# Patient Record
Sex: Female | Born: 1990
Health system: Southern US, Community
[De-identification: ages and names within clinical notes are randomized; demographics above are authoritative.]

## PROBLEM LIST (undated history)

## (undated) DIAGNOSIS — M419 Scoliosis, unspecified: Secondary | ICD-10-CM

## (undated) DIAGNOSIS — T8859XA Other complications of anesthesia, initial encounter: Secondary | ICD-10-CM

## (undated) DIAGNOSIS — I456 Pre-excitation syndrome: Secondary | ICD-10-CM

## (undated) DIAGNOSIS — F32A Depression, unspecified: Secondary | ICD-10-CM

## (undated) DIAGNOSIS — F988 Other specified behavioral and emotional disorders with onset usually occurring in childhood and adolescence: Secondary | ICD-10-CM

## (undated) DIAGNOSIS — D170 Benign lipomatous neoplasm of skin and subcutaneous tissue of head, face and neck: Secondary | ICD-10-CM

## (undated) DIAGNOSIS — Z9889 Other specified postprocedural states: Secondary | ICD-10-CM

## (undated) DIAGNOSIS — A419 Sepsis, unspecified organism: Secondary | ICD-10-CM

## (undated) DIAGNOSIS — F329 Major depressive disorder, single episode, unspecified: Secondary | ICD-10-CM

## (undated) DIAGNOSIS — R112 Nausea with vomiting, unspecified: Secondary | ICD-10-CM

## (undated) HISTORY — PX: COLPOSCOPY: SHX161

## (undated) HISTORY — DX: Pre-excitation syndrome: I45.6

## (undated) HISTORY — DX: Other specified behavioral and emotional disorders with onset usually occurring in childhood and adolescence: F98.8

## (undated) HISTORY — DX: Depression, unspecified: F32.A

## (undated) HISTORY — PX: WISDOM TOOTH EXTRACTION: SHX21

## (undated) HISTORY — DX: Scoliosis, unspecified: M41.9

## (undated) HISTORY — DX: Major depressive disorder, single episode, unspecified: F32.9

---
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
## 2011-01-28 LAB — HM PAP SMEAR

## 2011-03-17 ENCOUNTER — Telehealth: Payer: Self-pay

## 2011-03-17 NOTE — Telephone Encounter (Signed)
Pt in need of dextroamphetamine refill.  Pharmacy choice [facility] on [address].

## 2011-03-17 NOTE — Telephone Encounter (Signed)
Per chart, pt is ok for 2 more months.  Can we rx?

## 2011-03-18 ENCOUNTER — Telehealth: Payer: Self-pay

## 2011-03-18 NOTE — Telephone Encounter (Signed)
Notified pt request is complete and ready.

## 2011-03-18 NOTE — Telephone Encounter (Signed)
LMOM at home and cell that Rx ready to p/up.

## 2011-03-18 NOTE — Telephone Encounter (Signed)
Dextroamphetamine ER #[id number], 2 po BID written.  Please call pt to pick up.

## 2011-04-21 ENCOUNTER — Telehealth: Payer: Self-pay

## 2011-04-21 NOTE — Telephone Encounter (Signed)
Pt would like a refill on dextroamphetamine. Pt will pick up.

## 2011-04-22 ENCOUNTER — Telehealth: Payer: Self-pay

## 2011-04-22 MED ORDER — AMPHETAMINE-DEXTROAMPHET ER 10 MG PO CP24: 20.0000 mg | ORAL_CAPSULE | Freq: Two times a day (BID) | ORAL | Status: DC

## 2011-04-22 NOTE — Telephone Encounter (Signed)
Signed order for meds

## 2011-04-22 NOTE — Telephone Encounter (Signed)
Added this message with new message 04/22/11

## 2011-04-22 NOTE — Telephone Encounter (Signed)
This is a previous message Suzanne [name] March 04/21/2011 1:32 PM Signed  Pt would like a refill on dextroamphetamine.  Pt will pick up.

## 2011-04-22 NOTE — Telephone Encounter (Signed)
[facility]  PT STATES SHE IS COMING FROM [place] AND IS IN NEED OF HER DEXTO 4:00 PLEASE CALL [phone]

## 2011-04-22 NOTE — Telephone Encounter (Signed)
Pt seen for this 11/21/10, last RF 02/14/10. Pt is in lobby waiting for this

## 2011-04-23 ENCOUNTER — Telehealth: Payer: Self-pay

## 2011-04-23 NOTE — Telephone Encounter (Signed)
Patient picked up RX yesterday and it was wrong. Should have been regular but it was generic. Wants [facility]generic [facility] [place] [facility] she lives. Phone # is [phone].

## 2011-04-24 MED ORDER — DEXTROAMPHETAMINE SULFATE ER 10 MG PO CP24: 20.0000 mg | ORAL_CAPSULE | Freq: Two times a day (BID) | ORAL | Status: DC

## 2011-04-24 NOTE — Telephone Encounter (Signed)
Left message to call back  

## 2011-04-24 NOTE — Telephone Encounter (Signed)
Pt was accidentally given Adderall and she had them filled and will take then until she returns to [place] - at that time she will return the unused portion of adderall to [place] for disposal and she can get a new RX for the correct medication, dexedrine ER.  She will need an OV 4/13.

## 2011-04-24 NOTE — Telephone Encounter (Signed)
Pt reports she already had the Adderall filled, a friend p/up for her and didn't realize that it wasn't the Dextroamphetamine ER. Pt will take the amphetamine salts (Adderall) for now and should be in town in about 7 days. If we can write her a new Rx for the correct medication, she will bring what is left of her Adderall Rx and give it to [place] to exchange for the month's script for her Dextro.

## 2011-05-24 ENCOUNTER — Telehealth: Payer: Self-pay

## 2011-05-24 NOTE — Telephone Encounter (Signed)
Patient is requesting a refill on dextroanphetamine please call once it has been written she needs to pick this up  Best number [phone]

## 2011-05-25 NOTE — Telephone Encounter (Signed)
LMOM TO CB.  PT NEEDS OFFICE VISIT

## 2011-05-26 NOTE — Telephone Encounter (Signed)
Advised pt that she would need an office visit

## 2011-05-28 ENCOUNTER — Ambulatory Visit (INDEPENDENT_AMBULATORY_CARE_PROVIDER_SITE_OTHER): Payer: BC Managed Care – PPO | Admitting: Internal Medicine

## 2011-05-28 VITALS — BP 115/70 | HR 112 | Temp 98.1°F | Resp 16 | Ht 65.5 in | Wt 99.8 lb

## 2011-05-28 DIAGNOSIS — F988 Other specified behavioral and emotional disorders with onset usually occurring in childhood and adolescence: Secondary | ICD-10-CM

## 2011-05-28 DIAGNOSIS — F329 Major depressive disorder, single episode, unspecified: Secondary | ICD-10-CM

## 2011-05-28 DIAGNOSIS — F909 Attention-deficit hyperactivity disorder, unspecified type: Secondary | ICD-10-CM

## 2011-05-28 MED ORDER — AMPHETAMINE-DEXTROAMPHET ER 10 MG PO CP24: 10.0000 mg | ORAL_CAPSULE | ORAL | Status: DC

## 2011-05-28 MED ORDER — ESCITALOPRAM OXALATE 10 MG PO TABS: 10.0000 mg | ORAL_TABLET | Freq: Every day | ORAL | Status: DC

## 2011-05-28 NOTE — Patient Instructions (Signed)
Take meds as directed see a therapist in followup in [place]. If you feel that you are are danger to yourself or anyone else return to the er

## 2011-05-28 NOTE — Progress Notes (Signed)
  Subjective:    Patient ID: Suzanne [name], female    DOB: 04-17-1990, 21 y.o.   MRN: [id number]  HPI Here for refill for adhd meds. Comes monthly. Also wants refill of lexapro has not taken it for a whild but has been dysphoric. Not suicidal or homocidal  Review of Systems  Constitutional: Negative.   HENT: Negative.   Respiratory: Negative.   Gastrointestinal: Negative.   Genitourinary: Negative.   Musculoskeletal: Negative.   Neurological: Negative.   Psychiatric/Behavioral: Positive for dysphoric mood. Negative for behavioral problems, confusion, decreased concentration and agitation.  All other systems reviewed and are negative.       Objective:   Physical Exam  Nursing note and vitals reviewed. Constitutional: She is oriented to person, place, and time. She appears well-developed and well-nourished.  HENT:  Head: Normocephalic and atraumatic.  Right Ear: External ear normal.  Left Ear: External ear normal.  Eyes: Conjunctivae and EOM are normal. Pupils are equal, round, and reactive to light.  Neck: Normal range of motion. Neck supple.  Cardiovascular: Normal rate, regular rhythm and normal heart sounds.   Pulmonary/Chest: Effort normal and breath sounds normal.  Abdominal: Soft. Bowel sounds are normal.  Musculoskeletal: Normal range of motion.  Neurological: She is alert and oriented to person, place, and time. She has normal reflexes.  Skin: Skin is warm and dry.  Psychiatric: She has a normal mood and affect. Her behavior is normal. Judgment and thought content normal.          Assessment & Plan:  Renew amphetamines restart lexapro to followup with therapist in [place]

## 2011-05-28 NOTE — Progress Notes (Signed)
Addended by: Glennie Isle on: 05/28/2011 08:38 PM   Modules accepted: Orders

## 2011-06-20 ENCOUNTER — Other Ambulatory Visit: Payer: Self-pay

## 2011-06-20 DIAGNOSIS — F909 Attention-deficit hyperactivity disorder, unspecified type: Secondary | ICD-10-CM

## 2011-06-20 DIAGNOSIS — F329 Major depressive disorder, single episode, unspecified: Secondary | ICD-10-CM

## 2011-06-20 NOTE — Telephone Encounter (Signed)
Pt is requesting on narcotic dextra actephenimine

## 2011-06-20 NOTE — Telephone Encounter (Signed)
Spoke with patient and notified below.  She states that she lives in [place] and is only able to come into town once a month.  She does not have drivers license and father will bring her in on Monday/Tuesday.  Can we rx this with later fill date and have her pick up on Monday?  Patient also states that she does not have a therapist in [place], and doesn't plan on getting one.  She wants [place] to continue rxing her meds.  To PA.

## 2011-06-20 NOTE — Telephone Encounter (Signed)
Too early for refill  

## 2011-06-21 MED ORDER — AMPHETAMINE-DEXTROAMPHET ER 10 MG PO CP24: 10.0000 mg | ORAL_CAPSULE | ORAL | Status: DC

## 2011-06-21 MED ORDER — ESCITALOPRAM OXALATE 10 MG PO TABS: 10.0000 mg | ORAL_TABLET | Freq: Every day | ORAL | Status: DC

## 2011-06-21 NOTE — Telephone Encounter (Signed)
Rx for Adderall XR 10 mg and Lexapro 10 mg printed.  Patient will need to follow-up here in 3 months, or establish for care in [facility].

## 2011-06-21 NOTE — Telephone Encounter (Signed)
[facility] notifying patient below, and rx's in pickup drawer.

## 2011-06-22 ENCOUNTER — Telehealth: Payer: Self-pay

## 2011-06-22 DIAGNOSIS — F909 Attention-deficit hyperactivity disorder, unspecified type: Secondary | ICD-10-CM

## 2011-06-22 DIAGNOSIS — F329 Major depressive disorder, single episode, unspecified: Secondary | ICD-10-CM

## 2011-06-22 NOTE — Telephone Encounter (Signed)
Pt was prescribed the wrong dosage of dextroamphetamine. Pt states she does not take Adderall because it doesn't work as well as the dextroamphetamine. Pt states that she was only given #30 but would like to have #120 because she sometimes takes TID. Chart at nurses station.

## 2011-06-23 NOTE — Telephone Encounter (Signed)
Yes we can correct this but I need to know if she has already filled the Adderall that was written on 06/21/11? Or did she notice this on just the hard copy?  Whatever she has she needs to bring in so we can discard either the tablets or the hard copy RX.

## 2011-06-24 NOTE — Telephone Encounter (Signed)
Left message for patient to return call.

## 2011-06-25 NOTE — Telephone Encounter (Signed)
Pt wants her rx to say dextroamphetamine er 10mg  tid.  Her chart is at nurses station for review and she never picked up rx's from 5/10. [place] [id number]

## 2011-06-26 MED ORDER — ESCITALOPRAM OXALATE 10 MG PO TABS: 10.0000 mg | ORAL_TABLET | Freq: Every day | ORAL | Status: DC

## 2011-06-26 MED ORDER — AMPHETAMINE-DEXTROAMPHET ER 10 MG PO CP24: ORAL_CAPSULE | ORAL | Status: DC

## 2011-06-26 NOTE — Telephone Encounter (Signed)
Advised pt that rx is ready for pick up.

## 2011-06-28 ENCOUNTER — Telehealth: Payer: Self-pay

## 2011-06-28 NOTE — Telephone Encounter (Signed)
[facility] calling to let nurse or Dr know that a rx auth needs to be filled out for pt adderall xr 10mg  3 TID insurance will only allow 1 a day. Please call [phone]

## 2011-07-01 NOTE — Telephone Encounter (Signed)
Prior auth form filled out and faxed into [employer] [place].

## 2011-07-26 ENCOUNTER — Telehealth: Payer: Self-pay

## 2011-07-26 DIAGNOSIS — F909 Attention-deficit hyperactivity disorder, unspecified type: Secondary | ICD-10-CM

## 2011-07-26 NOTE — Telephone Encounter (Signed)
PT IN NEED OF HER ADDERALL,. PLEASE CALL [phone] WHEN READY AND WOULD LIKE TO HAVE BETWEEN Sunday AND Tuesday

## 2011-07-27 MED ORDER — AMPHETAMINE-DEXTROAMPHET ER 10 MG PO CP24: ORAL_CAPSULE | ORAL | Status: DC

## 2011-07-27 NOTE — Telephone Encounter (Signed)
RX refilled and is at [facility]

## 2011-07-28 NOTE — Telephone Encounter (Signed)
LMOM THAT RX IS READY FOR PICKUP 

## 2011-08-05 ENCOUNTER — Telehealth: Payer: Self-pay

## 2011-08-05 NOTE — Telephone Encounter (Signed)
PT STATES SHE HAVE BEEN HAVING TROUBLE WITH [place] CALLING IN HER MEDICINE CORRECTLY AND NEED THIS DONE TODAY STATES IT IS THE DEXTRO AND WE KEEP WRITING IT FOR THE ADDERALL. PLEASE CALL PT AT [phone]    [facility] [place] AT [phone]

## 2011-08-06 MED ORDER — DEXTROAMPHETAMINE SULFATE ER 10 MG PO CP24: ORAL_CAPSULE | ORAL | Status: DC

## 2011-08-06 NOTE — Telephone Encounter (Signed)
Done and ready for pick up

## 2011-08-06 NOTE — Telephone Encounter (Signed)
Corrected Rx is ready for p/up. Tried to call pt - H# has been disconnected and cell VM was full. Try to call pt again and make sure she doesn't want [place] to mail it since she has enough Adderall for 10 days.

## 2011-08-06 NOTE — Telephone Encounter (Signed)
Advised pt that Rx is corrected and ready. Pt does request Rx be mailed to her at her brothers apt in [place] - [address], [place], [address]. Mailed Rx.

## 2011-08-06 NOTE — Telephone Encounter (Signed)
Spoke w/ pt who is aware that she has been getting the Adderall ER (dextro-amphetamine comb) for several months and hasn't wanted to make the trip back from [place] to p/up a correction. She really does want to return to the plain Dextroamphetamine ER that she was taking before and works better for her. Called her pharmacy to ask about the difference and how we can put in computer to get her the correct info. He stated that the correct Rx would be for Dextroamphetamine sulfate ER or Dexedrine Spansules (the Spansules are the ER capsule) as long as we sign to allow substitution of generic. Pt p/up #30 (10 days worth) of the Adderall on 08/05/11 to last her until we can get the Rx straightened out. [place] [id number] if needed.

## 2011-09-09 ENCOUNTER — Telehealth: Payer: Self-pay

## 2011-09-09 NOTE — Telephone Encounter (Signed)
The patient called to request refill of Dexedrine Sansule 10mg  Rx.  The patient requests that Rx be mailed to her home address as she is living in [place], [place] and does not have a vehicle to get to [place] at this time.  Please call patient at [phone] with any questions.

## 2011-09-11 MED ORDER — DEXTROAMPHETAMINE SULFATE ER 10 MG PO CP24: 20.0000 mg | ORAL_CAPSULE | Freq: Two times a day (BID) | ORAL | Status: DC

## 2011-09-11 NOTE — Telephone Encounter (Signed)
Spoke w/pt to verify what address Rx needs to be mailed to which is [address], [place] [address]. Reminded pt that she does need to plan to get to town in order to have OV w/provider Q 6 mos which would be before her Oct RF is due. Pt agreed and stated she can get here that often, it is just hard to come Q mos to p/up Rx. Mailed Rx to address above.

## 2011-09-11 NOTE — Telephone Encounter (Signed)
Rx printed and ready to be mailed.  If she is going to be living in [facility] and unable to come here for follow up, she will need to find a PCP there who can rx her medication.

## 2011-10-16 ENCOUNTER — Telehealth: Payer: Self-pay

## 2011-10-16 NOTE — Telephone Encounter (Signed)
PT IN NEED OF HER DEXTRO BEING MAILED TO HER. PLEASE CALL [phone]

## 2011-10-17 ENCOUNTER — Other Ambulatory Visit: Payer: Self-pay | Admitting: Physician Assistant

## 2011-10-17 DIAGNOSIS — F909 Attention-deficit hyperactivity disorder, unspecified type: Secondary | ICD-10-CM

## 2011-10-17 MED ORDER — AMPHETAMINE-DEXTROAMPHET ER 10 MG PO CP24: ORAL_CAPSULE | ORAL | Status: DC

## 2011-10-17 NOTE — Telephone Encounter (Signed)
I have called her to see what medication she is in need of. Left message for call back.

## 2011-10-17 NOTE — Telephone Encounter (Signed)
Dexedrine Sansule 10mg  Rx patient states she is taking 2 bid, but our last Rx is written for one tid. Please advise on renewal

## 2011-10-17 NOTE — Telephone Encounter (Signed)
Called pt to advise RX being mailed, [facility] to notify pt

## 2011-10-17 NOTE — Telephone Encounter (Signed)
Rx is ready to mail.  The last Rx was for tid use, so that's what I can approve.

## 2011-10-26 ENCOUNTER — Telehealth: Payer: Self-pay

## 2011-10-26 DIAGNOSIS — F909 Attention-deficit hyperactivity disorder, unspecified type: Secondary | ICD-10-CM

## 2011-10-26 NOTE — Telephone Encounter (Signed)
[address] [place] [address]  [id number]  Please mail another RX to patient at this address:   Not the one in the computer.  adderall rx

## 2011-10-27 MED ORDER — AMPHETAMINE-DEXTROAMPHET ER 10 MG PO CP24: ORAL_CAPSULE | ORAL | Status: DC

## 2011-10-27 NOTE — Telephone Encounter (Signed)
At TL desk 

## 2011-10-27 NOTE — Telephone Encounter (Signed)
Done and mailed

## 2011-11-01 ENCOUNTER — Telehealth: Payer: Self-pay | Admitting: Radiology

## 2011-11-01 NOTE — Telephone Encounter (Signed)
We will consider the change once we get the original Adderall Rx back. We cannot write for "extra" tabs. I will not write controlled substances for this patient.

## 2011-11-01 NOTE — Telephone Encounter (Signed)
Patient needs Dexidrine, not Adderall mailed to  [address] [place]. [address]/ She has advised her insurance does not cover the Adderall. She will mail me the Adderall back, I advised if not mailed back, we could file a report. She wanted Dexidrine written for 4 a day instead of 3 a day so she could have extra, I advised her this can not be done, we have to write as she is directed to take which is three a day.

## 2011-11-06 NOTE — Telephone Encounter (Signed)
If you get mail from this patient please get this to me so I can work on alternative medication for her.

## 2011-11-08 NOTE — Telephone Encounter (Signed)
We have not received this in the mail yet but will keep my eyes open for it.

## 2011-11-12 MED ORDER — DEXTROAMPHETAMINE SULFATE ER 10 MG PO CP24: ORAL_CAPSULE | ORAL | Status: DC

## 2011-11-12 NOTE — Telephone Encounter (Signed)
Mailed her the Rx, called patient and advised she is due for follow up, and it may be best if she gets this done at [facility]. She is angry her Rx was not mailed to her last week. I advised her I had to wait until the Rx for Adderall was received. She advised she will not return here. I have advised her to let us know if her medical records need to be sent, our medical records department would be happy to send these for her. FYI only

## 2011-11-12 NOTE — Telephone Encounter (Signed)
Patient has mailed back the Rx for Adderall, I have torn it up and put in shred box. Please advise if we can mail Rx for her Dexedrine Spansules 10mg , tid. Also there has been discussion about patient finding a provider in [place], as she now lives there. She is due for follow up in Oct.

## 2011-11-12 NOTE — Telephone Encounter (Signed)
I have written the Rx for tid.  She will need to find a provider in [place] for her next Rx.

## 2012-04-01 ENCOUNTER — Ambulatory Visit (INDEPENDENT_AMBULATORY_CARE_PROVIDER_SITE_OTHER): Payer: BC Managed Care – PPO | Admitting: Family Medicine

## 2012-04-01 VITALS — HR 120 | Temp 98.0°F | Resp 18 | Ht 65.5 in | Wt 107.0 lb

## 2012-04-01 DIAGNOSIS — F988 Other specified behavioral and emotional disorders with onset usually occurring in childhood and adolescence: Secondary | ICD-10-CM

## 2012-04-01 DIAGNOSIS — Z309 Encounter for contraceptive management, unspecified: Secondary | ICD-10-CM

## 2012-04-01 MED ORDER — DEXTROAMPHETAMINE SULFATE ER 10 MG PO CP24: 20.0000 mg | ORAL_CAPSULE | Freq: Two times a day (BID) | ORAL | Status: DC

## 2012-04-01 MED ORDER — MEDROXYPROGESTERONE ACETATE 150 MG/ML IM SUSP
150.0000 mg | Freq: Once | INTRAMUSCULAR | Status: AC
  Administered 2012-04-01: 150 mg via INTRAMUSCULAR

## 2012-04-01 NOTE — Patient Instructions (Signed)
Recheck in 3 months for repeat depoprovera and to discuss ADD medicine.  Return to the clinic or go to the nearest emergency room if any of your symptoms worsen or new symptoms occur. [facility] Policy for Prescribing Controlled Substances (Revised 12/2011) 1. Prescriptions for controlled substances will be filled by ONE provider at [facility] with whom you have established and developed a plan for your care, including follow-up. 2. You are encouraged to schedule an appointment with your prescriber at our appointment center for follow-up visits whenever possible. 3. If you request a prescription for the controlled substance while at [facility] for an acute problem (with someone other than your regular prescriber), you MAY be given a ONE-TIME prescription for a 30-day supply of the controlled substance, to allow time for you to return to see your regular prescriber for additional prescriptions.

## 2012-04-01 NOTE — Progress Notes (Signed)
Subjective:    Patient ID: Suzanne [name], female    DOB: 28-Feb-1990, 21 y.o.   MRN: [id number]  HPI [name] is a 22 y.o. female  Moved back to [place] few weeks ago form [place]. Broke up with boyfriend - happy about this. Plans on continuing care here.   Hx of ADHD - last ov 05/2011 with Dr. Mindi Junker. Multiple providers rx med here prior at her office visits.  Phone note from September indicated she was planning on receiving med/care elsewhere.  Takes detroamphetamine 10mg  tid prn - last rx on [facility] 11/12/11 for #90, but on controlled substance database - #120 on 02/18/12 prescribed by Helyn App at [facility] in [place]. (prior to that 12/31/11). Feels like 2 pills twice per day is a good dose - 2 in the morning, then 1 or 2 at lunch. Feels like focus good on this.  Had 2 jobs in [place].  Working well.  No insomnia. Normal appetite - no weight loss.    Also noted depression at 05/2011 office visit.  Restarted Lexapro at that time for 1 month.  Didn't feel like needed it. No depressed mood, no SI, no anhedonia. Feels like mood is good.  Alcohol - once per week - 2-3 drinks. Decreased consumption.  No recent DUI or problems with work due to alcohol.  DUI 2 years ago.  Tobacco 2 cigs/day. No marijuana or other IDU.   Contraceptive couseling - here for Depo injection.  Had prescribed form doctor in [facility] - has with her - picked up yesterday. Last injected in November - 11/1/713.  Does not have breakthrough bleeding/menses on this medicine. Due for repeat injection 2 days ago.  No recent unprotected intercourse. Has rx with her, but unable to go back to [facility] to have injected.    Review of Systems  Constitutional: Negative for chills and unexpected weight change.  Respiratory: Negative for chest tightness.   Cardiovascular: Negative for chest pain and palpitations.  Genitourinary: Negative for vaginal bleeding and menstrual problem.  Psychiatric/Behavioral:  Negative for suicidal ideas, sleep disturbance, dysphoric mood and decreased concentration. The patient is not nervous/anxious and is not hyperactive.    As above.     Objective:   Physical Exam  Constitutional: She is oriented to person, place, and time. She appears well-developed and well-nourished.  HENT:  Head: Normocephalic and atraumatic.  Cardiovascular: Normal rate, regular rhythm, normal heart sounds and intact distal pulses.   Pulmonary/Chest: Effort normal and breath sounds normal.  Abdominal: Soft. There is no tenderness.  Neurological: She is alert and oriented to person, place, and time.  Skin: Skin is warm and dry.  Psychiatric: She has a normal mood and affect. Her behavior is normal. Judgment and thought content normal.      Assessment & Plan:  [name] is a 23 y.o. female ADD (attention deficit disorder) - controlled. No new side effects. csrs matches hx. No concerns. Advised of controlled substance policy. Plan: dextroamphetamine at same dose - 2 of 10mg  er twice per day.  Initial rx's with wrong instructions and one of prescriptions signed by other provider. Shred 5 erroneous prescriptions. Recheck in 3 months.   Other general counseling and advice for contraceptive management. Depo preovera given.   Unspecified contraceptive management - Plan: medroxyPROGESTERone (DEPO-PROVERA) injection 150 mg   Patient Instructions  Recheck in 3 months for repeat depoprovera and to discuss ADD medicine.  Return to the clinic or go to the nearest emergency  room if any of your symptoms worsen or new symptoms occur. [facility] Policy for Prescribing Controlled Substances (Revised 12/2011) 1. Prescriptions for controlled substances will be filled by ONE provider at [facility] with whom you have established and developed a plan for your care, including follow-up. 2. You are encouraged to schedule an appointment with your prescriber at our appointment center for follow-up visits whenever  possible. 3. If you request a prescription for the controlled substance while at [facility] for an acute problem (with someone other than your regular prescriber), you MAY be given a ONE-TIME prescription for a 30-day supply of the controlled substance, to allow time for you to return to see your regular prescriber for additional prescriptions.    Meds ordered this encounter  Medications  . DISCONTD: dextroamphetamine (DEXEDRINE SPANSULE) 10 MG 24 hr capsule    Sig: Take 2 capsules (20 mg total) by mouth 2 (two) times daily. Take one capsule three times daily as needed.    Dispense:  120 capsule    Refill:  0  . DISCONTD: dextroamphetamine (DEXEDRINE SPANSULE) 10 MG 24 hr capsule    Sig: Take 2 capsules (20 mg total) by mouth 2 (two) times daily. Take one capsule three times daily as needed.    Dispense:  120 capsule    Refill:  0    Do not fill before 04/29/12  . DISCONTD: dextroamphetamine (DEXEDRINE SPANSULE) 10 MG 24 hr capsule    Sig: Take 2 capsules (20 mg total) by mouth 2 (two) times daily. Take one capsule three times daily as needed.    Dispense:  120 capsule    Refill:  0    Do not fill before 05/30/12  . medroxyPROGESTERone (DEPO-PROVERA) injection 150 mg    Sig:   . DISCONTD: dextroamphetamine (DEXEDRINE SPANSULE) 10 MG 24 hr capsule    Sig: Take 2 capsules (20 mg total) by mouth 2 (two) times daily. Take one capsule three times daily as needed.    Dispense:  120 capsule    Refill:  0    Do not fill before 05/30/12  . DISCONTD: dextroamphetamine (DEXEDRINE SPANSULE) 10 MG 24 hr capsule    Sig: Take 2 capsules (20 mg total) by mouth 2 (two) times daily.    Dispense:  120 capsule    Refill:  0    Do not fill before 05/30/12  . DISCONTD: dextroamphetamine (DEXEDRINE SPANSULE) 10 MG 24 hr capsule    Sig: Take 2 capsules (20 mg total) by mouth 2 (two) times daily.    Dispense:  120 capsule    Refill:  0    Do not fill before 04/29/12  . dextroamphetamine (DEXEDRINE SPANSULE) 10 MG  24 hr capsule    Sig: Take 2 capsules (20 mg total) by mouth 2 (two) times daily.    Dispense:  120 capsule    Refill:  0

## 2012-06-08 ENCOUNTER — Telehealth: Payer: Self-pay | Admitting: Certified Nurse Midwife

## 2012-06-08 ENCOUNTER — Ambulatory Visit: Payer: BC Managed Care – PPO | Admitting: Gynecology

## 2012-06-08 NOTE — Telephone Encounter (Signed)
Pt would like an appt to evaluate vaginal bumps and dry skin.

## 2012-06-08 NOTE — Telephone Encounter (Signed)
pateint given appt. To be seen tomorrow with Dr. Farrel Gobble @ 11:00am

## 2012-06-09 ENCOUNTER — Encounter: Payer: Self-pay | Admitting: Gynecology

## 2012-06-09 ENCOUNTER — Ambulatory Visit (INDEPENDENT_AMBULATORY_CARE_PROVIDER_SITE_OTHER): Payer: BC Managed Care – PPO | Admitting: Gynecology

## 2012-06-09 VITALS — BP 100/60 | Resp 14 | Wt 101.0 lb

## 2012-06-09 DIAGNOSIS — R238 Other skin changes: Secondary | ICD-10-CM

## 2012-06-09 DIAGNOSIS — N760 Acute vaginitis: Secondary | ICD-10-CM

## 2012-06-09 DIAGNOSIS — L988 Other specified disorders of the skin and subcutaneous tissue: Secondary | ICD-10-CM

## 2012-06-09 LAB — POCT WET PREP (WET MOUNT): Clue Cells Wet Prep Whiff POC: NEGATIVE

## 2012-06-09 NOTE — Patient Instructions (Signed)
Recommend keeping area dry, loose fitting clothing, cotton underwear, condoms if sexually active.  Avoid sexual contact until area has healed and culture results are back.   Return to office if symptoms worsen

## 2012-06-09 NOTE — Progress Notes (Signed)
Subjective:     Patient ID: Suzanne [name], female   DOB: 1990/08/26, 22 y.o.   MRN: [id number]  HPI Comments: Pt reports vaginal itching and vulvar rash that began 10 ago.  Pt reports having unprotected sex with a new partner around that time.  Pt unsure regarding his STD risks.  Pt also reports recently not wearing underwear off and on last week, while wearing jeans.  Jeans were tight fitting and she is unsure regarding local abrasion.  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  Pt reports 3d history of chills, sore throat and fatigue,  But denies any oral sex.      Review of Systems  Constitutional: Positive for chills and fatigue. Negative for fever, activity change and appetite change.  HENT: Negative for nosebleeds, congestion and postnasal drip.   Respiratory: Negative.   Cardiovascular: Negative.   Gastrointestinal: Negative.   Genitourinary: Negative for dysuria, hematuria, genital sores and pelvic pain.  Musculoskeletal: Negative.        Objective:   Physical Exam  Constitutional: She appears well-developed and well-nourished.  Genitourinary:     Pelvic: cervix normal in appearance, external genitalia normal, no adnexal masses or tenderness, no bladder tenderness, no cervical motion tenderness, rectovaginal septum normal, uterus normal size, shape, and consistency and thick dischrage invault Vulva and mons shaved     Assessment:     Possible Herpetic outbreak vs local irritation due to improper hygiene  Wet prep negative    Plan:     HSV culture of lesion on mons HSV IGG IGM I/II    ADD:  Pt refused phlebototomy, stressed importance of knowing for sure, she is due for depo next week will try again

## 2012-06-29 ENCOUNTER — Ambulatory Visit (INDEPENDENT_AMBULATORY_CARE_PROVIDER_SITE_OTHER): Payer: BC Managed Care – PPO

## 2012-06-29 DIAGNOSIS — Z3040 Encounter for surveillance of contraceptives, unspecified: Secondary | ICD-10-CM

## 2012-06-29 MED ORDER — MEDROXYPROGESTERONE ACETATE 150 MG/ML IM SUSP
150.0000 mg | Freq: Once | INTRAMUSCULAR | Status: AC
  Administered 2012-06-29: 150 mg via INTRAMUSCULAR

## 2012-06-29 NOTE — Progress Notes (Signed)
Pt presented for Depo Provera injection.  Pt had AVS from [facility] documenting last injection.  Last injection given on 04/01/12.  Injection tolerated well in left gluteal.  Pt should return between 09/14/12-09/28/12 for next injection.  Pt supplied her own medication.  Pt adamantly declined venipuncture for HSV test.

## 2012-07-21 ENCOUNTER — Ambulatory Visit (INDEPENDENT_AMBULATORY_CARE_PROVIDER_SITE_OTHER): Payer: BC Managed Care – PPO | Admitting: Family Medicine

## 2012-07-21 VITALS — BP 134/84 | HR 105 | Temp 97.7°F | Resp 16 | Ht 65.25 in | Wt 101.0 lb

## 2012-07-21 DIAGNOSIS — F988 Other specified behavioral and emotional disorders with onset usually occurring in childhood and adolescence: Secondary | ICD-10-CM

## 2012-07-21 MED ORDER — DEXTROAMPHETAMINE SULFATE ER 10 MG PO CP24: 20.0000 mg | ORAL_CAPSULE | Freq: Two times a day (BID) | ORAL | Status: DC

## 2012-07-21 NOTE — Progress Notes (Signed)
Subjective:    Patient ID: Suzanne [name], female    DOB: 10/23/1990, 22 y.o.   MRN: [id number]  HPI Suzanne [name] is a 22 y.o. female  See last ov 04/01/12  ADD - controlled at discussion last OV. Continued dextroamphetamine at same dose - 2 of 10mg  er twice per day. Had just moved back to [place] from [place]. Prior had 2 jobs in [place]. Last Rx filled April.  Ran out of meds last month. 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  Will take over business in a month.  Was taking 2 Dexedrine in am, then 1 or 2 in afternoon depending on how late working. No insomnia. No depressed mood.  Rare heart racing if not drinking enough water or working too much. Weight 107 to 101. Eating 2 meals per day. Sometimes breakfast.  Snacking during day.   noted depression at 05/2011 office visit, but only took Lexapro at that time for 1 month.  Didn't feel like needed it. No depressed mood, no SI, no anhedonia.    Alcohol - once per week, not driving. 3 drinks at a time.  No recent DUI or problems with work due to alcohol or hangover.  DUI 2 years ago. Also had driving with license with revoked charge - possible another year without license. Tobacco 2 cigs/day. No marijuana or other IDU.   Contraceptive couseling - depo at [facility] on May 20th.   Review of Systems  Constitutional: Negative for appetite change and unexpected weight change.  Cardiovascular: Positive for palpitations (rare - as above. ). Negative for chest pain.  Psychiatric/Behavioral: Negative for suicidal ideas, sleep disturbance and dysphoric mood. The patient is not nervous/anxious.        Objective:   Physical Exam  Vitals reviewed. Constitutional: She is oriented to person, place, and time. She appears well-developed and well-nourished. No distress.  Thin appearance, but no lanugo, no temporal wasting, no parotid enlargement. Denies eating d/o.   HENT:  Head: Normocephalic and atraumatic.  Eyes:  Conjunctivae and EOM are normal. Pupils are equal, round, and reactive to light.  Neck: Carotid bruit is not present.  Cardiovascular: Normal rate, regular rhythm, normal heart sounds and intact distal pulses.   Pulmonary/Chest: Effort normal and breath sounds normal.  Abdominal: Soft. She exhibits no pulsatile midline mass. There is no tenderness.  Neurological: She is alert and oriented to person, place, and time.  Skin: Skin is warm and dry.  Psychiatric: She has a normal mood and affect. Her behavior is normal. Judgment and thought content normal.        Assessment & Plan:  [name] is a 22 y.o. female ADD (attention deficit disorder) - Plan: dextroamphetamine (DEXEDRINE SPANSULE) 10 MG 24 hr capsule, DISCONTINUED: dextroamphetamine (DEXEDRINE SPANSULE) 10 MG 24 hr capsule, DISCONTINUED: dextroamphetamine (DEXEDRINE SPANSULE) 10 MG 24 hr capsule   ADD - overall controlled. Discussed increasing fluids during day and especially with work to decrease palpitations - rtc to recheck if persistent. Also discussed 6 pound weight loss - needs to increase caloric intake, including breakfast. recheck in 3 months. 3 months meds prescribed.   Hx of DUI - denies recent alcohol problems or change in intake, legal problems, or addiction. rtc to discuss if this changes.   Meds ordered this encounter  Medications  . DISCONTD: dextroamphetamine (DEXEDRINE SPANSULE) 10 MG 24 hr capsule    Sig: Take 2 capsules (20 mg total) by mouth 2 (two) times daily.  Dispense:  120 capsule    Refill:  0  . DISCONTD: dextroamphetamine (DEXEDRINE SPANSULE) 10 MG 24 hr capsule    Sig: Take 2 capsules (20 mg total) by mouth 2 (two) times daily. Do not fill before 08/20/12.    Dispense:  120 capsule    Refill:  0  . dextroamphetamine (DEXEDRINE SPANSULE) 10 MG 24 hr capsule    Sig: Take 2 capsules (20 mg total) by mouth 2 (two) times daily. Do not fill before 09/20/12.    Dispense:  120 capsule    Refill:  0      Patient Instructions  Increase your calorie intake and fluids during the day.  Breakfast everyday.  Recheck in 3 months. Return to the clinic or go to the nearest emergency room if any of your symptoms worsen or new symptoms occur.

## 2012-07-21 NOTE — Patient Instructions (Signed)
Increase your calorie intake and fluids during the day.  Breakfast everyday.  Recheck in 3 months. Return to the clinic or go to the nearest emergency room if any of your symptoms worsen or new symptoms occur.

## 2012-09-14 ENCOUNTER — Ambulatory Visit (INDEPENDENT_AMBULATORY_CARE_PROVIDER_SITE_OTHER): Payer: BC Managed Care – PPO | Admitting: Nurse Practitioner

## 2012-09-14 VITALS — BP 124/82 | HR 124 | Ht 65.0 in | Wt 97.0 lb

## 2012-09-14 DIAGNOSIS — Z3009 Encounter for other general counseling and advice on contraception: Secondary | ICD-10-CM

## 2012-09-14 NOTE — Progress Notes (Signed)
Pt arrived for Depo Provera injection.   Last AEX - 01/28/11 Last Depo Provera Given - 06/29/12  Discussed pt with P. Berneice Gandy.  Pt is overdue for AEX.  Pt needs to have AEX with Pap before Depo can be given.  Pt's Depo is due by 09/28/12. I tried to explained to pt that we were unable to give injection today as she is overdue for AEX.  Pt states she was just here a couple of months ago and "they looked at everything they needed to see."  Advised pt that her appt in 05/2012 was for a problem visit and only that issue was discussed at that time.  I tried to explain to pt that she needs a well woman exam each year even if she is not due for her pap that year.  Pt left exam room stating she had to be at the airport and she would go somewhere else.  Pt did come to check out with me and Greta attempted to schedule pt's AEX before 09/28/12.  Pt stated she did not have time to wait and to call her with appt date/time. Edgardo Roys was able to find the patient an appt on 09/25/12 with P. Berneice Gandy.  She will call pt with appt details.

## 2012-09-24 ENCOUNTER — Encounter: Payer: Self-pay | Admitting: *Deleted

## 2012-09-24 NOTE — Progress Notes (Signed)
Encounter reviewed by Dr. Brook Silva.  

## 2012-09-25 ENCOUNTER — Encounter: Payer: Self-pay | Admitting: Nurse Practitioner

## 2012-09-25 ENCOUNTER — Ambulatory Visit: Payer: BC Managed Care – PPO | Admitting: Nurse Practitioner

## 2012-09-28 ENCOUNTER — Ambulatory Visit (INDEPENDENT_AMBULATORY_CARE_PROVIDER_SITE_OTHER): Payer: BC Managed Care – PPO | Admitting: Nurse Practitioner

## 2012-09-28 ENCOUNTER — Encounter: Payer: Self-pay | Admitting: Nurse Practitioner

## 2012-09-28 VITALS — BP 110/54 | HR 66 | Resp 12 | Ht 64.5 in | Wt 96.0 lb

## 2012-09-28 DIAGNOSIS — IMO0001 Reserved for inherently not codable concepts without codable children: Secondary | ICD-10-CM

## 2012-09-28 DIAGNOSIS — Z01419 Encounter for gynecological examination (general) (routine) without abnormal findings: Secondary | ICD-10-CM

## 2012-09-28 DIAGNOSIS — Z3040 Encounter for surveillance of contraceptives, unspecified: Secondary | ICD-10-CM

## 2012-09-28 DIAGNOSIS — Z309 Encounter for contraceptive management, unspecified: Secondary | ICD-10-CM

## 2012-09-28 DIAGNOSIS — Z0000 Encounter for general adult medical examination without abnormal findings: Secondary | ICD-10-CM

## 2012-09-28 MED ORDER — MEDROXYPROGESTERONE ACETATE 150 MG/ML IM SUSP: 150.0000 mg | INTRAMUSCULAR | Status: DC

## 2012-09-28 MED ORDER — MEDROXYPROGESTERONE ACETATE 150 MG/ML IM SUSP
150.0000 mg | Freq: Once | INTRAMUSCULAR | Status: AC
  Administered 2012-09-28: 150 mg via INTRAMUSCULAR

## 2012-09-28 NOTE — Patient Instructions (Addendum)
General topics  Next pap or exam is  due in 1 year Take a Women's multivitamin Take 1200 mg. of calcium daily - prefer dietary If any concerns in interim to call back  Breast Self-Awareness Practicing breast self-awareness may pick up problems early, prevent significant medical complications, and possibly save your life. By practicing breast self-awareness, you can become familiar with how your breasts look and feel and if your breasts are changing. This allows you to notice changes early. It can also offer you some reassurance that your breast health is good. One way to learn what is normal for your breasts and whether your breasts are changing is to do a breast self-exam. If you find a lump or something that was not present in the past, it is best to contact your caregiver right away. Other findings that should be evaluated by your caregiver include nipple discharge, especially if it is bloody; skin changes or reddening; areas where the skin seems to be pulled in (retracted); or new lumps and bumps. Breast pain is seldom associated with cancer (malignancy), but should also be evaluated by a caregiver. BREAST SELF-EXAM The best time to examine your breasts is 5 7 days after your menstrual period is over.  ExitCare Patient Information 2013 [employer].   Exercise to Stay Healthy Exercise helps you become and stay healthy. EXERCISE IDEAS AND TIPS Choose exercises that:  You enjoy.  Fit into your day. You do not need to exercise really hard to be healthy. You can do exercises at a slow or medium level and stay healthy. You can:  Stretch before and after working out.  Try yoga, Pilates, or tai chi.  Lift weights.  Walk fast, swim, jog, run, climb stairs, bicycle, dance, or rollerskate.  Take aerobic classes. Exercises that burn about 150 calories:  Running 1  miles in 15 minutes.  Playing volleyball for 45 to 60 minutes.  Washing and waxing a car for 45 to 60  minutes.  Playing touch football for 45 minutes.  Walking 1  miles in 35 minutes.  Pushing a stroller 1  miles in 30 minutes.  Playing basketball for 30 minutes.  Raking leaves for 30 minutes.  Bicycling 5 miles in 30 minutes.  Walking 2 miles in 30 minutes.  Dancing for 30 minutes.  Shoveling snow for 15 minutes.  Swimming laps for 20 minutes.  Walking up stairs for 15 minutes.  Bicycling 4 miles in 15 minutes.  Gardening for 30 to 45 minutes.  Jumping rope for 15 minutes.  Washing windows or floors for 45 to 60 minutes. Document Released: 03/02/2010 Document Revised: 04/22/2011 Document Reviewed: 03/02/2010 ExitCare Patient Information 2013 [employer].   Other topics ( that may be useful information):    Sexually Transmitted Disease Sexually transmitted disease (STD) refers to any infection that is passed from person to person during sexual activity. This may happen by way of saliva, semen, blood, vaginal mucus, or urine. Common STDs include:  Gonorrhea.  Chlamydia.  Syphilis.  HIV/AIDS.  Genital herpes.  Hepatitis B and C.  Trichomonas.  Human papillomavirus (HPV).  Pubic lice. CAUSES  An STD may be spread by bacteria, virus, or parasite. A person can get an STD by:  Sexual intercourse with an infected person.  Sharing sex toys with an infected person.  Sharing needles with an infected person.  Having intimate contact with the genitals, mouth, or rectal areas of an infected person. SYMPTOMS  Some people may not have any symptoms, but   they can still pass the infection to others. Different STDs have different symptoms. Symptoms include:  Painful or bloody urination.  Pain in the pelvis, abdomen, vagina, anus, throat, or eyes.  Skin rash, itching, irritation, growths, or sores (lesions). These usually occur in the genital or anal area.  Abnormal vaginal discharge.  Penile discharge in men.  Soft, flesh-colored skin growths in the  genital or anal area.  Fever.  Pain or bleeding during sexual intercourse.  Swollen glands in the groin area.  Yellow skin and eyes (jaundice). This is seen with hepatitis. DIAGNOSIS  To make a diagnosis, your caregiver may:  Take a medical history.  Perform a physical exam.  Take a specimen (culture) to be examined.  Examine a sample of discharge under a microscope.  Perform blood test TREATMENT   Chlamydia, gonorrhea, trichomonas, and syphilis can be cured with antibiotic medicine.  Genital herpes, hepatitis, and HIV can be treated, but not cured, with prescribed medicines. The medicines will lessen the symptoms.  Genital warts from HPV can be treated with medicine or by freezing, burning (electrocautery), or surgery. Warts may come back.  HPV is a virus and cannot be cured with medicine or surgery.However, abnormal areas may be followed very closely by your caregiver and may be removed from the cervix, vagina, or vulva through office procedures or surgery. If your diagnosis is confirmed, your recent sexual partners need treatment. This is true even if they are symptom-free or have a negative culture or evaluation. They should not have sex until their caregiver says it is okay. HOME CARE INSTRUCTIONS  All sexual partners should be informed, tested, and treated for all STDs.  Take your antibiotics as directed. Finish them even if you start to feel better.  Only take over-the-counter or prescription medicines for pain, discomfort, or fever as directed by your caregiver.  Rest.  Eat a balanced diet and drink enough fluids to keep your urine clear or pale yellow.  Do not have sex until treatment is completed and you have followed up with your caregiver. STDs should be checked after treatment.  Keep all follow-up appointments, Pap tests, and blood tests as directed by your caregiver.  Only use latex condoms and water-soluble lubricants during sexual activity. Do not use  petroleum jelly or oils.  Avoid alcohol and illegal drugs.  Get vaccinated for HPV and hepatitis. If you have not received these vaccines in the past, talk to your caregiver about whether one or both might be right for you.  Avoid risky sex practices that can break the skin. The only way to avoid getting an STD is to avoid all sexual activity.Latex condoms and dental dams (for oral sex) will help lessen the risk of getting an STD, but will not completely eliminate the risk. SEEK MEDICAL CARE IF:   You have a fever.  You have any new or worsening symptoms. Document Released: 04/20/2002 Document Revised: 04/22/2011 Document Reviewed: 04/27/2010 ExitCare Patient Information 2013 [employer].    Domestic Abuse You are being battered or abused if someone close to you hits, pushes, or physically hurts you in any way. You also are being abused if you are forced into activities. You are being sexually abused if you are forced to have sexual contact of any kind. You are being emotionally abused if you are made to feel worthless or if you are constantly threatened. It is important to remember that help is available. No one has the right to abuse you. PREVENTION OF FURTHER   ABUSE  Learn the warning signs of danger. This varies with situations but may include: the use of alcohol, threats, isolation from friends and family, or forced sexual contact. Leave if you feel that violence is going to occur.  If you are attacked or beaten, report it to the police so the abuse is documented. You do not have to press charges. The police can protect you while you or the attackers are leaving. Get the officer's name and badge number and a copy of the report.  Find someone you can trust and tell them what is happening to you: your caregiver, a nurse, clergy member, close friend or family member. Feeling ashamed is natural, but remember that you have done nothing wrong. No one deserves abuse. Document Released:  01/26/2000 Document Revised: 04/22/2011 Document Reviewed: 04/05/2010 ExitCare Patient Information 2013 [employer].    How Much is Too Much Alcohol? Drinking too much alcohol can cause injury, accidents, and health problems. These types of problems can include:   Car crashes.  Falls.  Family fighting (domestic violence).  Drowning.  Fights.  Injuries.  Burns.  Damage to certain organs.  Having a baby with birth defects. ONE DRINK CAN BE TOO MUCH WHEN YOU ARE:  Working.  Pregnant or breastfeeding.  Taking medicines. Ask your doctor.  Driving or planning to drive. If you or someone you know has a drinking problem, get help from a doctor.  Document Released: 11/24/2008 Document Revised: 04/22/2011 Document Reviewed: 11/24/2008 ExitCare Patient Information 2013 [employer].   Smoking Hazards Smoking cigarettes is extremely bad for your health. Tobacco smoke has over 200 known poisons in it. There are over 60 chemicals in tobacco smoke that cause cancer. Some of the chemicals found in cigarette smoke include:   Cyanide.  Benzene.  Formaldehyde.  Methanol (wood alcohol).  Acetylene (fuel used in welding torches).  Ammonia. Cigarette smoke also contains the poisonous gases nitrogen oxide and carbon monoxide.  Cigarette smokers have an increased risk of many serious medical problems and Smoking causes approximately:  90% of all lung cancer deaths in men.  80% of all lung cancer deaths in women.  90% of deaths from chronic obstructive lung disease. Compared with nonsmokers, smoking increases the risk of:  Coronary heart disease by 2 to 4 times.  Stroke by 2 to 4 times.  Men developing lung cancer by 23 times.  Women developing lung cancer by 13 times.  Dying from chronic obstructive lung diseases by 12 times.  . Smoking is the most preventable cause of death and disease in our society.  WHY IS SMOKING ADDICTIVE?  Nicotine is the chemical  agent in tobacco that is capable of causing addiction or dependence.  When you smoke and inhale, nicotine is absorbed rapidly into the bloodstream through your lungs. Nicotine absorbed through the lungs is capable of creating a powerful addiction. Both inhaled and non-inhaled nicotine may be addictive.  Addiction studies of cigarettes and spit tobacco show that addiction to nicotine occurs mainly during the teen years, when young people begin using tobacco products. WHAT ARE THE BENEFITS OF QUITTING?  There are many health benefits to quitting smoking.   Likelihood of developing cancer and heart disease decreases. Health improvements are seen almost immediately.  Blood pressure, pulse rate, and breathing patterns start returning to normal soon after quitting. QUITTING SMOKING   American Lung Association - 1-800-LUNGUSA  American Cancer Society - 1-800-ACS-2345 Document Released: 03/07/2004 Document Revised: 04/22/2011 Document Reviewed: 11/09/2008 ExitCare Patient Information 2013 ExitCare[employer]   LL[employer].   Stress Management Stress is a state of physical or mental tension that often results from changes in your life or normal routine. Some common causes of stress are:  Death of a loved one.  Injuries or severe illnesses.  Getting fired or changing jobs.  Moving into a new home. Other causes may be:  Sexual problems.  Business or financial losses.  Taking on a large debt.  Regular conflict with someone at home or at work.  Constant tiredness from lack of sleep. It is not just bad things that are stressful. It may be stressful to:  Win the lottery.  Get married.  Buy a new car. The amount of stress that can be easily tolerated varies from person to person. Changes generally cause stress, regardless of the types of change. Too much stress can affect your health. It may lead to physical or emotional problems. Too little stress (boredom) may also become stressful. SUGGESTIONS TO  REDUCE STRESS:  Talk things over with your family and friends. It often is helpful to share your concerns and worries. If you feel your problem is serious, you may want to get help from a professional counselor.  Consider your problems one at a time instead of lumping them all together. Trying to take care of everything at once may seem impossible. List all the things you need to do and then start with the most important one. Set a goal to accomplish 2 or 3 things each day. If you expect to do too many in a single day you will naturally fail, causing you to feel even more stressed.  Do not use alcohol or drugs to relieve stress. Although you may feel better for a short time, they do not remove the problems that caused the stress. They can also be habit forming.  Exercise regularly - at least 3 times per week. Physical exercise can help to relieve that "uptight" feeling and will relax you.  The shortest distance between despair and hope is often a good night's sleep.  Go to bed and get up on time allowing yourself time for appointments without being rushed.  Take a short "time-out" period from any stressful situation that occurs during the day. Close your eyes and take some deep breaths. Starting with the muscles in your face, tense them, hold it for a few seconds, then relax. Repeat this with the muscles in your neck, shoulders, hand, stomach, back and legs.  Take good care of yourself. Eat a balanced diet and get plenty of rest.  Schedule time for having fun. Take a break from your daily routine to relax. HOME CARE INSTRUCTIONS   Call if you feel overwhelmed by your problems and feel you can no longer manage them on your own.  Return immediately if you feel like hurting yourself or someone else. Document Released: 07/24/2000 Document Revised: 04/22/2011 Document Reviewed: 03/16/2007 ExitCare Patient Information 2013 [employer]Care[employer].   

## 2012-09-28 NOTE — Progress Notes (Signed)
22 y.o. G0P0 Single Caucasian Fe here for annual exam.  Not currently sexually active since 03/2012. Rare  BTB or spotting on Depo Provera until next dose is due.  No LMP recorded. Patient has had an injection.          Sexually active: no  The current method of family planning is Depo-Provera injections.    Exercising: no  The patient does not participate in regular exercise at present. Smoker:  yes  Health Maintenance: Pap:  12/17/012  LGSIL Gardasil completed 01/19/07 TDaP:  2008 Labs: pt declined labs and urine.    reports that she has been smoking Cigarettes.  She has a 1.5 pack-year smoking history. She does not have any smokeless tobacco history on file. She reports that she drinks about 0.5 ounces of alcohol per week. She reports that she does not use illicit drugs.  Past Medical History  Diagnosis Date  . 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 Amenorrhea     on DepoProvera  . Scoliosis     idiopathic   . Abnormal Pap smear 09/2008     LGSIL  at age 18      Past Surgical History  Procedure Laterality Date  . Colposcopy  01/2010 & 02/2011     both were negative     Current Outpatient Prescriptions  Medication Sig Dispense Refill  . amphetamine-dextroamphetamine (ADDERALL) 15 MG tablet Take 10 mg by mouth daily.       . medroxyPROGESTERone (DEPO-PROVERA) 150 MG/ML injection Inject 150 mg into the muscle every 3 (three) months.       No current facility-administered medications for this visit.    Family History  Problem Relation Age of Onset  . Cancer Paternal Grandmother     breast  . Aplastic anemia Brother   . Hypertension Maternal Grandmother   . Hyperlipidemia Maternal Grandfather   . Diabetes Paternal Grandfather     ROS:  Pertinent items are noted in HPI.  Otherwise, a comprehensive ROS was negative.  Exam:   BP 110/54  Pulse 66  Resp 12  Ht 5' 4.5" (1.638 m)  Wt 96 lb (43.545 kg)  BMI 16.23 kg/m2 Height: 5' 4.5" (163.8 cm)   Ht Readings from Last 3 Encounters:  09/28/12 5' 4.5" (1.638 m)  09/14/12 5\' 5"  (1.651 m)  07/21/12 5' 5.25" (1.657 m)    General appearance: alert, cooperative and appears stated age Head: Normocephalic, without obvious abnormality, atraumatic Neck: no adenopathy, supple, symmetrical, trachea midline and thyroid normal to inspection and palpation Lungs: clear to auscultation bilaterally Breasts: normal appearance, no masses or tenderness Heart: regular rate and rhythm Abdomen: soft, non-tender; no masses,  no organomegaly Extremities: extremities normal, atraumatic, no cyanosis or edema Skin: Skin color, texture, turgor normal. No rashes or lesions Lymph nodes: Cervical, supraclavicular, and axillary nodes normal. No abnormal inguinal nodes palpated Neurologic: Grossly normal   Pelvic: External genitalia:  no lesions              Urethra:  normal appearing urethra with no masses, tenderness or lesions              Bartholin's and Skene's: normal                 Vagina: normal appearing vagina with normal color and discharge, no lesions              Cervix: anteverted  Pap taken: yes Bimanual Exam:  Uterus:  normal size, contour, position, consistency, mobility, non-tender              Adnexa: no mass, fullness, tenderness               Rectovaginal: Confirms               Anus:  normal sphincter tone, no lesions  A:  Well Woman with normal exam  Depo Provera for birth control  History of LGSIL with negative colpo biopsy 12/11 &  1/13  Declines STD's  P:   Pap smear as per guidelines Done today and will follow  Depo Provera given today  Counseled on breast self exam, STD prevention, adequate intake of calcium and vitamin D, diet and exercise return annually or prn  An After Visit Summary was printed and given to the patient.

## 2012-09-30 LAB — IPS PAP TEST WITH REFLEX TO HPV

## 2012-10-02 NOTE — Progress Notes (Signed)
Encounter reviewed by Dr. Kiasha Bellin Silva.  

## 2012-12-14 ENCOUNTER — Ambulatory Visit (INDEPENDENT_AMBULATORY_CARE_PROVIDER_SITE_OTHER): Payer: BC Managed Care – PPO | Admitting: *Deleted

## 2012-12-14 VITALS — BP 112/70 | HR 92 | Resp 18 | Wt 96.6 lb

## 2012-12-14 DIAGNOSIS — Z3040 Encounter for surveillance of contraceptives, unspecified: Secondary | ICD-10-CM

## 2012-12-14 MED ORDER — MEDROXYPROGESTERONE ACETATE 150 MG/ML IM SUSP
150.0000 mg | Freq: Once | INTRAMUSCULAR | Status: AC
  Administered 2012-12-14: 150 mg via INTRAMUSCULAR

## 2012-12-22 ENCOUNTER — Ambulatory Visit (INDEPENDENT_AMBULATORY_CARE_PROVIDER_SITE_OTHER): Payer: BC Managed Care – PPO | Admitting: Family Medicine

## 2012-12-22 VITALS — BP 104/64 | HR 116 | Temp 98.2°F | Resp 17 | Ht 65.5 in | Wt 94.0 lb

## 2012-12-22 DIAGNOSIS — F988 Other specified behavioral and emotional disorders with onset usually occurring in childhood and adolescence: Secondary | ICD-10-CM

## 2012-12-22 MED ORDER — DEXTROAMPHETAMINE SULFATE ER 10 MG PO CP24: ORAL_CAPSULE | ORAL | Status: DC

## 2012-12-22 NOTE — Progress Notes (Signed)
Subjective:    Patient ID: Suzanne [name], female    DOB: 25-Dec-1990, 22 y.o.   MRN: [id number] This chart was scribed for Meredith Staggers, MD by Valera Castle, ED Scribe. This patient was seen in room 9 and the patient's care was started at 6:40 PM.  HPI Suzanne [name] is a 22 y.o. female who presents to the [facility] for a f/u ADD.  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  Episodic palpitations noted.  Discussed to increase fluids as well as watching diet. 6 lb weight loss to weight of 101 lb was also noted.  Continued Dextroamphetamine 20 mg BID.   She states she hasn't been here in a long time due to the [facility] being busy.   She states the last time she took the Adderall was a couple weeks ago. She reports she was taking it alternating between 2 in the morning + 1 in the afternoon and BID.   She reports she is still with [employer] and states work is really busy. She states that work has been demanding on the Adderall use. She reports being forgetful, and having decreased motivation since being off the medicine, however she reports feeling happy and is satisfied with her job.   She states she eats 3 meals a day and drinks lots of fluids. She denies trying to lose weight, but reports she does not like her current weight, wants to weigh more. . She states she eats fiber bars in the morning, and sometimes eggs and bacon if she doesn't eat a bowl of cereal.    She reports drinking occasional EtOH, about 3 beers at a time. She reports smoking, but denies chewing tobacco. She reports having dogs at home, but denies having cats at home. No work or legal problems recently d/t alcohol.   PCP -No PCP Per Patient  There are no active problems to display for this patient.  Past Medical History  Diagnosis Date  . 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 Amenorrhea     on DepoProvera  . Scoliosis     idiopathic   . Abnormal Pap smear 09/2008     LGSIL   at age 42     Past Surgical History  Procedure Laterality Date  . Colposcopy  01/2010 & 02/2011     both were negative    No Known Allergies Prior to Admission medications   Medication Sig Start Date End Date Taking? Authorizing Provider  amphetamine-dextroamphetamine (ADDERALL) 15 MG tablet Take 10 mg by mouth daily.    Yes Historical Provider, MD  medroxyPROGESTERone (DEPO-PROVERA) 150 MG/ML injection Inject 1 mL (150 mg total) into the muscle every 3 (three) months. 09/28/12  Yes Lauro Franklin, FNP    Review of Systems  Constitutional: Negative for activity change, appetite change and unexpected weight change.  Cardiovascular: Negative for chest pain and palpitations.  Neurological: Negative for dizziness, syncope and headaches.  Psychiatric/Behavioral: Positive for decreased concentration. Negative for dysphoric mood. The patient is not nervous/anxious.       Objective:   Physical Exam  Nursing note and vitals reviewed. Constitutional: She is oriented to person, place, and time. She appears well-developed and well-nourished. No distress.  HENT:  Head: Normocephalic and atraumatic.  Eyes: Conjunctivae and EOM are normal. Pupils are equal, round, and reactive to light.  Neck: Carotid bruit is not present.  Cardiovascular: Normal rate, regular rhythm, normal heart sounds and intact distal pulses.  Exam reveals no gallop  and no friction rub.   No murmur heard. Pulmonary/Chest: Effort normal and breath sounds normal. No respiratory distress. She has no wheezes. She has no rales.  Abdominal: Soft. She exhibits no pulsatile midline mass. There is no tenderness.  Neurological: She is alert and oriented to person, place, and time.  Skin: Skin is warm and dry.  Psychiatric: She has a normal mood and affect. Her behavior is normal.   Triage Vitals: BP 104/64  Pulse 116  Temp(Src) 98.2 F (36.8 C) (Oral)  Resp 17  Ht 5' 5.5" (1.664 m)  Wt 94 lb (42.638 kg)  BMI 15.40 kg/m2   SpO2 100%  No orders of the defined types were placed in this encounter.       Assessment & Plan:   Suzanne [name] is a 22 y.o. female ADD (attention deficit disorder) - Plan: dextroamphetamine (DEXEDRINE) 10 MG 24 hr capsule, DISCONTINUED: dextroamphetamine (DEXEDRINE) 10 MG 24 hr capsule, DISCONTINUED: dextroamphetamine (DEXEDRINE) 10 MG 24 hr capsule  Discussed concerns of weight as lower than last ov.  Suspect this is in part d/t increased physical work with new job, so discussed increased caloric needs. Denies eating disorder or intentional weight loss. For now will cut back to 20mg  of dextroamphetamine in am, 10mg  in afternoon as tolerated this dose prior, but also discussed long acting nature, may be able o look at am only dosing in future.  Goal of 6 pound weight gain by next ov in 3 months. rtc precautions.    Meds ordered this encounter  Medications  . DISCONTD: dextroamphetamine (DEXEDRINE) 10 MG 24 hr capsule    Sig: 2 capsules  By mouth each morning, then take 1 in afternoon if needed.    Dispense:  90 capsule    Refill:  0  . DISCONTD: dextroamphetamine (DEXEDRINE) 10 MG 24 hr capsule    Sig: 2 capsules  By mouth each morning, then take 1 in afternoon if needed.    Dispense:  90 capsule    Refill:  0    Do not fill before 01/21/13  . dextroamphetamine (DEXEDRINE) 10 MG 24 hr capsule    Sig: 2 capsules  By mouth each morning, then take 1 in afternoon if needed.    Dispense:  90 capsule    Refill:  0    Do not fill before 02/21/13   Patient Instructions  Increase calories, recheck in next 3 months. Return to the clinic or go to the nearest emergency room if any of your symptoms worsen or new symptoms occur.   I personally performed the services described in this documentation, which was scribed in my presence. The recorded information has been reviewed and considered, and addended by me as needed.

## 2012-12-22 NOTE — Patient Instructions (Signed)
Increase calories, recheck in next 3 months. Return to the clinic or go to the nearest emergency room if any of your symptoms worsen or new symptoms occur.

## 2013-03-01 ENCOUNTER — Ambulatory Visit (INDEPENDENT_AMBULATORY_CARE_PROVIDER_SITE_OTHER): Payer: BC Managed Care – PPO | Admitting: *Deleted

## 2013-03-01 VITALS — BP 114/83 | HR 112 | Resp 18 | Ht 64.5 in | Wt 99.8 lb

## 2013-03-01 DIAGNOSIS — Z3040 Encounter for surveillance of contraceptives, unspecified: Secondary | ICD-10-CM

## 2013-03-01 MED ORDER — MEDROXYPROGESTERONE ACETATE 150 MG/ML IM SUSP
150.0000 mg | Freq: Once | INTRAMUSCULAR | Status: AC
  Administered 2013-03-01: 150 mg via INTRAMUSCULAR

## 2013-05-17 ENCOUNTER — Ambulatory Visit: Payer: BC Managed Care – PPO

## 2013-05-18 ENCOUNTER — Ambulatory Visit (INDEPENDENT_AMBULATORY_CARE_PROVIDER_SITE_OTHER): Payer: BC Managed Care – PPO | Admitting: *Deleted

## 2013-05-18 VITALS — BP 108/80 | HR 98 | Resp 18 | Wt 100.0 lb

## 2013-05-18 DIAGNOSIS — Z3040 Encounter for surveillance of contraceptives, unspecified: Secondary | ICD-10-CM

## 2013-05-18 MED ORDER — MEDROXYPROGESTERONE ACETATE 150 MG/ML IM SUSP
150.0000 mg | Freq: Once | INTRAMUSCULAR | Status: AC
  Administered 2013-05-18: 150 mg via INTRAMUSCULAR

## 2013-05-18 NOTE — Progress Notes (Signed)
Patient in for Depo Provera. Patient tolerated shot well. Advise pt next Depo shot will be due 06/23- 07/07. - Patient agreed.

## 2013-06-09 ENCOUNTER — Ambulatory Visit (INDEPENDENT_AMBULATORY_CARE_PROVIDER_SITE_OTHER): Payer: BC Managed Care – PPO | Admitting: Family Medicine

## 2013-06-09 VITALS — BP 124/82 | HR 108 | Temp 97.8°F | Resp 20 | Ht 65.0 in | Wt 99.0 lb

## 2013-06-09 DIAGNOSIS — R000 Tachycardia, unspecified: Secondary | ICD-10-CM

## 2013-06-09 DIAGNOSIS — F988 Other specified behavioral and emotional disorders with onset usually occurring in childhood and adolescence: Secondary | ICD-10-CM

## 2013-06-09 MED ORDER — DEXTROAMPHETAMINE SULFATE ER 10 MG PO CP24: ORAL_CAPSULE | ORAL | Status: DC

## 2013-06-09 NOTE — Patient Instructions (Addendum)
Although your weight has increased some since last time, I would still recommend a thyroid test as your heart rate has also been elevated last few visits, and would like to make sure that you do not have hypothyroidism. If you change your mind to have this test, let me know and I can put in an order for labs without an office visit with a provider.  We can continue the same dose of dexedrine at this point - 2 in the morning and 1-2 in the afternoon if needed, but if weight not increaseing in next few months, may need to look at non stimulant medicine such as Strattera. As discussed, I will schedule you an appointment to be seen by Dr. Laney Pastor to discuss options further. Try to decrease caffeine intake, especially before next visit to make sure this is not contributing to your elevated heart rate.   Return to the clinic or go to the nearest emergency room if any of your symptoms worsen or new symptoms occur.

## 2013-06-09 NOTE — Progress Notes (Signed)
Subjective:   This chart was scribed for Suzanne Ray, MD by Suzanne [name], [facility] Scribe. This patient was seen in room 13 and the patient's care was started 6:40 PM.    Patient ID: Suzanne [name], female    DOB: 29-May-1990, 23 y.o.   MRN: [id number]  HPI  HPI Comments: Suzanne [name] is a 23 y.o. female who presents to [facility] for ADD follow up today. Last seen 12/2012. Concerns of weight loss during that visit. At that time she had lost 6 pounds since June office visit; and another 7 pounds at last office visit with a weight of 94 pounds. At last visit eating 3 meals a day and drinking plenty of fluids. Expressed interest in gaining weight at that time. ADD controlled with Dexedrine 20 mg in the morning and 10 mg in evening if needed.  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  Weight is up from 94 pounds to 99 pounds today.  States she has been taking her brothers 20 mg Adderall (twice daily) as she has been out of her prescribed Dexedrine for 2 months. States she has been unable to follow up here. States at times she has felt she has needed to take 4 doses daily (40mg ) secondary to feeling lazy. She states she still goes out with friends occassionally and enjoys activities for leisure.   Admits to drinking alcohol twice a week; 3 drinks at a time. No depression or SI. No recent DUI's. Denies any sleep disturbances. At this time she is eating 2 and a half meals a day with snacks throughout the week. States she drinks caffeine based Monster energy drink or Red Bull throughout the week, but not every single day. She states her weight has fluctuated but states typically her weight has been staying up. Pt currently on Depo and followed by [facility]. No menstrual periods at this time.  No personal or family history of eating disorders.  No personal or family history of thyroid issues.  No other concerns this  visit.   There are no active problems to display for this patient.  Past Medical History  Diagnosis Date  . 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 Amenorrhea     on DepoProvera  . Scoliosis     idiopathic   . Abnormal Pap smear 09/2008     LGSIL  at age 73     Past Surgical History  Procedure Laterality Date  . Colposcopy  01/2010 & 02/2011     both were negative    No Known Allergies Prior to Admission medications   Medication Sig Start Date End Date Taking? Authorizing Provider  dextroamphetamine (DEXEDRINE) 10 MG 24 hr capsule 2 capsules  By mouth each morning, then take 1 in afternoon if needed. 12/22/12  Yes Suzanne Agreste, MD  medroxyPROGESTERone (DEPO-PROVERA) 150 MG/ML injection Inject 1 mL (150 mg total) into the muscle every 3 (three) months. 09/28/12  Yes Suzanne Cage, FNP   History   Social History  . Marital Status: Single    Spouse Name: N/A    Number of Children: N/A  . Years of Education: N/A   Occupational History  . Not on file.   Social History Main Topics  . Smoking status: Current Every Day Smoker -- 0.50 packs/day for 3 years    Types: Cigarettes  . Smokeless tobacco: Not on file  . Alcohol Use: 0.5  oz/week    1 drink(s) per week  . Drug Use: No  . Sexual Activity: No   Other Topics Concern  . Not on file   Social History Narrative  . No narrative on file     Review of Systems  Constitutional: Negative for fever, chills, appetite change and unexpected weight change.  HENT: Negative for congestion.   Eyes: Negative for redness.  Respiratory: Negative for cough.   Skin: Negative for rash.  Psychiatric/Behavioral: The patient is nervous/anxious.     Filed Vitals:   06/09/13 1723  BP: 124/82  Pulse: 108  Temp: 97.8 F (36.6 C)  Resp: 20  Height: 5\' 5"  (1.651 m)  Weight: 99 lb (44.906 kg)  SpO2: 100%    Objective:  Physical Exam  Nursing note and vitals reviewed. Constitutional: She is oriented  to person, place, and time. She appears well-developed and well-nourished.  HENT:  Head: Normocephalic and atraumatic.  Eyes: EOM are normal.  Neck: Normal range of motion.  Cardiovascular: Normal rate, regular rhythm and normal heart sounds.  Exam reveals no gallop and no friction rub.   No murmur heard. Pulmonary/Chest: Effort normal.  Musculoskeletal: Normal range of motion.  Lymphadenopathy:    She has no cervical adenopathy.  Neurological: She is alert and oriented to person, place, and time.  Skin: Skin is warm and dry.  Psychiatric: She has a normal mood and affect. Her behavior is normal.   Assessment & Plan:   Suzanne [name] is a 23 y.o. female ADD (attention deficit disorder) - Plan: dextroamphetamine (DEXEDRINE) 10 MG 24 hr capsule, DISCONTINUED: dextroamphetamine (DEXEDRINE) 10 MG 24 hr capsule, DISCONTINUED: dextroamphetamine (DEXEDRINE) 10 MG 24 hr capsule  -controlled ADD sx's on 30-40mg  dexedrine each day, but again discussed concerns of underweight. She has had 5 pound weight gain since last time.  Discussed if not continuing to gain weight over next 3 months, may need to look at nonstimulant option such as Strattera. She would like to follow up with Dr. Laney Pastor to discuss ADD meds next ov.  Requested 6 months of meds initially and appt, which I sent message to schedulers, but 3 months meds given as weight concerns above.   Tachycardia -  - as above, discussed concern of weight loss prior and still underweight.  This and tachycardia - recommended TSH today, but declined due to fear of needles. Advised that hyperthyroidism can present with these symptoms. If she changes her mind about the test - can place lab only order. Advised to decrease caffeine - and avoid prior to next ov.    Meds ordered this encounter  Medications  . DISCONTD: dextroamphetamine (DEXEDRINE) 10 MG 24 hr capsule    Sig: 2 capsules by mouth each morning, then 1-2 capsules by mouth in afternoon if needed.     Dispense:  120 capsule    Refill:  0  . DISCONTD: dextroamphetamine (DEXEDRINE) 10 MG 24 hr capsule    Sig: 2 capsules by mouth each morning, then 1-2 capsules by mouth in afternoon if needed.    Dispense:  120 capsule    Refill:  0    May fill 30 days after date on prescription  . dextroamphetamine (DEXEDRINE) 10 MG 24 hr capsule    Sig: 2 capsules by mouth each morning, then 1-2 capsules by mouth in afternoon if needed.    Dispense:  120 capsule    Refill:  0    May fill 60 days after date on prescription  Patient Instructions  Although your weight has increased some since last time, I would still recommend a thyroid test as your heart rate has also been elevated last few visits, and would like to make sure that you do not have hypothyroidism. If you change your mind to have this test, let me know and I can put in an order for labs without an office visit with a provider.  We can continue the same dose of dexedrine at this point - 2 in the morning and 1-2 in the afternoon if needed, but if weight not increaseing in next few months, may need to look at non stimulant medicine such as Strattera. As discussed, I will schedule you an appointment to be seen by Dr. Laney Pastor to discuss options further. Try to decrease caffeine intake, especially before next visit to make sure this is not contributing to your elevated heart rate.   Return to the clinic or go to the nearest emergency room if any of your symptoms worsen or new symptoms occur.        I personally performed the services described in this documentation, which was scribed in my presence. The recorded information has been reviewed and considered, and addended by me as needed.

## 2013-06-16 ENCOUNTER — Telehealth: Payer: Self-pay | Admitting: Radiology

## 2013-06-16 NOTE — Telephone Encounter (Signed)
Dr Carlota Raspberry wants you to see patient, will you accept her?

## 2013-06-16 NOTE — Telephone Encounter (Signed)
Message copied by Candice Camp on Wed Jun 16, 2013 10:35 AM ------      Message from: Carlota Raspberry, JEFFREY R      Created: Wed Jun 09, 2013  7:30 PM       3 month appointment with Dr. Laney Pastor for follow up ADD, weight check and follow up tachycardia.  ------

## 2013-06-21 NOTE — Telephone Encounter (Signed)
Thanks she has an upcoming appointment

## 2013-06-21 NOTE — Telephone Encounter (Signed)
Sure--I see brother already

## 2013-08-03 ENCOUNTER — Ambulatory Visit (INDEPENDENT_AMBULATORY_CARE_PROVIDER_SITE_OTHER): Payer: BC Managed Care – PPO | Admitting: Nurse Practitioner

## 2013-08-03 VITALS — BP 112/70 | HR 68 | Ht 65.0 in | Wt 97.0 lb

## 2013-08-03 DIAGNOSIS — Z3040 Encounter for surveillance of contraceptives, unspecified: Secondary | ICD-10-CM

## 2013-08-03 MED ORDER — MEDROXYPROGESTERONE ACETATE 150 MG/ML IM SUSP
150.0000 mg | Freq: Once | INTRAMUSCULAR | Status: AC
  Administered 2013-08-03: 150 mg via INTRAMUSCULAR

## 2013-08-03 MED ORDER — MEDROXYPROGESTERONE ACETATE 150 MG/ML IM SUSP: 150.0000 mg | Freq: Once | INTRAMUSCULAR | Status: DC

## 2013-08-03 NOTE — Progress Notes (Signed)
Patient is within Depo Provera Calender Limits  6/23-08/17/13 Next Depo Due between: 9/8-9/22 Last AEX: 09/28/12 AEX scheduled: no current AEX scheduled   Patient is aware that she is due for AEX in 09/2013 and that will need to be done before next Depo Injection is given.  Pt tolerated Injection well.

## 2013-08-18 ENCOUNTER — Encounter: Payer: Self-pay | Admitting: Internal Medicine

## 2013-08-18 ENCOUNTER — Ambulatory Visit (INDEPENDENT_AMBULATORY_CARE_PROVIDER_SITE_OTHER): Payer: BC Managed Care – PPO | Admitting: Internal Medicine

## 2013-08-18 VITALS — BP 128/86 | HR 95 | Temp 97.6°F | Resp 16 | Ht 65.25 in | Wt 97.0 lb

## 2013-08-18 DIAGNOSIS — F988 Other specified behavioral and emotional disorders with onset usually occurring in childhood and adolescence: Secondary | ICD-10-CM

## 2013-08-18 MED ORDER — AMPHETAMINE-DEXTROAMPHETAMINE 20 MG PO TABS: 20.0000 mg | ORAL_TABLET | Freq: Two times a day (BID) | ORAL | Status: DC

## 2013-08-18 NOTE — Progress Notes (Signed)
Subjective:  This chart was scribed for Dr. Linton Ham. Laney Pastor, MD  by Stacy Gardner, [facility] Scribe. The patient was seen in room and the patient's care was started at 2:41 PM.   Patient ID: Suzanne [name], female    DOB: [birth year], 23 y.o.   MRN: [id number]  08/18/2013  medication review   HPI HPI Comments: Suzanne [name] is a 23 y.o. female with hx of ADD who arrives to the [facility] for a review of her medications. Pt requests a refill of her Dexedrine prescription. She mentions that she is finding the drug to be less effective over time. She is also more stressed over time. She has never had side effects to medication. She started on this medicine because her brother was on at and she tried it and was successful early in high school.  Pt smokes and mentions that she smokes tobacco to manage stress. Not ready to quit. 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 Her father who has had substance abuse problems has moved to [place]. She is estranged from her mother the parents are divorced. Her older brother is in [place] and is recently been in legal trouble for stealing vehicles from the business. He has had a history of drug problems the day to early high school. He had to drop out of college due to this.  Pt is going to [employer] in the fall to study business. Pt is raising her little brother 12th grader also estranged from mom, who is addicted to video games. She does not have a boyfriend.    Review of Systems  Constitutional: Negative for appetite change, fatigue and unexpected weight change.       She vacillates w/low wt. All of her family is wiry/thin. She has never had an eating disorder and has no current symptoms of food control or bulimia.  Respiratory: Negative for cough and shortness of breath.   Cardiovascular: Negative for chest pain and palpitations.  Gastrointestinal: Negative for abdominal pain.   Neurological: Negative for speech difficulty and headaches.  Psychiatric/Behavioral: Negative for suicidal ideas, hallucinations, behavioral problems, sleep disturbance, self-injury, dysphoric mood and agitation.    Past Medical History  Diagnosis Date  . 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 Amenorrhea     on DepoProvera  . Scoliosis     idiopathic   . Abnormal Pap smear 09/2008     LGSIL  at age 87     No Known Allergies     Objective:    BP 128/86  Pulse 95  Temp(Src) 97.6 F (36.4 C) (Oral)  Resp 16  Ht 5' 5.25" (1.657 m)  Wt 97 lb (43.999 kg)  BMI 16.02 kg/m2  SpO2 97% Physical Exam HEENT clear Heart regular without murmur Neurological intact Mood good/affect appropriate/thought content good/judgment appears sound     Assessment & Plan:    I have completed the patient encounter in its entirety as documented by the scribe, with editing by me where necessary. Robert P. Laney Pastor, M.D.  ADD (attention deficit disorder)  We will change her Dexedrine and use a trial of Adderall Meds ordered this encounter  Medications  . amphetamine-dextroamphetamine (ADDERALL) 20 MG tablet    Sig: Take 1 tablet (20 mg total) by mouth 2 (two) times daily.    Dispense:  60 tablet    Refill:  0  . amphetamine-dextroamphetamine (ADDERALL) 20 MG tablet  Sig: Take 1 tablet (20 mg total) by mouth 2 (two) times daily. For 30 d after signed    Dispense:  60 tablet    Refill:  0  . amphetamine-dextroamphetamine (ADDERALL) 20 MG tablet    Sig: Take 1 tablet (20 mg total) by mouth 2 (two) times daily. For 60 d after signed    Dispense:  60 tablet    Refill:  0   If this dose is not sufficient she will contact me through my chart If everything is working well she can followup in 6 months If she returns to school we should set up student services She has the opportunity to succeed where most of her family has failed and we should support her in all her  endeavors

## 2013-10-20 ENCOUNTER — Encounter: Payer: Self-pay | Admitting: Nurse Practitioner

## 2013-10-20 ENCOUNTER — Encounter: Payer: BC Managed Care – PPO | Admitting: Nurse Practitioner

## 2013-10-20 NOTE — Progress Notes (Signed)
This encounter was created in error - please disregard.

## 2013-10-26 ENCOUNTER — Telehealth: Payer: Self-pay | Admitting: *Deleted

## 2013-10-26 ENCOUNTER — Ambulatory Visit: Payer: BC Managed Care – PPO

## 2013-10-26 NOTE — Telephone Encounter (Signed)
Patient scheduled for Depo injection today. Overdue for AEX. No showed for AEX and Depo last week. Documented at last Depo that she was aware due for both AEX and Depo. Call to patient, advised need to reschedule injection today to include AEX and Depo together.  Declined to keep scheduled appointment for today and add AEX.  Appointment rescheduled to 10-28-13 with Edman Circle FNP.  Routing to provider for final review. Patient agreeable to disposition. Will close encounter

## 2013-10-28 ENCOUNTER — Encounter: Payer: Self-pay | Admitting: Nurse Practitioner

## 2013-10-28 ENCOUNTER — Ambulatory Visit (INDEPENDENT_AMBULATORY_CARE_PROVIDER_SITE_OTHER): Payer: BC Managed Care – PPO | Admitting: Nurse Practitioner

## 2013-10-28 VITALS — BP 122/90 | HR 92 | Ht 66.5 in | Wt 99.0 lb

## 2013-10-28 DIAGNOSIS — Z01419 Encounter for gynecological examination (general) (routine) without abnormal findings: Secondary | ICD-10-CM

## 2013-10-28 DIAGNOSIS — Z0000 Encounter for general adult medical examination without abnormal findings: Secondary | ICD-10-CM

## 2013-10-28 DIAGNOSIS — Z3040 Encounter for surveillance of contraceptives, unspecified: Secondary | ICD-10-CM

## 2013-10-28 LAB — POCT URINALYSIS DIPSTICK
Bilirubin, UA: NEGATIVE
GLUCOSE UA: NEGATIVE
Ketones, UA: NEGATIVE
Leukocytes, UA: NEGATIVE
Nitrite, UA: NEGATIVE
RBC UA: NEGATIVE
UROBILINOGEN UA: NEGATIVE
pH, UA: 5

## 2013-10-28 MED ORDER — MEDROXYPROGESTERONE ACETATE 150 MG/ML IM SUSP
150.0000 mg | Freq: Once | INTRAMUSCULAR | Status: AC
  Administered 2013-10-28: 150 mg via INTRAMUSCULAR

## 2013-10-28 NOTE — Patient Instructions (Signed)
General topics  Next pap or exam is  due in 1 year Take a Women's multivitamin Take 1200 mg. of calcium daily - prefer dietary If any concerns in interim to call back  Breast Self-Awareness Practicing breast self-awareness may pick up problems early, prevent significant medical complications, and possibly save your life. By practicing breast self-awareness, you can become familiar with how your breasts look and feel and if your breasts are changing. This allows you to notice changes early. It can also offer you some reassurance that your breast health is good. One way to learn what is normal for your breasts and whether your breasts are changing is to do a breast self-exam. If you find a lump or something that was not present in the past, it is best to contact your caregiver right away. Other findings that should be evaluated by your caregiver include nipple discharge, especially if it is bloody; skin changes or reddening; areas where the skin seems to be pulled in (retracted); or new lumps and bumps. Breast pain is seldom associated with cancer (malignancy), but should also be evaluated by a caregiver. BREAST SELF-EXAM The best time to examine your breasts is 5 7 days after your menstrual period is over.  ExitCare Patient Information 2013 ExitCare[employer].   Exercise to Stay Healthy Exercise helps you become and stay healthy. EXERCISE IDEAS AND TIPS Choose exercises that:  You enjoy.  Fit into your day. You do not need to exercise really hard to be healthy. You can do exercises at a slow or medium level and stay healthy. You can:  Stretch before and after working out.  Try yoga, Pilates, or tai chi.  Lift weights.  Walk fast, swim, jog, run, climb stairs, bicycle, dance, or rollerskate.  Take aerobic classes. Exercises that burn about 150 calories:  Running 1  miles in 15 minutes.  Playing volleyball for 45 to 60 minutes.  Washing and waxing a car for 45 to 60  minutes.  Playing touch football for 45 minutes.  Walking 1  miles in 35 minutes.  Pushing a stroller 1  miles in 30 minutes.  Playing basketball for 30 minutes.  Raking leaves for 30 minutes.  Bicycling 5 miles in 30 minutes.  Walking 2 miles in 30 minutes.  Dancing for 30 minutes.  Shoveling snow for 15 minutes.  Swimming laps for 20 minutes.  Walking up stairs for 15 minutes.  Bicycling 4 miles in 15 minutes.  Gardening for 30 to 45 minutes.  Jumping rope for 15 minutes.  Washing windows or floors for 45 to 60 minutes. Document Released: 03/02/2010 Document Revised: 04/22/2011 Document Reviewed: 03/02/2010 ExitCare Patient Information 2013 ExitCare[employer].   Other topics ( that may be useful information):    Sexually Transmitted Disease Sexually transmitted disease (STD) refers to any infection that is passed from person to person during sexual activity. This may happen by way of saliva, semen, blood, vaginal mucus, or urine. Common STDs include:  Gonorrhea.  Chlamydia.  Syphilis.  HIV/AIDS.  Genital herpes.  Hepatitis B and C.  Trichomonas.  Human papillomavirus (HPV).  Pubic lice. CAUSES  An STD may be spread by bacteria, virus, or parasite. A person can get an STD by:  Sexual intercourse with an infected person.  Sharing sex toys with an infected person.  Sharing needles with an infected person.  Having intimate contact with the genitals, mouth, or rectal areas of an infected person. SYMPTOMS  Some people may not have any symptoms, but   they can still pass the infection to others. Different STDs have different symptoms. Symptoms include:  Painful or bloody urination.  Pain in the pelvis, abdomen, vagina, anus, throat, or eyes.  Skin rash, itching, irritation, growths, or sores (lesions). These usually occur in the genital or anal area.  Abnormal vaginal discharge.  Penile discharge in men.  Soft, flesh-colored skin growths in the  genital or anal area.  Fever.  Pain or bleeding during sexual intercourse.  Swollen glands in the groin area.  Yellow skin and eyes (jaundice). This is seen with hepatitis. DIAGNOSIS  To make a diagnosis, your caregiver may:  Take a medical history.  Perform a physical exam.  Take a specimen (culture) to be examined.  Examine a sample of discharge under a microscope.  Perform blood test TREATMENT   Chlamydia, gonorrhea, trichomonas, and syphilis can be cured with antibiotic medicine.  Genital herpes, hepatitis, and HIV can be treated, but not cured, with prescribed medicines. The medicines will lessen the symptoms.  Genital warts from HPV can be treated with medicine or by freezing, burning (electrocautery), or surgery. Warts may come back.  HPV is a virus and cannot be cured with medicine or surgery.However, abnormal areas may be followed very closely by your caregiver and may be removed from the cervix, vagina, or vulva through office procedures or surgery. If your diagnosis is confirmed, your recent sexual partners need treatment. This is true even if they are symptom-free or have a negative culture or evaluation. They should not have sex until their caregiver says it is okay. HOME CARE INSTRUCTIONS  All sexual partners should be informed, tested, and treated for all STDs.  Take your antibiotics as directed. Finish them even if you start to feel better.  Only take over-the-counter or prescription medicines for pain, discomfort, or fever as directed by your caregiver.  Rest.  Eat a balanced diet and drink enough fluids to keep your urine clear or pale yellow.  Do not have sex until treatment is completed and you have followed up with your caregiver. STDs should be checked after treatment.  Keep all follow-up appointments, Pap tests, and blood tests as directed by your caregiver.  Only use latex condoms and water-soluble lubricants during sexual activity. Do not use  petroleum jelly or oils.  Avoid alcohol and illegal drugs.  Get vaccinated for HPV and hepatitis. If you have not received these vaccines in the past, talk to your caregiver about whether one or both might be right for you.  Avoid risky sex practices that can break the skin. The only way to avoid getting an STD is to avoid all sexual activity.Latex condoms and dental dams (for oral sex) will help lessen the risk of getting an STD, but will not completely eliminate the risk. SEEK MEDICAL CARE IF:   You have a fever.  You have any new or worsening symptoms. Document Released: 04/20/2002 Document Revised: 04/22/2011 Document Reviewed: 04/27/2010 [facility] Patient Information 2013 [place].    Domestic Abuse You are being battered or abused if someone close to you hits, pushes, or physically hurts you in any way. You also are being abused if you are forced into activities. You are being sexually abused if you are forced to have sexual contact of any kind. You are being emotionally abused if you are made to feel worthless or if you are constantly threatened. It is important to remember that help is available. No one has the right to abuse you. PREVENTION OF FURTHER  ABUSE  Learn the warning signs of danger. This varies with situations but may include: the use of alcohol, threats, isolation from friends and family, or forced sexual contact. Leave if you feel that violence is going to occur.  If you are attacked or beaten, report it to the police so the abuse is documented. You do not have to press charges. The police can protect you while you or the attackers are leaving. Get the officer's name and badge number and a copy of the report.  Find someone you can trust and tell them what is happening to you: your caregiver, a nurse, clergy member, close friend or family member. Feeling ashamed is natural, but remember that you have done nothing wrong. No one deserves abuse. Document Released:  01/26/2000 Document Revised: 04/22/2011 Document Reviewed: 04/05/2010 ExitCare Patient Information 2013 ExitCare[employer] LLC.    How Much is Too Much Alcohol? Drinking too much alcohol can cause injury, accidents, and health problems. These types of problems can include:   Car crashes.  Falls.  Family fighting (domestic violence).  Drowning.  Fights.  Injuries.  Burns.  Damage to certain organs.  Having a baby with birth defects. ONE DRINK CAN BE TOO MUCH WHEN YOU ARE:  Working.  Pregnant or breastfeeding.  Taking medicines. Ask your doctor.  Driving or planning to drive. If you or someone you know has a drinking problem, get help from a doctor.  Document Released: 11/24/2008 Document Revised: 04/22/2011 Document Reviewed: 11/24/2008 ExitCare Patient Information 2013 ExitCare[employer] LLC.   Smoking Hazards Smoking cigarettes is extremely bad for your health. Tobacco smoke has over 200 known poisons in it. There are over 60 chemicals in tobacco smoke that cause cancer. Some of the chemicals found in cigarette smoke include:   Cyanide.  Benzene.  Formaldehyde.  Methanol (wood alcohol).  Acetylene (fuel used in welding torches).  Ammonia. Cigarette smoke also contains the poisonous gases nitrogen oxide and carbon monoxide.  Cigarette smokers have an increased risk of many serious medical problems and Smoking causes approximately:  90% of all lung cancer deaths in men.  80% of all lung cancer deaths in women.  90% of deaths from chronic obstructive lung disease. Compared with nonsmokers, smoking increases the risk of:  Coronary heart disease by 2 to 4 times.  Stroke by 2 to 4 times.  Men developing lung cancer by 23 times.  Women developing lung cancer by 13 times.  Dying from chronic obstructive lung diseases by 12 times.  . Smoking is the most preventable cause of death and disease in our society.  WHY IS SMOKING ADDICTIVE?  Nicotine is the chemical  agent in tobacco that is capable of causing addiction or dependence.  When you smoke and inhale, nicotine is absorbed rapidly into the bloodstream through your lungs. Nicotine absorbed through the lungs is capable of creating a powerful addiction. Both inhaled and non-inhaled nicotine may be addictive.  Addiction studies of cigarettes and spit tobacco show that addiction to nicotine occurs mainly during the teen years, when young people begin using tobacco products. WHAT ARE THE BENEFITS OF QUITTING?  There are many health benefits to quitting smoking.   Likelihood of developing cancer and heart disease decreases. Health improvements are seen almost immediately.  Blood pressure, pulse rate, and breathing patterns start returning to normal soon after quitting. QUITTING SMOKING   American Lung Association - 1-800-LUNGUSA  American Cancer Society - 1-800-ACS-2345 Document Released: 03/07/2004 Document Revised: 04/22/2011 Document Reviewed: 11/09/2008 ExitCare Patient Information 2013 ExitCare,   LLC.   Stress Management Stress is a state of physical or mental tension that often results from changes in your life or normal routine. Some common causes of stress are:  Death of a loved one.  Injuries or severe illnesses.  Getting fired or changing jobs.  Moving into a new home. Other causes may be:  Sexual problems.  Business or financial losses.  Taking on a large debt.  Regular conflict with someone at home or at work.  Constant tiredness from lack of sleep. It is not just bad things that are stressful. It may be stressful to:  Win the lottery.  Get married.  Buy a new car. The amount of stress that can be easily tolerated varies from person to person. Changes generally cause stress, regardless of the types of change. Too much stress can affect your health. It may lead to physical or emotional problems. Too little stress (boredom) may also become stressful. SUGGESTIONS TO  REDUCE STRESS:  Talk things over with your family and friends. It often is helpful to share your concerns and worries. If you feel your problem is serious, you may want to get help from a professional counselor.  Consider your problems one at a time instead of lumping them all together. Trying to take care of everything at once may seem impossible. List all the things you need to do and then start with the most important one. Set a goal to accomplish 2 or 3 things each day. If you expect to do too many in a single day you will naturally fail, causing you to feel even more stressed.  Do not use alcohol or drugs to relieve stress. Although you may feel better for a short time, they do not remove the problems that caused the stress. They can also be habit forming.  Exercise regularly - at least 3 times per week. Physical exercise can help to relieve that "uptight" feeling and will relax you.  The shortest distance between despair and hope is often a good night's sleep.  Go to bed and get up on time allowing yourself time for appointments without being rushed.  Take a short "time-out" period from any stressful situation that occurs during the day. Close your eyes and take some deep breaths. Starting with the muscles in your face, tense them, hold it for a few seconds, then relax. Repeat this with the muscles in your neck, shoulders, hand, stomach, back and legs.  Take good care of yourself. Eat a balanced diet and get plenty of rest.  Schedule time for having fun. Take a break from your daily routine to relax. HOME CARE INSTRUCTIONS   Call if you feel overwhelmed by your problems and feel you can no longer manage them on your own.  Return immediately if you feel like hurting yourself or someone else. Document Released: 07/24/2000 Document Revised: 04/22/2011 Document Reviewed: 03/16/2007 ExitCare Patient Information 2013 ExitCare, LLC.  

## 2013-10-28 NOTE — Progress Notes (Signed)
Patient ID: Suzanne [name], female   DOB: 12/18/1990, 23 y.o.   MRN: [id number] 23 y.o. G0P0 Single Caucasian Fe here for annual exam.  Still on Depo Provera.  Last injection was 08/03/13. Currently no BTB or spotting at time of next injection. Last SA in March.  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  He has now moved to [place].  She normally is on the roof tops and working with other men.  Patient's last menstrual period was 04/15/2011.          Sexually active: no  The current method of family planning is Depo-Provera injections and abstinence.  Exercising: no The patient does not participate in regular exercise at present.  Pt has strenuous job, Theme park manager. Smoker: yes, 1/2 pack per day  Health Maintenance:  Pap: 09/28/12, WNL; 12/17/012 LGSIL  Gardasil completed 01/19/07  TDaP: 2008  Labs: pt declined labs   Urine:  Trace protein      reports that she has been smoking Cigarettes.  She has a 1.5 pack-year smoking history. She does not have any smokeless tobacco history on file. She reports that she drinks about .5 ounces of alcohol per week. She reports that she does not use illicit drugs.  Past Medical History  Diagnosis Date  . 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 Amenorrhea     on DepoProvera  . Scoliosis     idiopathic   . Abnormal Pap smear 09/2008     LGSIL  at age 10      Past Surgical History  Procedure Laterality Date  . Colposcopy  01/2010 & 02/2011     both were negative     Current Outpatient Prescriptions  Medication Sig Dispense Refill  . amphetamine-dextroamphetamine (ADDERALL) 20 MG tablet Take 1 tablet (20 mg total) by mouth 2 (two) times daily.  60 tablet  0  . amphetamine-dextroamphetamine (ADDERALL) 20 MG tablet Take 1 tablet (20 mg total) by mouth 2 (two) times daily. For 30 d after signed  60 tablet  0  . amphetamine-dextroamphetamine (ADDERALL) 20 MG tablet Take 1 tablet (20 mg total) by mouth 2 (two) times daily. For  60 d after signed  60 tablet  0  . medroxyPROGESTERone (DEPO-PROVERA) 150 MG/ML injection Inject 1 mL (150 mg total) into the muscle every 3 (three) months.  1 mL  4   No current facility-administered medications for this visit.    Family History  Problem Relation Age of Onset  . Cancer Paternal Grandmother     breast  . Aplastic anemia Brother   . Hypertension Maternal Grandmother   . Hyperlipidemia Maternal Grandfather   . Diabetes Paternal Grandfather   . Cancer Paternal Grandfather     ROS:  Pertinent items are noted in HPI.  Otherwise, a comprehensive ROS was negative.  Exam:   BP 122/90  Pulse 92  Ht 5' 6.5" (1.689 m)  Wt 99 lb (44.906 kg)  BMI 15.74 kg/m2  LMP 04/15/2011 Height: 5' 6.5" (168.9 cm)  Ht Readings from Last 3 Encounters:  10/28/13 5' 6.5" (1.689 m)  08/18/13 5' 5.25" (1.657 m)  08/03/13 5\' 5"  (1.651 m)    General appearance: alert, cooperative and appears stated age Head: Normocephalic, without obvious abnormality, atraumatic Neck: no adenopathy, supple, symmetrical, trachea midline and thyroid normal to inspection and palpation Lungs: clear to auscultation bilaterally Breasts: normal appearance, no masses or tenderness Heart: regular rate and rhythm Abdomen: soft, non-tender; no masses,  no  organomegaly Extremities: extremities normal, atraumatic, no cyanosis or edema Skin: Skin color, texture, turgor normal. No rashes or lesions Lymph nodes: Cervical, supraclavicular, and axillary nodes normal. No abnormal inguinal nodes palpated Neurologic: Grossly normal   Pelvic: External genitalia:  no lesions              Urethra:  normal appearing urethra with no masses, tenderness or lesions              Bartholin's and Skene's: normal                 Vagina: normal appearing vagina with normal color and discharge, no lesions              Cervix: anteverted              Pap taken: Yes.   Bimanual Exam:  Uterus:  normal size, contour, position,  consistency, mobility, non-tender              Adnexa: no mass, fullness, tenderness               Rectovaginal: Confirms               Anus:  normal sphincter tone, no lesions  A:  Well Woman with normal exam  Depo Provera for contraception  R/O STD's - GC & Chl only, she declines other labs  History of LGSIL with negative colpo biopsy 12/11 & 1/13   P:   Reviewed health and wellness pertinent to exam  Pap smear taken today  Refill Depo Provera for a year & injection will be given today.  Counseled with taking calcium dietary or supplements.  Counseled on breast self exam, use and side effects of Depo Provera, adequate intake of calcium and vitamin D, diet and exercise, Kegel's exercises return annually or prn  An After Visit Summary was printed and given to the patient.

## 2013-11-01 LAB — IPS PAP TEST WITH REFLEX TO HPV

## 2013-11-01 NOTE — Progress Notes (Signed)
Encounter reviewed by Dr. Cleatus Gabriel Silva.  

## 2013-11-02 LAB — IPS N GONORRHOEA AND CHLAMYDIA BY PCR

## 2013-11-16 ENCOUNTER — Telehealth: Payer: Self-pay

## 2013-11-16 DIAGNOSIS — F988 Other specified behavioral and emotional disorders with onset usually occurring in childhood and adolescence: Secondary | ICD-10-CM

## 2013-11-16 NOTE — Telephone Encounter (Signed)
Pt in need of her Adderall , states she would prefer to go back to the Dextro 20mg s instead. Please call [phone]

## 2013-11-17 MED ORDER — DEXTROAMPHETAMINE SULFATE ER 10 MG PO CP24: ORAL_CAPSULE | ORAL | Status: DC

## 2013-11-17 NOTE — Telephone Encounter (Signed)
Meds ordered this encounter  Medications  . dextroamphetamine (DEXEDRINE) 10 MG 24 hr capsule    Sig: 2 capsules by mouth each morning, then 1-2 capsules by mouth in afternoon if needed.    Dispense:  120 capsule    Refill:  0    May fill 60 days after date on prescription  . dextroamphetamine (DEXEDRINE) 10 MG 24 hr capsule    Sig: 2 capsules by mouth each morning, then 1-2 capsules by mouth in afternoon if needed.    Dispense:  120 capsule    Refill:  0    May fill 30 days after date on prescription  . dextroamphetamine (DEXEDRINE) 10 MG 24 hr capsule    Sig: 2 capsules by mouth each morning, then 1-2 capsules by mouth in afternoon if needed.    Dispense:  120 capsule    Refill:  0    

## 2013-11-18 ENCOUNTER — Ambulatory Visit: Payer: BC Managed Care – PPO | Admitting: Nurse Practitioner

## 2013-11-18 NOTE — Telephone Encounter (Signed)
Please advise pt script in pick up drawer to pick up.

## 2013-11-18 NOTE — Telephone Encounter (Signed)
Notified pt. 

## 2014-01-11 ENCOUNTER — Telehealth: Payer: Self-pay

## 2014-01-11 DIAGNOSIS — F988 Other specified behavioral and emotional disorders with onset usually occurring in childhood and adolescence: Secondary | ICD-10-CM

## 2014-01-11 MED ORDER — DEXTROAMPHETAMINE SULFATE ER 10 MG PO CP24: ORAL_CAPSULE | ORAL | Status: DC

## 2014-01-11 NOTE — Telephone Encounter (Signed)
Pt requesting 90 day supply of D-Amphetamine  Best phone [phone]

## 2014-01-11 NOTE — Telephone Encounter (Signed)
Pt advised.  Rx in pick up drawer. 

## 2014-01-13 ENCOUNTER — Ambulatory Visit (INDEPENDENT_AMBULATORY_CARE_PROVIDER_SITE_OTHER): Payer: BC Managed Care – PPO

## 2014-01-13 VITALS — BP 106/70 | HR 76 | Ht 66.5 in | Wt 104.0 lb

## 2014-01-13 DIAGNOSIS — Z3040 Encounter for surveillance of contraceptives, unspecified: Secondary | ICD-10-CM

## 2014-01-13 MED ORDER — MEDROXYPROGESTERONE ACETATE 150 MG/ML IM SUSP
150.0000 mg | Freq: Once | INTRAMUSCULAR | Status: AC
  Administered 2014-01-13: 150 mg via INTRAMUSCULAR

## 2014-01-13 NOTE — Progress Notes (Signed)
Pt here for Depo Provera 150mg  Injection. Last AEX 10/28/13. Last Depo given was 10/28/13. Next Depo due between Feb 18-Apr 15, 2014. Pt tolerated injection well

## 2014-03-31 ENCOUNTER — Ambulatory Visit (INDEPENDENT_AMBULATORY_CARE_PROVIDER_SITE_OTHER): Payer: BLUE CROSS/BLUE SHIELD | Admitting: *Deleted

## 2014-03-31 ENCOUNTER — Encounter: Payer: Self-pay | Admitting: *Deleted

## 2014-03-31 VITALS — BP 120/82 | HR 116 | Ht 66.5 in | Wt 104.0 lb

## 2014-03-31 DIAGNOSIS — Z3040 Encounter for surveillance of contraceptives, unspecified: Secondary | ICD-10-CM

## 2014-03-31 MED ORDER — MEDROXYPROGESTERONE ACETATE 150 MG/ML IM SUSP
150.0000 mg | Freq: Once | INTRAMUSCULAR | Status: AC
  Administered 2014-03-31: 150 mg via INTRAMUSCULAR

## 2014-03-31 NOTE — Progress Notes (Signed)
Patient ID: Suzanne [name], female   DOB: [birth year], 24 y.o.   MRN: [id number] Pt arrived for Depo Provera injection.  Pt tolerated injection well in right gluteal. Last AEX - 10/28/13 Last Depo Provera Given - 01/13/14 Pt is within due dates. Pt should return between 06/17/14 and 07/01/14. Pt unable to return close to 5/6, advised to make sure she has appt prior to 07/01/14 to avoid delays in getting next injection.  Pt voices understanding and is agreeable.

## 2014-04-21 ENCOUNTER — Telehealth: Payer: Self-pay

## 2014-04-21 NOTE — Telephone Encounter (Signed)
Pt requesting refill on dextroamphetamine (DEXEDRINE) 10 MG 24 hr capsule [[id number]]

## 2014-04-22 NOTE — Telephone Encounter (Signed)
Yes she will need to follow up. It has been over six months she has seen Dr Laney Pastor.

## 2014-04-22 NOTE — Telephone Encounter (Signed)
Does she need a follow up?

## 2014-04-22 NOTE — Telephone Encounter (Signed)
Spoke with pt, advised she needs to walk in to see Dr. Laney Pastor. Pt understood.

## 2014-05-18 ENCOUNTER — Ambulatory Visit (INDEPENDENT_AMBULATORY_CARE_PROVIDER_SITE_OTHER): Payer: BLUE CROSS/BLUE SHIELD | Admitting: Internal Medicine

## 2014-05-18 ENCOUNTER — Encounter: Payer: Self-pay | Admitting: Internal Medicine

## 2014-05-18 VITALS — BP 134/89 | HR 113 | Temp 98.0°F | Resp 16 | Ht 65.5 in | Wt 104.2 lb

## 2014-05-18 DIAGNOSIS — F909 Attention-deficit hyperactivity disorder, unspecified type: Secondary | ICD-10-CM

## 2014-05-18 DIAGNOSIS — I471 Supraventricular tachycardia: Secondary | ICD-10-CM

## 2014-05-18 DIAGNOSIS — F17200 Nicotine dependence, unspecified, uncomplicated: Secondary | ICD-10-CM | POA: Insufficient documentation

## 2014-05-18 DIAGNOSIS — I456 Pre-excitation syndrome: Secondary | ICD-10-CM | POA: Insufficient documentation

## 2014-05-18 DIAGNOSIS — Z720 Tobacco use: Secondary | ICD-10-CM

## 2014-05-18 DIAGNOSIS — F988 Other specified behavioral and emotional disorders with onset usually occurring in childhood and adolescence: Secondary | ICD-10-CM

## 2014-05-18 MED ORDER — DEXTROAMPHETAMINE SULFATE ER 10 MG PO CP24: ORAL_CAPSULE | ORAL | Status: DC

## 2014-05-18 NOTE — Progress Notes (Signed)
   Subjective:    Patient ID: Suzanne [name], female    DOB: Dec 02, 1990, 24 y.o.   MRN: [id number]  HPI here for follow-up for attention deficit disorder In addition she has been referred back to [place] by her dentist because of a concern for rapid heartbeat noted in the office///a strip from a monitor his sent showing tachycardia approaching 140 bpm She has a history of rapid heartbeat evaluated by cardiology. She was told this was not dangerous and taught how to do Valsalva maneuvers. She has not had additional follow-up. Her younger brother had a procedure done in childhood because of tachyarrhythmias. She is unsure if this was an ablation. He is healthy now in adolescence. She rarely has cardiac symptoms that inhibited her activity level. She occasionally notes rapid heartbeat and is able to stop it with deep breathing. She is never had syncope or chest pain or shortness of breath or peripheral edema. Her activity level is not an issue.  She is a smoker, unfortunately a pack a day. Her life is significantly stressful. She is estranged from her mother. 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 He does help now but lives in [place] and mainly does paperwork. 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 She has moved back from [place] to attend business school at [employer] and is doing this in addition to work. She changed from Adderall to Dexedrine span [place] since her last visit and has less side effects and better efficacy. She admits smoking due to stress. She basically serves as her own parent and often has to take care of her adolescent brother. She has a slightly older brother who is in trouble with the law and is unable to sustain a work Financial controller. Her younger brother is addicted to video games and is failing at school  She has no hobbies/sleep is fairly good/she has no time for relationships  Review of Systems Review of systems is otherwise negative    Objective:   Physical Exam BP 134/89 mmHg  Pulse 113  Temp(Src) 98 F (36.7 C) (Oral)  Resp 16  Ht 5' 5.5" (1.664 m)  Wt 104 lb 3.2 oz (47.265 kg)  BMI 17.07 kg/m2  SpO2 97% HEENT clear Heart regular without murmur/the rate is 105/there is a variation with Valsalva Lungs clear Neurological intact Mood good affect appropriate considering her current situation  EKG demonstrates Wolff-Parkinson-White syndrome      Assessment & Plan:  SVT (supraventricular tachycardia) - Plan: EKG 12-Lead  ADD (attention deficit disorder) - Plan: dextroamphetamine (DEXEDRINE) 10 MG 24 hr capsule, dextroamphetamine (DEXEDRINE) 10 MG 24 hr capsule, dextroamphetamine (DEXEDRINE) 10 MG 24 hr capsule  Wolff-Parkinson-White syndrome--- she is cleared for her dental procedures  Because of her lack of symptoms causing trouble we will delay electrophysiologic mapping and  consideration for ablation  Smoker--- establish a gradual cessation plan   We discussed her social environment and the need to improve outside activities and relationships Call in 3 months in follow-up in 6 months

## 2014-06-29 ENCOUNTER — Ambulatory Visit (INDEPENDENT_AMBULATORY_CARE_PROVIDER_SITE_OTHER): Payer: BLUE CROSS/BLUE SHIELD | Admitting: *Deleted

## 2014-06-29 ENCOUNTER — Ambulatory Visit: Payer: BLUE CROSS/BLUE SHIELD

## 2014-06-29 ENCOUNTER — Telehealth: Payer: Self-pay | Admitting: Nurse Practitioner

## 2014-06-29 VITALS — BP 118/62 | HR 88 | Resp 14 | Wt 104.0 lb

## 2014-06-29 DIAGNOSIS — Z3040 Encounter for surveillance of contraceptives, unspecified: Secondary | ICD-10-CM

## 2014-06-29 MED ORDER — MEDROXYPROGESTERONE ACETATE 150 MG/ML IM SUSP
150.0000 mg | Freq: Once | INTRAMUSCULAR | Status: AC
  Administered 2014-06-29: 150 mg via INTRAMUSCULAR

## 2014-06-29 NOTE — Telephone Encounter (Signed)
Pt scheduled for depo this afternoon at 4pm. Pt called to reschedule but unsure of window. Advised pt per last chart note her window was 5/6-5/20. Pt said she didn't understand that based on the last shot she received on 03/31/14 and thought her window would start on 06/29/14. I advised pt we can reschedule if she needed to and have a nurse call her to discuss her window. Pt stated "i haven't had sex in like 2 years so it doesn't really matter". Pt agreeable to return call. Patients appt cancelled for 06/29/14 and rescheduled for 07/04/14.

## 2014-06-29 NOTE — Progress Notes (Signed)
Patient ID: Suzanne [name], female   DOB: 1990-06-20, 24 y.o.   MRN: [id number]  Pt arrived for Depo Provera injection. Pt tolerated injection well in right gluteal. Last AEX - 10/28/13 Last Depo Provera Given - 03-31-14 Pt is within due dates. Pt should return between 09-14-14- 09-28-14  Pt voices understanding and is agreeable

## 2014-06-29 NOTE — Telephone Encounter (Signed)
Spoke with patient. Advised patient last Depo injection was on 03/31/2014. This Depo injection is due between 5/6-5/20. "It is not a big deal if I am late. I have not had sex in two years." Advised if she does not receive injection by 5/20 will have to have serum hcg in office, abstain from intercourse for two weeks, have a repeat hcg and if this is negative will receive Depo. Patient states "Are you fucking kidding me?" Advised this is protocol for receiving the Depo injection. This is the most effective way to insure patient is safe receiving the injection. Advised we can not give Depo injection late without confirmation of negative pregnancy tests. Patient states she will just keep her original appointment scheduled for today at [facility] to schedule for 5/19 or 5/20 but patient declines stating these days are even worse for her. Rescheduled patient for today at 4pm.  Cc: Dr.Miller  Routing to provider for final review. Patient agreeable to disposition. Will close encounter.

## 2014-07-04 ENCOUNTER — Ambulatory Visit: Payer: BLUE CROSS/BLUE SHIELD

## 2014-08-19 ENCOUNTER — Telehealth: Payer: Self-pay

## 2014-08-19 DIAGNOSIS — F988 Other specified behavioral and emotional disorders with onset usually occurring in childhood and adolescence: Secondary | ICD-10-CM

## 2014-08-19 NOTE — Telephone Encounter (Signed)
Pt is needing to let dr Laney Pastor know that it is time for her med refills

## 2014-08-23 MED ORDER — DEXTROAMPHETAMINE SULFATE ER 10 MG PO CP24: ORAL_CAPSULE | ORAL | Status: DC

## 2014-08-23 NOTE — Telephone Encounter (Signed)
Meds ordered this encounter  Medications  . dextroamphetamine (DEXEDRINE) 10 MG 24 hr capsule    Sig: 2 capsules by mouth each morning, then 1-2 capsules by mouth in afternoon if needed.    Dispense:  120 capsule    Refill:  0    May fill 60 days after date on prescription  . dextroamphetamine (DEXEDRINE) 10 MG 24 hr capsule    Sig: 2 capsules by mouth each morning, then 1-2 capsules by mouth in afternoon if needed.    Dispense:  120 capsule    Refill:  0    May fill 30 days after date on prescription  . dextroamphetamine (DEXEDRINE) 10 MG 24 hr capsule    Sig: 2 capsules by mouth each morning, then 1-2 capsules by mouth in afternoon if needed.    Dispense:  120 capsule    Refill:  0

## 2014-08-24 NOTE — Telephone Encounter (Signed)
Left message Rx's ready to pick up.

## 2014-09-15 ENCOUNTER — Ambulatory Visit (INDEPENDENT_AMBULATORY_CARE_PROVIDER_SITE_OTHER): Payer: BLUE CROSS/BLUE SHIELD | Admitting: *Deleted

## 2014-09-15 VITALS — BP 100/70 | HR 96 | Resp 16 | Ht 65.5 in | Wt 102.0 lb

## 2014-09-15 DIAGNOSIS — Z3040 Encounter for surveillance of contraceptives, unspecified: Secondary | ICD-10-CM

## 2014-09-15 MED ORDER — MEDROXYPROGESTERONE ACETATE 150 MG/ML IM SUSP
150.0000 mg | Freq: Once | INTRAMUSCULAR | Status: AC
  Administered 2014-09-15: 150 mg via INTRAMUSCULAR

## 2014-09-15 NOTE — Progress Notes (Signed)
Patient is within Depo Provera Calender Limits yes, last depo given 06/29/14 Next Depo Due between: October 20 - November 3.  Last AEX: 10/28/13 PG AEX Scheduled:  Needs to schedule. Patient aware that she will need AEX before next depo.  Patient is aware when next depo is due  Pt tolerated Injection well.

## 2014-11-01 ENCOUNTER — Telehealth: Payer: Self-pay | Admitting: Internal Medicine

## 2014-11-01 NOTE — Telephone Encounter (Signed)
Spoke with pt, she just wanted to be ahead of the process. She would like the next three months to ready and she knows it is due on 10/12.

## 2014-11-01 NOTE — Telephone Encounter (Signed)
Should have enough until 10/12

## 2014-11-01 NOTE — Telephone Encounter (Signed)
Refill on Dexedrine.   [id number]

## 2014-11-07 ENCOUNTER — Telehealth: Payer: Self-pay

## 2014-11-07 DIAGNOSIS — F988 Other specified behavioral and emotional disorders with onset usually occurring in childhood and adolescence: Secondary | ICD-10-CM

## 2014-11-07 NOTE — Telephone Encounter (Deleted)
Error

## 2014-11-07 NOTE — Telephone Encounter (Signed)
Dr Laney Pastor, see other phone message from 11/01/14 re: Dexedrine RFs.

## 2014-11-07 NOTE — Telephone Encounter (Signed)
Patient is following up on refill request.

## 2014-11-08 MED ORDER — DEXTROAMPHETAMINE SULFATE ER 10 MG PO CP24: ORAL_CAPSULE | ORAL | Status: DC

## 2014-11-08 NOTE — Telephone Encounter (Signed)
Meds ordered this encounter  Medications  . dextroamphetamine (DEXEDRINE) 10 MG 24 hr capsule    Sig: 2 capsules by mouth each morning, then 1-2 capsules by mouth in afternoon if needed.    Dispense:  120 capsule    Refill:  0    May fill 01/20/15 or after  . dextroamphetamine (DEXEDRINE) 10 MG 24 hr capsule    Sig: 2 capsules by mouth each morning, then 1-2 capsules by mouth in afternoon if needed.    Dispense:  120 capsule    Refill:  0    May fill 12/21/14 or after  . dextroamphetamine (DEXEDRINE) 10 MG 24 hr capsule    Sig: 2 capsules by mouth each morning, then 1-2 capsules by mouth in afternoon if needed.For 11/21/10 or after    Dispense:  120 capsule    Refill:  0

## 2014-11-10 NOTE — Telephone Encounter (Signed)
Rx in drawer, and I notified pt.

## 2014-12-06 ENCOUNTER — Ambulatory Visit (INDEPENDENT_AMBULATORY_CARE_PROVIDER_SITE_OTHER): Payer: BLUE CROSS/BLUE SHIELD | Admitting: Certified Nurse Midwife

## 2014-12-06 ENCOUNTER — Encounter: Payer: Self-pay | Admitting: Certified Nurse Midwife

## 2014-12-06 VITALS — BP 110/68 | HR 68 | Resp 16 | Ht 65.5 in | Wt 106.0 lb

## 2014-12-06 DIAGNOSIS — Z0000 Encounter for general adult medical examination without abnormal findings: Secondary | ICD-10-CM | POA: Diagnosis not present

## 2014-12-06 DIAGNOSIS — Z01419 Encounter for gynecological examination (general) (routine) without abnormal findings: Secondary | ICD-10-CM | POA: Diagnosis not present

## 2014-12-06 DIAGNOSIS — Z3042 Encounter for surveillance of injectable contraceptive: Secondary | ICD-10-CM | POA: Diagnosis not present

## 2014-12-06 LAB — POCT URINALYSIS DIPSTICK
Bilirubin, UA: NEGATIVE
Blood, UA: NEGATIVE
GLUCOSE UA: NEGATIVE
KETONES UA: NEGATIVE
Leukocytes, UA: NEGATIVE
Nitrite, UA: NEGATIVE
PROTEIN UA: NEGATIVE
Urobilinogen, UA: NEGATIVE
pH, UA: 5

## 2014-12-06 LAB — CBC
HCT: 43.6 % (ref 36.0–46.0)
Hemoglobin: 14.9 g/dL (ref 12.0–15.0)
MCH: 32.9 pg (ref 26.0–34.0)
MCHC: 34.2 g/dL (ref 30.0–36.0)
MCV: 96.2 fL (ref 78.0–100.0)
MPV: 10.2 fL (ref 8.6–12.4)
Platelets: 300 10*3/uL (ref 150–400)
RBC: 4.53 MIL/uL (ref 3.87–5.11)
RDW: 12.5 % (ref 11.5–15.5)
WBC: 5.9 10*3/uL (ref 4.0–10.5)

## 2014-12-06 LAB — HEPATITIS C ANTIBODY: HCV Ab: NEGATIVE

## 2014-12-06 LAB — TSH: TSH: 0.73 u[IU]/mL (ref 0.350–4.500)

## 2014-12-06 MED ORDER — MEDROXYPROGESTERONE ACETATE 150 MG/ML IM SUSP: 150.0000 mg | INTRAMUSCULAR | Status: DC

## 2014-12-06 NOTE — Progress Notes (Signed)
24 y.o. G0P0 Single  Caucasian Fe here for annual exam. Periods none with Depo Provera, due in one week. Partner change, desires STD screening. Patient had back pain 3 weeks ago, with nausea and vomiting. Patient thought she had kidney issues, but then spontaneously resolved. Had UTI one week before,that resolved. Some fatigue desires blood work as needed. Eating well, working in [place] and going to school. Sees PCP, urgent care as needed. No other health concerns today.  Patient's last menstrual period was 05/12/2012.          Sexually active: Yes.    The current method of family planning is Depo-Provera injections.    Exercising: No.  exercise Smoker:  yes  Health Maintenance: Pap:  10-29-13 neg hx of LGSIL,CIN1 MMG:  none Colonoscopy:  none BMD:   none TDaP:  2008 Labs: poct urine-neg Self breast exam: not done   reports that she has been smoking Cigarettes.  She has a 1.5 pack-year smoking history. She does not have any smokeless tobacco history on file. She reports that she drinks alcohol. She reports that she does not use illicit drugs.  Past Medical History  Diagnosis Date  . 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 Amenorrhea     on DepoProvera  . Scoliosis     idiopathic   . Abnormal Pap smear 09/2008, 2013    LGSIL  at age 2      Past Surgical History  Procedure Laterality Date  . Colposcopy  01/2010 & 02/2011     both were negative   . Wisdom tooth extraction      Current Outpatient Prescriptions  Medication Sig Dispense Refill  . dextroamphetamine (DEXEDRINE) 10 MG 24 hr capsule 2 capsules by mouth each morning, then 1-2 capsules by mouth in afternoon if needed. 120 capsule 0  . medroxyPROGESTERone (DEPO-PROVERA) 150 MG/ML injection Inject 1 mL (150 mg total) into the muscle every 3 (three) months. 1 mL 4  . Triamcinolone Acetonide (KENALOG IJ) Inject 0.5 mLs as directed.     No current facility-administered medications for this visit.     Family History  Problem Relation Age of Onset  . Cancer Paternal Grandmother     breast  . Aplastic anemia Brother   . Hypertension Maternal Grandmother   . Hyperlipidemia Maternal Grandfather   . Diabetes Paternal Grandfather   . Cancer Paternal Grandfather     ROS:  Pertinent items are noted in HPI.  Otherwise, a comprehensive ROS was negative.  Exam:   BP 110/68 mmHg  Pulse 68  Resp 16  Ht 5' 5.5" (1.664 m)  Wt 106 lb (48.081 kg)  BMI 17.36 kg/m2  LMP 05/12/2012 Height: 5' 5.5" (166.4 cm) Ht Readings from Last 3 Encounters:  12/06/14 5' 5.5" (1.664 m)  09/15/14 5' 5.5" (1.664 m)  05/18/14 5' 5.5" (1.664 m)    General appearance: alert, cooperative and appears stated age Head: Normocephalic, without obvious abnormality, atraumatic Neck: no adenopathy, supple, symmetrical, trachea midline and thyroid normal to inspection and palpation Lungs: clear to auscultation bilaterally CVAT negative bilateral Breasts: normal appearance, no masses or tenderness, No nipple retraction or dimpling, No nipple discharge or bleeding, No axillary or supraclavicular adenopathy Heart: regular rate and rhythm Abdomen: soft, non-tender; no masses,  no organomegaly, negative suprapubic Extremities: extremities normal, atraumatic, no cyanosis or edema Skin: Skin color, texture, turgor normal. No rashes or lesions, warm and dry Lymph nodes: Cervical, supraclavicular, and axillary nodes normal. No abnormal inguinal nodes palpated  Neurologic: Grossly normal   Pelvic: External genitalia:  no lesions              Urethra:  normal appearing urethra with no masses, tenderness or lesions              Bartholin's and Skene's: normal                 Vagina: normal appearing vagina with normal color and discharge, no lesions              Cervix: normal,non tender,no lesions              Pap taken: Yes.   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  Labs: Gc, Chlamydia, STD panel, Affirm,  Labs: CBC, TSH  Continue follow up as indicated with MD  Pap smear as above with HPV reflex patient request   counseled on breast self exam, STD prevention, HIV risk factors and prevention, adequate intake of calcium and vitamin D, diet and exercise  return annually or prn  An After Visit Summary was printed and given to the patient.

## 2014-12-06 NOTE — Patient Instructions (Signed)
General topics  Next pap or exam is  due in 1 year Take a Women's multivitamin Take 1200 mg. of calcium daily - prefer dietary If any concerns in interim to call back  Breast Self-Awareness Practicing breast self-awareness may pick up problems early, prevent significant medical complications, and possibly save your life. By practicing breast self-awareness, you can become familiar with how your breasts look and feel and if your breasts are changing. This allows you to notice changes early. It can also offer you some reassurance that your breast health is good. One way to learn what is normal for your breasts and whether your breasts are changing is to do a breast self-exam. If you find a lump or something that was not present in the past, it is best to contact your caregiver right away. Other findings that should be evaluated by your caregiver include nipple discharge, especially if it is bloody; skin changes or reddening; areas where the skin seems to be pulled in (retracted); or new lumps and bumps. Breast pain is seldom associated with cancer (malignancy), but should also be evaluated by a caregiver. BREAST SELF-EXAM The best time to examine your breasts is 5 7 days after your menstrual period is over.  ExitCare Patient Information 2013 [employer].   Exercise to Stay Healthy Exercise helps you become and stay healthy. EXERCISE IDEAS AND TIPS Choose exercises that:  You enjoy.  Fit into your day. You do not need to exercise really hard to be healthy. You can do exercises at a slow or medium level and stay healthy. You can:  Stretch before and after working out.  Try yoga, Pilates, or tai chi.  Lift weights.  Walk fast, swim, jog, run, climb stairs, bicycle, dance, or rollerskate.  Take aerobic classes. Exercises that burn about 150 calories:  Running 1  miles in 15 minutes.  Playing volleyball for 45 to 60 minutes.  Washing and waxing a car for 45 to 60  minutes.  Playing touch football for 45 minutes.  Walking 1  miles in 35 minutes.  Pushing a stroller 1  miles in 30 minutes.  Playing basketball for 30 minutes.  Raking leaves for 30 minutes.  Bicycling 5 miles in 30 minutes.  Walking 2 miles in 30 minutes.  Dancing for 30 minutes.  Shoveling snow for 15 minutes.  Swimming laps for 20 minutes.  Walking up stairs for 15 minutes.  Bicycling 4 miles in 15 minutes.  Gardening for 30 to 45 minutes.  Jumping rope for 15 minutes.  Washing windows or floors for 45 to 60 minutes. Document Released: 03/02/2010 Document Revised: 04/22/2011 Document Reviewed: 03/02/2010 ExitCare Patient Information 2013 [employer].   Other topics ( that may be useful information):    Sexually Transmitted Disease Sexually transmitted disease (STD) refers to any infection that is passed from person to person during sexual activity. This may happen by way of saliva, semen, blood, vaginal mucus, or urine. Common STDs include:  Gonorrhea.  Chlamydia.  Syphilis.  HIV/AIDS.  Genital herpes.  Hepatitis B and C.  Trichomonas.  Human papillomavirus (HPV).  Pubic lice. CAUSES  An STD may be spread by bacteria, virus, or parasite. A person can get an STD by:  Sexual intercourse with an infected person.  Sharing sex toys with an infected person.  Sharing needles with an infected person.  Having intimate contact with the genitals, mouth, or rectal areas of an infected person. SYMPTOMS  Some people may not have any symptoms, but   they can still pass the infection to others. Different STDs have different symptoms. Symptoms include:  Painful or bloody urination.  Pain in the pelvis, abdomen, vagina, anus, throat, or eyes.  Skin rash, itching, irritation, growths, or sores (lesions). These usually occur in the genital or anal area.  Abnormal vaginal discharge.  Penile discharge in men.  Soft, flesh-colored skin growths in the  genital or anal area.  Fever.  Pain or bleeding during sexual intercourse.  Swollen glands in the groin area.  Yellow skin and eyes (jaundice). This is seen with hepatitis. DIAGNOSIS  To make a diagnosis, your caregiver may:  Take a medical history.  Perform a physical exam.  Take a specimen (culture) to be examined.  Examine a sample of discharge under a microscope.  Perform blood test TREATMENT   Chlamydia, gonorrhea, trichomonas, and syphilis can be cured with antibiotic medicine.  Genital herpes, hepatitis, and HIV can be treated, but not cured, with prescribed medicines. The medicines will lessen the symptoms.  Genital warts from HPV can be treated with medicine or by freezing, burning (electrocautery), or surgery. Warts may come back.  HPV is a virus and cannot be cured with medicine or surgery.However, abnormal areas may be followed very closely by your caregiver and may be removed from the cervix, vagina, or vulva through office procedures or surgery. If your diagnosis is confirmed, your recent sexual partners need treatment. This is true even if they are symptom-free or have a negative culture or evaluation. They should not have sex until their caregiver says it is okay. HOME CARE INSTRUCTIONS  All sexual partners should be informed, tested, and treated for all STDs.  Take your antibiotics as directed. Finish them even if you start to feel better.  Only take over-the-counter or prescription medicines for pain, discomfort, or fever as directed by your caregiver.  Rest.  Eat a balanced diet and drink enough fluids to keep your urine clear or pale yellow.  Do not have sex until treatment is completed and you have followed up with your caregiver. STDs should be checked after treatment.  Keep all follow-up appointments, Pap tests, and blood tests as directed by your caregiver.  Only use latex condoms and water-soluble lubricants during sexual activity. Do not use  petroleum jelly or oils.  Avoid alcohol and illegal drugs.  Get vaccinated for HPV and hepatitis. If you have not received these vaccines in the past, talk to your caregiver about whether one or both might be right for you.  Avoid risky sex practices that can break the skin. The only way to avoid getting an STD is to avoid all sexual activity.Latex condoms and dental dams (for oral sex) will help lessen the risk of getting an STD, but will not completely eliminate the risk. SEEK MEDICAL CARE IF:   You have a fever.  You have any new or worsening symptoms. Document Released: 04/20/2002 Document Revised: 04/22/2011 Document Reviewed: 04/27/2010 [facility] Patient Information 2013 [employer].    Domestic Abuse You are being battered or abused if someone close to you hits, pushes, or physically hurts you in any way. You also are being abused if you are forced into activities. You are being sexually abused if you are forced to have sexual contact of any kind. You are being emotionally abused if you are made to feel worthless or if you are constantly threatened. It is important to remember that help is available. No one has the right to abuse you. PREVENTION OF FURTHER  ABUSE  Learn the warning signs of danger. This varies with situations but may include: the use of alcohol, threats, isolation from friends and family, or forced sexual contact. Leave if you feel that violence is going to occur.  If you are attacked or beaten, report it to the police so the abuse is documented. You do not have to press charges. The police can protect you while you or the attackers are leaving. Get the officer's name and badge number and a copy of the report.  Find someone you can trust and tell them what is happening to you: your caregiver, a nurse, clergy member, close friend or family member. Feeling ashamed is natural, but remember that you have done nothing wrong. No one deserves abuse. Document Released:  01/26/2000 Document Revised: 04/22/2011 Document Reviewed: 04/05/2010 ExitCare Patient Information 2013 [employer].    How Much is Too Much Alcohol? Drinking too much alcohol can cause injury, accidents, and health problems. These types of problems can include:   Car crashes.  Falls.  Family fighting (domestic violence).  Drowning.  Fights.  Injuries.  Burns.  Damage to certain organs.  Having a baby with birth defects. ONE DRINK CAN BE TOO MUCH WHEN YOU ARE:  Working.  Pregnant or breastfeeding.  Taking medicines. Ask your doctor.  Driving or planning to drive. If you or someone you know has a drinking problem, get help from a doctor.  Document Released: 11/24/2008 Document Revised: 04/22/2011 Document Reviewed: 11/24/2008 ExitCare Patient Information 2013 [employer].   Smoking Hazards Smoking cigarettes is extremely bad for your health. Tobacco smoke has over 200 known poisons in it. There are over 60 chemicals in tobacco smoke that cause cancer. Some of the chemicals found in cigarette smoke include:   Cyanide.  Benzene.  Formaldehyde.  Methanol (wood alcohol).  Acetylene (fuel used in welding torches).  Ammonia. Cigarette smoke also contains the poisonous gases nitrogen oxide and carbon monoxide.  Cigarette smokers have an increased risk of many serious medical problems and Smoking causes approximately:  90% of all lung cancer deaths in men.  80% of all lung cancer deaths in women.  90% of deaths from chronic obstructive lung disease. Compared with nonsmokers, smoking increases the risk of:  Coronary heart disease by 2 to 4 times.  Stroke by 2 to 4 times.  Men developing lung cancer by 23 times.  Women developing lung cancer by 13 times.  Dying from chronic obstructive lung diseases by 12 times.  . Smoking is the most preventable cause of death and disease in our society.  WHY IS SMOKING ADDICTIVE?  Nicotine is the chemical  agent in tobacco that is capable of causing addiction or dependence.  When you smoke and inhale, nicotine is absorbed rapidly into the bloodstream through your lungs. Nicotine absorbed through the lungs is capable of creating a powerful addiction. Both inhaled and non-inhaled nicotine may be addictive.  Addiction studies of cigarettes and spit tobacco show that addiction to nicotine occurs mainly during the teen years, when young people begin using tobacco products. WHAT ARE THE BENEFITS OF QUITTING?  There are many health benefits to quitting smoking.   Likelihood of developing cancer and heart disease decreases. Health improvements are seen almost immediately.  Blood pressure, pulse rate, and breathing patterns start returning to normal soon after quitting. QUITTING SMOKING   American Lung Association - 1-800-LUNGUSA  American Cancer Society - 1-800-ACS-2345 Document Released: 03/07/2004 Document Revised: 04/22/2011 Document Reviewed: 11/09/2008 ExitCare Patient Information 2013 ExitCare,   LLC.   Stress Management Stress is a state of physical or mental tension that often results from changes in your life or normal routine. Some common causes of stress are:  Death of a loved one.  Injuries or severe illnesses.  Getting fired or changing jobs.  Moving into a new home. Other causes may be:  Sexual problems.  Business or financial losses.  Taking on a large debt.  Regular conflict with someone at home or at work.  Constant tiredness from lack of sleep. It is not just bad things that are stressful. It may be stressful to:  Win the lottery.  Get married.  Buy a new car. The amount of stress that can be easily tolerated varies from person to person. Changes generally cause stress, regardless of the types of change. Too much stress can affect your health. It may lead to physical or emotional problems. Too little stress (boredom) may also become stressful. SUGGESTIONS TO  REDUCE STRESS:  Talk things over with your family and friends. It often is helpful to share your concerns and worries. If you feel your problem is serious, you may want to get help from a professional counselor.  Consider your problems one at a time instead of lumping them all together. Trying to take care of everything at once may seem impossible. List all the things you need to do and then start with the most important one. Set a goal to accomplish 2 or 3 things each day. If you expect to do too many in a single day you will naturally fail, causing you to feel even more stressed.  Do not use alcohol or drugs to relieve stress. Although you may feel better for a short time, they do not remove the problems that caused the stress. They can also be habit forming.  Exercise regularly - at least 3 times per week. Physical exercise can help to relieve that "uptight" feeling and will relax you.  The shortest distance between despair and hope is often a good night's sleep.  Go to bed and get up on time allowing yourself time for appointments without being rushed.  Take a short "time-out" period from any stressful situation that occurs during the day. Close your eyes and take some deep breaths. Starting with the muscles in your face, tense them, hold it for a few seconds, then relax. Repeat this with the muscles in your neck, shoulders, hand, stomach, back and legs.  Take good care of yourself. Eat a balanced diet and get plenty of rest.  Schedule time for having fun. Take a break from your daily routine to relax. HOME CARE INSTRUCTIONS   Call if you feel overwhelmed by your problems and feel you can no longer manage them on your own.  Return immediately if you feel like hurting yourself or someone else. Document Released: 07/24/2000 Document Revised: 04/22/2011 Document Reviewed: 03/16/2007 ExitCare Patient Information 2013 [employer].   

## 2014-12-07 ENCOUNTER — Other Ambulatory Visit: Payer: Self-pay | Admitting: Certified Nurse Midwife

## 2014-12-07 DIAGNOSIS — N760 Acute vaginitis: Principal | ICD-10-CM

## 2014-12-07 DIAGNOSIS — B9689 Other specified bacterial agents as the cause of diseases classified elsewhere: Secondary | ICD-10-CM

## 2014-12-07 LAB — WET PREP BY MOLECULAR PROBE
Candida species: NEGATIVE
GARDNERELLA VAGINALIS: POSITIVE — AB
Trichomonas vaginosis: NEGATIVE

## 2014-12-07 LAB — STD PANEL
HEP B S AG: NEGATIVE
HIV: NONREACTIVE

## 2014-12-07 MED ORDER — HYLAFEM VA SUPP: 1.0000 | Freq: Every day | VAGINAL | Status: DC

## 2014-12-07 NOTE — Progress Notes (Signed)
Encounter reviewed Donivin Wirt, MD   

## 2014-12-08 LAB — IPS N GONORRHOEA AND CHLAMYDIA BY PCR

## 2014-12-08 LAB — IPS PAP TEST WITH REFLEX TO HPV

## 2014-12-16 ENCOUNTER — Ambulatory Visit: Payer: BLUE CROSS/BLUE SHIELD

## 2014-12-16 ENCOUNTER — Telehealth: Payer: Self-pay | Admitting: Certified Nurse Midwife

## 2014-12-16 NOTE — Telephone Encounter (Signed)
Patient called to reschedule her Depo for today to Monday. She is unable to come in today due to family emergency and her being out of town. Patient rescheduled to 12/19/2014 at 3:30.

## 2014-12-19 ENCOUNTER — Ambulatory Visit (INDEPENDENT_AMBULATORY_CARE_PROVIDER_SITE_OTHER): Payer: BLUE CROSS/BLUE SHIELD | Admitting: *Deleted

## 2014-12-19 VITALS — BP 120/80 | HR 90 | Resp 16 | Ht 65.5 in

## 2014-12-19 DIAGNOSIS — Z3040 Encounter for surveillance of contraceptives, unspecified: Secondary | ICD-10-CM | POA: Diagnosis not present

## 2014-12-19 LAB — POCT URINE PREGNANCY: PREG TEST UR: NEGATIVE

## 2014-12-19 NOTE — Progress Notes (Signed)
Patient is here for Depo Provera Injection Patient is within Depo Provera Calender Limits: No, last Depo 09/15/14  Next Depo Due between:  Last AEX: 12/06/14 DL AEX Scheduled: Not scheduled   Patient late for Depo. Per Protocol. UPT today, abstain for 2 weeks, then UPT again. - Pt verbalized understanding.   Routed to provider for review, encounter closed.

## 2015-01-02 ENCOUNTER — Ambulatory Visit (INDEPENDENT_AMBULATORY_CARE_PROVIDER_SITE_OTHER): Payer: BLUE CROSS/BLUE SHIELD | Admitting: *Deleted

## 2015-01-02 VITALS — BP 120/86 | HR 88 | Resp 16 | Wt 109.0 lb

## 2015-01-02 DIAGNOSIS — Z309 Encounter for contraceptive management, unspecified: Secondary | ICD-10-CM | POA: Diagnosis not present

## 2015-01-02 LAB — POCT URINE PREGNANCY: PREG TEST UR: NEGATIVE

## 2015-01-02 MED ORDER — MEDROXYPROGESTERONE ACETATE 150 MG/ML IM SUSP
150.0000 mg | Freq: Once | INTRAMUSCULAR | Status: AC
  Administered 2015-01-02: 150 mg via INTRAMUSCULAR

## 2015-01-02 NOTE — Progress Notes (Signed)
Patient ID: Suzanne [name], female   DOB: 02/24/1990, 24 y.o.   MRN: [id number] Patient is here for Depo Provera Injection Patient is within Depo Provera Calender Limits:  last Depo 09/15/14 Patient had a UPT 12-19-14 NEG today UPT NEG Next Depo Due between: 03/20/15-04/03/15 Last AEX: 12/06/14 DL AEX Scheduled: Not scheduled   Patient tolerated injection well

## 2015-02-12 ENCOUNTER — Other Ambulatory Visit: Payer: Self-pay | Admitting: Internal Medicine

## 2015-02-12 DIAGNOSIS — F988 Other specified behavioral and emotional disorders with onset usually occurring in childhood and adolescence: Secondary | ICD-10-CM

## 2015-02-12 MED ORDER — DEXTROAMPHETAMINE SULFATE ER 10 MG PO CP24: ORAL_CAPSULE | ORAL | Status: DC

## 2015-02-21 ENCOUNTER — Telehealth: Payer: Self-pay | Admitting: Family Medicine

## 2015-02-21 NOTE — Telephone Encounter (Signed)
[facility] called regarding dextroamphetamine rx. RX written for # 120 however they only had #110 on hand so they wanted to let us know so we could document since she would probably request a new rx sooner

## 2015-02-22 ENCOUNTER — Ambulatory Visit (INDEPENDENT_AMBULATORY_CARE_PROVIDER_SITE_OTHER): Payer: BLUE CROSS/BLUE SHIELD | Admitting: Internal Medicine

## 2015-02-22 VITALS — BP 120/89 | HR 144 | Temp 98.0°F | Resp 20 | Ht 65.5 in | Wt 113.0 lb

## 2015-02-22 DIAGNOSIS — F329 Major depressive disorder, single episode, unspecified: Secondary | ICD-10-CM | POA: Diagnosis not present

## 2015-02-22 DIAGNOSIS — R000 Tachycardia, unspecified: Secondary | ICD-10-CM

## 2015-02-22 DIAGNOSIS — F988 Other specified behavioral and emotional disorders with onset usually occurring in childhood and adolescence: Secondary | ICD-10-CM

## 2015-02-22 DIAGNOSIS — F17200 Nicotine dependence, unspecified, uncomplicated: Secondary | ICD-10-CM

## 2015-02-22 DIAGNOSIS — F909 Attention-deficit hyperactivity disorder, unspecified type: Secondary | ICD-10-CM | POA: Diagnosis not present

## 2015-02-22 DIAGNOSIS — Z720 Tobacco use: Secondary | ICD-10-CM

## 2015-02-22 MED ORDER — FLUOXETINE HCL 10 MG PO CAPS: 10.0000 mg | ORAL_CAPSULE | Freq: Every day | ORAL | Status: DC

## 2015-02-22 MED ORDER — DEXTROAMPHETAMINE SULFATE ER 10 MG PO CP24: 10.0000 mg | ORAL_CAPSULE | Freq: Every day | ORAL | Status: DC

## 2015-02-22 MED ORDER — BUPROPION HCL ER (XL) 150 MG PO TB24: 150.0000 mg | ORAL_TABLET | Freq: Every day | ORAL | Status: DC

## 2015-02-22 MED ORDER — DEXTROAMPHETAMINE SULFATE ER 10 MG PO CP24: ORAL_CAPSULE | ORAL | Status: DC

## 2015-02-23 NOTE — Progress Notes (Signed)
Subjective:    Patient ID: Suzanne [name], female    DOB: 03-18-1990, 25 y.o.   MRN: [id number]  HPI Fu Patient Active Problem List   Diagnosis Date Noted  . Wolff-Parkinson-White syndrome---see our EKG last yr She has a history of rapid heartbeat evaluated by cardiology. She was told this was not dangerous and taught how to do Valsalva maneuvers. She has not had additional follow-up. Her younger brother had a procedure done in childhood because of tachyarrhythmias. She is unsure if this was an ablation. He is healthy now in adolescence. She rarely has cardiac symptoms that inhibited her activity level. She occasionally notes rapid heartbeat and is able to stop it with deep breathing. She is never had syncope or chest pain or shortness of breath or peripheral edema. Her activity level is not an issue. Because she remains asymptomatic we have not referred ablation 05/18/2014  . Smoker--Treating her own stress Her life is significantly stressful. She is estranged from her mother. 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 He does help now but lives in [place] and mainly does paperwork and is moving to [place] soon. 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 She admits smoking due to stress. She basically serves as her own parent and often has to take care of her adolescent brother. She has a slightly older brother who is in trouble with the law and is unable to sustain a work Financial controller. Her younger brother was addicted to video games and was failing at school, but has now responded to her care and is in college and stable.  05/18/2014  . ADD (attention deficit disorder)--stable on meds 08/18/2013   During the holidays she became more irritable and depressed and at her family's suggestion started therapy with Cameron Sprang at [facility] She is recommended to begin medication and her therapist would like her on both an SSRI and  Wellbutrin. She acknowledges that depression has interfered with her ability to work, her ability to have fun, and her ability to sleep well over the past few months. She does not feel hopeless and has no suicide ideation.  Still unable to drive Not ready to quit smoking  Review of Systems Wt Readings from Last 3 Encounters:  02/22/15 113 lb (51.256 kg)  01/02/15 109 lb (49.442 kg)  12/06/14 106 lb (48.081 kg)  No chest pain, dizziness, syncope, palpitations No recent illnesses See recent GYN evaluation     Objective:   Physical Exam  BP 120/89 mmHg  Pulse 144  Temp(Src) 98 F (36.7 C)  Resp 20  Ht 5' 5.5" (1.664 m)  Wt 113 lb (51.256 kg)  BMI 18.51 kg/m2 HEENT clear Neurological intact Mood stable Affect appropriate No flights of ideas No grandiosity No anger or irritability Optimistic     Assessment & Plan:  ADD (attention deficit disorder) - Plan: dextroamphetamine (DEXEDRINE) 10 MG 24 hr capsule  Tachycardia--WPW--note normal pulse at gyn  Smoker  Reactive depression  Meds ordered this encounter  Medications  . FLUoxetine (PROZAC) 10 MG capsule    Sig: Take 1 capsule (10 mg total) by mouth daily. After 1 week increase to 2 tabs daily    Dispense:  60 capsule    Refill:  2  . buPROPion (WELLBUTRIN XL) 150 MG 24 hr tablet    Sig: Take 1 tablet (150 mg total) by mouth daily. Start on or after 02/26/15    Dispense:  30  tablet    Refill:  2  . dextroamphetamine (DEXEDRINE) 10 MG 24 hr capsule    Sig: 2 capsules by mouth each morning, then 1-2 capsules by mouth in afternoon if needed.    Dispense:  120 capsule    Refill:  0    May fill 03/15/15 or after  . dextroamphetamine (DEXEDRINE) 10 MG 24 hr capsule    Sig: Take 1 capsule (10 mg total) by mouth daily. 2 in am , then 1-2 in afternoon as needed. For 04/12/15 or after    Dispense:  120 capsule    Refill:  0   She will contact me by phone or through my chart about response to medicines Continue  counseling Follow-up for medication review in 6 weeks Discussed follow-up with Windell Hummingbird after I retire

## 2015-03-18 ENCOUNTER — Encounter: Payer: Self-pay | Admitting: Internal Medicine

## 2015-03-20 ENCOUNTER — Other Ambulatory Visit: Payer: Self-pay | Admitting: Internal Medicine

## 2015-03-20 ENCOUNTER — Ambulatory Visit (INDEPENDENT_AMBULATORY_CARE_PROVIDER_SITE_OTHER): Payer: BLUE CROSS/BLUE SHIELD | Admitting: *Deleted

## 2015-03-20 ENCOUNTER — Ambulatory Visit: Payer: BLUE CROSS/BLUE SHIELD

## 2015-03-20 VITALS — BP 134/86 | HR 88 | Ht 65.5 in | Wt 113.0 lb

## 2015-03-20 DIAGNOSIS — F988 Other specified behavioral and emotional disorders with onset usually occurring in childhood and adolescence: Secondary | ICD-10-CM

## 2015-03-20 DIAGNOSIS — Z309 Encounter for contraceptive management, unspecified: Secondary | ICD-10-CM

## 2015-03-20 DIAGNOSIS — I471 Supraventricular tachycardia: Secondary | ICD-10-CM

## 2015-03-20 DIAGNOSIS — R000 Tachycardia, unspecified: Secondary | ICD-10-CM

## 2015-03-20 DIAGNOSIS — F329 Major depressive disorder, single episode, unspecified: Secondary | ICD-10-CM

## 2015-03-20 MED ORDER — FLUOXETINE HCL 20 MG PO CAPS: 20.0000 mg | ORAL_CAPSULE | Freq: Every day | ORAL | Status: DC

## 2015-03-20 MED ORDER — BUPROPION HCL ER (XL) 300 MG PO TB24: 300.0000 mg | ORAL_TABLET | Freq: Every day | ORAL | Status: DC

## 2015-03-20 MED ORDER — MEDROXYPROGESTERONE ACETATE 150 MG/ML IM SUSP
150.0000 mg | Freq: Once | INTRAMUSCULAR | Status: AC
  Administered 2015-03-20: 150 mg via INTRAMUSCULAR

## 2015-03-20 MED ORDER — LISDEXAMFETAMINE DIMESYLATE 50 MG PO CAPS
50.0000 mg | ORAL_CAPSULE | Freq: Every day | ORAL | Status: DC
Start: 2015-03-20 — End: 2015-04-28

## 2015-03-20 NOTE — Progress Notes (Signed)
Patient ID: Suzanne [name], female   DOB: 03-05-1990, 25 y.o.   MRN: [id number]  Pt arrived for Depo Provera injection.  Pt tolerated injection well in right gluteal. Last AEX - 12/06/14 Last Depo Provera Given - 01/02/15 Pt is within due dates. Pt should return between 06/05/15 and 06/19/15

## 2015-03-26 NOTE — Progress Notes (Signed)
Electrophysiology Office Note   Date:  03/26/2015   ID:  Suzanne [name], DOB 1991-01-10, MRN [id number]  PCP:  No PCP Per Patient Primary Electrophysiologist:  Milia Warth Meredith Leeds, MD    No chief complaint on file.    History of Present Illness: Suzanne [name] is a 25 y.o. female who presents today for electrophysiology evaluation.   She has a history of Wolff-Parkinson-White syndrome. She has been taught how to do Valsalva maneuvers in the past. She rarely has cardiac symptoms that inhibit her activity level. She occasionally does note rapid heart rate is able to stop it with deep breathing. She has never had syncope or chest pain or shortness of breath or edema. She has had 2 episodes of tachycardia since Christmas. She says at the worst one lasted about 2 hours. She tried multiple maneuvers to have the tachycardia break. Maneuvers included Valsalva, laying on her side, and putting her face in cold water. None of these maneuvers works until she took a shot of vodka with which stopped the tachycardia.   Today, she denies symptoms of palpitations, chest pain, shortness of breath, orthopnea, PND, lower extremity edema, claudication, dizziness, presyncope, syncope, bleeding, or neurologic sequela. The patient is tolerating medications without difficulties and is otherwise without complaint today.    Past Medical History  Diagnosis Date  . 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 Amenorrhea     on DepoProvera  . Scoliosis     idiopathic   . Abnormal Pap smear 09/2008, 2013    LGSIL  at age 22     Past Surgical History  Procedure Laterality Date  . Colposcopy  01/2010 & 02/2011     both were negative   . Wisdom tooth extraction       Current Outpatient Prescriptions  Medication Sig Dispense Refill  . buPROPion (WELLBUTRIN XL) 300 MG 24 hr tablet Take 1 tablet (300 mg total) by mouth daily. Start on or after 02/26/15 30 tablet 2  . FLUoxetine (PROZAC) 20 MG capsule  Take 1 capsule (20 mg total) by mouth daily. 30 capsule 3  . lisdexamfetamine (VYVANSE) 50 MG capsule Take 1 capsule (50 mg total) by mouth daily. 30 capsule 0  . medroxyPROGESTERone (DEPO-PROVERA) 150 MG/ML injection Inject 1 mL (150 mg total) into the muscle every 3 (three) months. 1 mL 4   No current facility-administered medications for this visit.    Allergies:   Review of patient's allergies indicates no known allergies.   Social History:  The patient  reports that she has been smoking Cigarettes.  She has a 1.5 pack-year smoking history. She has never used smokeless tobacco. She reports that she drinks alcohol. She reports that she does not use illicit drugs.   Family History:  The patient's family history includes Aplastic anemia in her brother; Cancer in her paternal grandfather and paternal grandmother; Diabetes in her paternal grandfather; Hyperlipidemia in her maternal grandfather; Hypertension in her maternal grandmother.    ROS:  Please see the history of present illness.   Otherwise, review of systems is positive for palpitations, depression.   All other systems are reviewed and negative.    PHYSICAL EXAM: VS:  There were no vitals taken for this visit. , BMI There is no weight on file to calculate BMI. GEN: Well nourished, well developed, in no acute distress HEENT: normal Neck: no JVD, carotid bruits, or masses Cardiac: RRR; no murmurs, rubs, or gallops,no edema  Respiratory:  clear to auscultation bilaterally,  normal work of breathing GI: soft, nontender, nondistended, + BS MS: no deformity or atrophy Skin: warm and dry Neuro:  Strength and sensation are intact Psych: euthymic mood, full affect  EKG:  EKG is ordered today. The ekg ordered today shows sinus tachycardia, ventricular pre-excitation  Recent Labs: 12/06/2014: Hemoglobin 14.9; Platelets 300; TSH 0.730    Lipid Panel  No results found for: CHOL, TRIG, HDL, CHOLHDL, VLDL, LDLCALC, LDLDIRECT   Wt  Readings from Last 3 Encounters:  03/20/15 113 lb (51.256 kg)  02/22/15 113 lb (51.256 kg)  01/02/15 109 lb (49.442 kg)    ASSESSMENT AND PLAN:  1.  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 I discussed with her the options of medical management versus ablation, and she said that she would prefer ablation as the ultimate treatment. I discussed the risks of the procedure which include bleeding, tamponade, heart block, and stroke, as well as damage to surrounding structures. She understands these risks and has agreed to the ablation. We'll schedule ablation at the soonest time.    Current medicines are reviewed at length with the patient today.   The patient does not have concerns regarding her medicines.  The following changes were made today:  none  Labs/ tests ordered today include:  No orders of the defined types were placed in this encounter.     Disposition:   FU with Lashana Spang post ablation  Signed, Kati Riggenbach Meredith Leeds, MD  03/26/2015 8:58 PM     [facility] [address] [place] [address] [phone] (office) [phone] (fax)

## 2015-03-27 ENCOUNTER — Encounter: Payer: Self-pay | Admitting: Cardiology

## 2015-03-27 ENCOUNTER — Ambulatory Visit (INDEPENDENT_AMBULATORY_CARE_PROVIDER_SITE_OTHER): Payer: BLUE CROSS/BLUE SHIELD | Admitting: Cardiology

## 2015-03-27 VITALS — BP 148/82 | HR 112 | Ht 66.0 in | Wt 114.4 lb

## 2015-03-27 DIAGNOSIS — Z01812 Encounter for preprocedural laboratory examination: Secondary | ICD-10-CM | POA: Diagnosis not present

## 2015-03-27 DIAGNOSIS — I471 Supraventricular tachycardia: Secondary | ICD-10-CM

## 2015-03-27 NOTE — Addendum Note (Signed)
Addended by: Allegra Lai M on: 03/27/2015 02:21 PM   Modules accepted: Orders

## 2015-03-27 NOTE — Patient Instructions (Signed)
Medication Instructions:  Your physician recommends that you continue on your current medications as directed. Please refer to the Current Medication list given to you today.   Labwork: Your physician recommends that you return for lab work on May 03, 2015. You do NOT need to be fasting for this lab work.  Testing/Procedures: Dr. Curt Bears recommends you have an SVT ABLATION.  Follow-Up: Your physician recommends that you have a follow-up appointment ON MARCH 22 at 11:00 with Suzanne [name].  Any Other Special Instructions Will Be Listed Below (If Applicable).  Paroxysmal Supraventricular Tachycardia Paroxysmal supraventricular tachycardia (PSVT) is when your heart beats very quickly and then suddenly stops beating so quickly. You may or may not have any symptoms when this occurs. It is usually not dangerous. It can lead to problems if it happens often or it lasts a long time. HOME CARE   Take medicines only as told by your doctor.  Avoid caffeine or limit how much of it you consume as told by your doctor. Caffeine is found in coffee, tea, soda, and chocolate.  Avoid alcohol or limit how much of it you drink as told by your doctor.  Do not smoke.  Try to get at least 7 hours of sleep each night.  Find healthy ways to reduce stress.  Do self-treatments as told by your doctor to slow down your heart (vagus nerve stimulation). Your doctor may tell you to:  Hold your breath and push, as though you are going to the bathroom.  Rub an area on one side of your neck.  Bend forward with your head between your legs.  Bend forward with your head between your legs, then cough.  Rub your eyeballs with your eyes closed.  Maintain a healthy weight.  Get some exercise on most days. Ask your doctor about some good activities for you. GET HELP IF:  You are having episodes of a fast heartbeat more often.  Your episodes are lasting longer.  Your self-treatments to slow down your heart are  no longer helping.  You have new symptoms during an episode. GET HELP RIGHT AWAY IF:  You have chest pain.  You have trouble breathing.  You have an episode of a fast heartbeat that lasts longer than 20 minutes.  You pass out (faint). These symptoms may be an emergency. Do not wait to see if the symptoms will go away. Get medical help right away. Call your local emergency services (911 in the [place] Do not drive yourself to the hospital.   This information is not intended to replace advice given to you by your health care provider. Make sure you discuss any questions you have with your health care provider.   Document Released: 01/28/2005 Document Revised: 02/18/2014 Document Reviewed: 07/08/2013 Elsevier Interactive Patient Education 2016 Elsevier Inc. Cardiac Ablation Cardiac ablation is a procedure to disable a small amount of heart tissue in very specific places. The heart has many electrical connections. Sometimes these connections are abnormal and can cause the heart to beat very fast or irregularly. By disabling some of the problem areas, heart rhythm can be improved or made normal. Ablation is done for people who:   Have Wolff-Parkinson-White syndrome.   Have other fast heart rhythms (tachycardia).   Have taken medicines for an abnormal heart rhythm (arrhythmia) that resulted in:   No success.   Side effects.   May have a high-risk heartbeat that could result in death.  LET [facility] CARE PROVIDER KNOW ABOUT:   Any  allergies you have or any previous reactions you have had to X-ray dye, food (such as seafood), medicine, or tape.   All medicines you are taking, including vitamins, herbs, eye drops, creams, and over-the-counter medicines.   Previous problems you or members of your family have had with the use of anesthetics.   Any blood disorders you have.   Previous surgeries or procedures (such as a kidney transplant) you have had.   Medical conditions  you have (such as kidney failure).  RISKS AND COMPLICATIONS Generally, cardiac ablation is a safe procedure. However, problems can occur and include:   Increased risk of cancer. Depending on how long it takes to do the ablation, the dose of radiation can be high.  Bruising and bleeding where a thin, flexible tube (catheter) was inserted during the procedure.   Bleeding into the chest, especially into the sac that surrounds the heart (serious).  Need for a permanent pacemaker if the normal electrical system is damaged.   The procedure may not be fully effective, and this may not be recognized for months. Repeat ablation procedures are sometimes required. BEFORE THE PROCEDURE   Follow any instructions from your health care provider regarding eating and drinking before the procedure.   Take your medicines as directed at regular times with water, unless instructed otherwise by your health care provider. If you are taking diabetes medicine, including insulin, ask how you are to take it and if there are any special instructions you should follow. It is common to adjust insulin dosing the day of the ablation.  PROCEDURE  An ablation is usually performed in a catheterization laboratory with the guidance of fluoroscopy. Fluoroscopy is a type of X-ray that helps your health care provider see images of your heart during the procedure.   An ablation is a minimally invasive procedure. This means a small cut (incision) is made in either your neck or groin. Your health care provider will decide where to make the incision based on your medical history and physical exam.  An IV tube will be started before the procedure begins. You will be given an anesthetic or medicine to help you relax (sedative).  The skin on your neck or groin will be numbed. A needle will be inserted into a large vein in your neck or groin and catheters will be threaded to your heart.  A special dye that shows up on  fluoroscopy pictures may be injected through the catheter. The dye helps your health care provider see the area of the heart that needs treatment.  The catheter has electrodes on the tip. When the area of heart tissue that is causing the arrhythmia is found, the catheter tip will send an electrical current to the area and "scar" the tissue. Three types of energy can be used to ablate the heart tissue:   Heat (radiofrequency energy).   Laser energy.   Extreme cold (cryoablation).   When the area of the heart has been ablated, the catheter will be taken out. Pressure will be held on the insertion site. This will help the insertion site clot and keep it from bleeding. A bandage will be placed on the insertion site.  AFTER THE PROCEDURE   After the procedure, you will be taken to a recovery area where your vital signs (blood pressure, heart rate, and breathing) will be monitored. The insertion site will also be monitored for bleeding.   You will need to lie still for 4-6 hours. This is to ensure you  do not bleed from the catheter insertion site.    This information is not intended to replace advice given to you by your health care provider. Make sure you discuss any questions you have with your health care provider.   Document Released: 06/16/2008 Document Revised: 02/18/2014 Document Reviewed: 06/22/2012 Elsevier Interactive Patient Education [employer].    If you need a refill on your cardiac medications before your next appointment, please call your pharmacy.

## 2015-04-27 ENCOUNTER — Other Ambulatory Visit: Payer: Self-pay | Admitting: Internal Medicine

## 2015-04-28 ENCOUNTER — Other Ambulatory Visit: Payer: Self-pay | Admitting: Internal Medicine

## 2015-04-28 MED ORDER — LISDEXAMFETAMINE DIMESYLATE 50 MG PO CAPS: 50.0000 mg | ORAL_CAPSULE | Freq: Every day | ORAL | Status: DC

## 2015-04-28 NOTE — Addendum Note (Signed)
Addended by: Leandrew Koyanagi on: 04/28/2015 07:24 PM   Modules accepted: Orders, Medications

## 2015-05-02 ENCOUNTER — Telehealth: Payer: Self-pay | Admitting: *Deleted

## 2015-05-02 NOTE — Progress Notes (Signed)
Cardiology Office Note Date:  05/03/2015  Patient ID:  Suzanne [name] 11-Feb-1991, MRN [id number] PCP:  Leandrew Koyanagi, MD  Electrophysiologist: Dr. Curt Bears   Chief Complaint:  Planned f/u visit  History of Present Illness: Suzanne [name] is a 25 y.o. female with history of WPW, with palpitations, no near syncope or syncope.  She some to the office today being seen for Dr. Curt Bears, is planned for EPS/ablation procedure 05/17/15  She is feeling well, has not had palpitations, no SOB, no dizziness, near syncope or syncope.  No recent illness, fever.  She mentioned an episode of CP that was exacerbated by twisting motion of her torso, she stated felt like a muscle cramp that self resolved, no other c/o CP.   Past Medical History  Diagnosis Date  . 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 Amenorrhea     on DepoProvera  . Scoliosis     idiopathic   . Abnormal Pap smear 09/2008, 2013    LGSIL  at age 69      Past Surgical History  Procedure Laterality Date  . Colposcopy  01/2010 & 02/2011     both were negative   . Wisdom tooth extraction      Current Outpatient Prescriptions  Medication Sig Dispense Refill  . buPROPion (WELLBUTRIN XL) 300 MG 24 hr tablet Take 1 tablet (300 mg total) by mouth daily. Start on or after 02/26/15 30 tablet 2  . dextroamphetamine (DEXEDRINE SPANSULE) 10 MG 24 hr capsule TK 2 CS PO QAM AND 1 - 2 CS   IN THE AFTERNOON PRN  0  . FLUoxetine (PROZAC) 20 MG capsule Take 1 capsule (20 mg total) by mouth daily. 30 capsule 3  . lisdexamfetamine (VYVANSE) 50 MG capsule Take 1 capsule (50 mg total) by mouth daily. To be filled 05/20/15 or after 30 capsule 0  . medroxyPROGESTERone (DEPO-PROVERA) 150 MG/ML injection Inject 1 mL (150 mg total) into the muscle every 3 (three) months. 1 mL 4   No current facility-administered medications for this visit.    Allergies:   Review of patient's allergies indicates no known allergies.   Social History:   The patient  reports that she has been smoking Cigarettes.  She has a 1.5 pack-year smoking history. She has never used smokeless tobacco. She reports that she drinks alcohol. She reports that she does not use illicit drugs.   Family History:  The patient's family history includes Aplastic anemia in her brother; Cancer in her paternal grandfather and paternal grandmother; Diabetes in her paternal grandfather; Hyperlipidemia in her maternal grandfather; Hypertension in her maternal grandmother. She has a brother who had WPW ablated   ROS:  Please see the history of present illness.    All other systems are reviewed and otherwise negative.   PHYSICAL EXAM:  VS:  BP 144/92 mmHg  Pulse 113  Ht 5\' 6"  (1.676 m)  Wt 115 lb (52.164 kg)  BMI 18.57 kg/m2 BMI: Body mass index is 18.57 kg/(m^2). Well nourished, well developed, in no acute distress HEENT: normocephalic, atraumatic Neck: no JVD, carotid bruits or masses Cardiac:  normal S1, S2; RRR; no significant murmurs, no rubs, or gallops Lungs:  clear to auscultation bilaterally, no wheezing, rhonchi or rales Abd: soft, nontender MS: no deformity or atrophy Ext: no edema Skin: warm and dry, no rash Neuro:  No gross deficits appreciated Psych: euthymic mood, full affect   EKG:  Done today shows ST, 113bpm, + preexcitation, appears unchanged  from her previoius  Recent Labs: 12/06/2014: Hemoglobin 14.9; Platelets 300; TSH 0.730  No results found for requested labs within last 365 days.   CrCl cannot be calculated (Patient has no serum creatinine result on file.).   Wt Readings from Last 3 Encounters:  05/03/15 115 lb (52.164 kg)  03/27/15 114 lb 6.4 oz (51.891 kg)  03/20/15 113 lb (51.256 kg)     Other studies reviewed: Additional studies/records reviewed today include: summarized above  ASSESSMENT AND PLAN:  1. WPW, palpitations      Scheduled for EPS/Ablation with Dr. Curt Bears 05/17/15      Dr. Curt Bears had discussed with her  risk/benefits of the procedure, discussed with her again today, she wishes to proceed  2. Tobacco abuse      counseled  Disposition: pre-procedure labs, f/u 4 weeks post procedure with Dr. Curt Bears, sooner if needed.  Current medicines are reviewed at length with the patient today.  The patient did not have any concerns regarding medicines.  Haywood Lasso, PA-C 05/03/2015 11:38 AM     [facility] [place] [address] [phone] (office)  [phone] (fax)

## 2015-05-02 NOTE — Telephone Encounter (Signed)
Informed patient of change for procedure currently scheduled for 3/31. Moved procedure date to 4/5, patient aware to arrive at 8:30 a.m. She will come to office tomorrow for H&P and pre procedure labs w/ Tommye Standard, PA. Patient verbalized understanding and agreeable to plan.

## 2015-05-03 ENCOUNTER — Encounter: Payer: Self-pay | Admitting: *Deleted

## 2015-05-03 ENCOUNTER — Other Ambulatory Visit: Payer: BLUE CROSS/BLUE SHIELD

## 2015-05-03 ENCOUNTER — Encounter: Payer: Self-pay | Admitting: Physician Assistant

## 2015-05-03 ENCOUNTER — Encounter: Payer: Self-pay | Admitting: Internal Medicine

## 2015-05-03 ENCOUNTER — Ambulatory Visit (INDEPENDENT_AMBULATORY_CARE_PROVIDER_SITE_OTHER): Payer: BLUE CROSS/BLUE SHIELD | Admitting: Physician Assistant

## 2015-05-03 ENCOUNTER — Ambulatory Visit: Payer: Self-pay | Admitting: Internal Medicine

## 2015-05-03 ENCOUNTER — Ambulatory Visit (INDEPENDENT_AMBULATORY_CARE_PROVIDER_SITE_OTHER): Payer: BLUE CROSS/BLUE SHIELD | Admitting: Internal Medicine

## 2015-05-03 ENCOUNTER — Other Ambulatory Visit: Payer: Self-pay | Admitting: Physician Assistant

## 2015-05-03 VITALS — BP 144/92 | HR 113 | Ht 66.0 in | Wt 115.0 lb

## 2015-05-03 VITALS — BP 130/90 | HR 125 | Temp 98.0°F | Resp 18 | Ht 66.5 in | Wt 113.0 lb

## 2015-05-03 DIAGNOSIS — F909 Attention-deficit hyperactivity disorder, unspecified type: Secondary | ICD-10-CM | POA: Diagnosis not present

## 2015-05-03 DIAGNOSIS — I456 Pre-excitation syndrome: Secondary | ICD-10-CM

## 2015-05-03 DIAGNOSIS — F988 Other specified behavioral and emotional disorders with onset usually occurring in childhood and adolescence: Secondary | ICD-10-CM

## 2015-05-03 DIAGNOSIS — I471 Supraventricular tachycardia: Secondary | ICD-10-CM | POA: Diagnosis not present

## 2015-05-03 DIAGNOSIS — F17200 Nicotine dependence, unspecified, uncomplicated: Secondary | ICD-10-CM

## 2015-05-03 DIAGNOSIS — F329 Major depressive disorder, single episode, unspecified: Secondary | ICD-10-CM

## 2015-05-03 DIAGNOSIS — Z720 Tobacco use: Secondary | ICD-10-CM | POA: Diagnosis not present

## 2015-05-03 DIAGNOSIS — F331 Major depressive disorder, recurrent, moderate: Secondary | ICD-10-CM | POA: Insufficient documentation

## 2015-05-03 LAB — BASIC METABOLIC PANEL
BUN: 10 mg/dL (ref 7–25)
CO2: 20 mmol/L (ref 20–31)
Calcium: 10.1 mg/dL (ref 8.6–10.2)
Chloride: 103 mmol/L (ref 98–110)
Creat: 0.66 mg/dL (ref 0.50–1.10)
GLUCOSE: 99 mg/dL (ref 65–99)
POTASSIUM: 4 mmol/L (ref 3.5–5.3)
Sodium: 139 mmol/L (ref 135–146)

## 2015-05-03 LAB — CBC
HEMATOCRIT: 41.2 % (ref 36.0–46.0)
Hemoglobin: 14.5 g/dL (ref 12.0–15.0)
MCH: 34.1 pg — AB (ref 26.0–34.0)
MCHC: 35.2 g/dL (ref 30.0–36.0)
MCV: 96.9 fL (ref 78.0–100.0)
MPV: 9.9 fL (ref 8.6–12.4)
Platelets: 308 10*3/uL (ref 150–400)
RBC: 4.25 MIL/uL (ref 3.87–5.11)
RDW: 13.7 % (ref 11.5–15.5)
WBC: 5.8 10*3/uL (ref 4.0–10.5)

## 2015-05-03 MED ORDER — FLUOXETINE HCL 40 MG PO CAPS: 40.0000 mg | ORAL_CAPSULE | Freq: Every day | ORAL | Status: DC

## 2015-05-03 MED ORDER — LISDEXAMFETAMINE DIMESYLATE 70 MG PO CAPS: 70.0000 mg | ORAL_CAPSULE | Freq: Every day | ORAL | Status: DC

## 2015-05-03 NOTE — Addendum Note (Signed)
Addended by: Claude Manges on: 05/03/2015 11:55 AM   Modules accepted: Orders

## 2015-05-03 NOTE — Patient Instructions (Addendum)
Medication Instructions:   Your physician recommends that you continue on your current medications as directed. Please refer to the Current Medication list given to you today.   If you need a refill on your cardiac medications before your next appointment, please call your pharmacy.  Labwork:  NONE ORDER TODAY   Testing/Procedures: NONE ORDER TODAY    Follow-Up:  4 WEEKS  WITH DR CAMNITZ   Any Other Special Instructions Will Be Listed Below (If Applicable).

## 2015-05-03 NOTE — Progress Notes (Signed)
   Subjective:    Patient ID: Suzanne [name], female    DOB: [birth year], 25 y.o.   MRN: [id number]  HPI no recent medical changes mostly administered through my chart. She has entered therapy and is started on first Wellbutrin and then Prozac in an attempt to gain control over her depression and anxiety. She is changed from Dexedrine to Vyvanse. Despite these things she is still not doing well. She complains of being overwhelmed by work stressors, family interactions, responsibilities at home, finances, and interactions with people in general. Adding Wellbutrin has not stabilized her depression and she still has excessive fatigue sleepiness, avoidance of social activities, and in hedonism. She also has periods of anxiety have not improved with the addition of Prozac. There is a lot of anger and irritability. She interprets any feedback is negative and has a very poor sense of self.  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    Review of Systems No headaches or vision changes No trouble breathing Tachycardia persists No GI or GU symptoms No menstrual irregularities    Objective:   Physical Exam BP 130/90 mmHg  Pulse 125  Temp(Src) 98 F (36.7 C)  Resp 18  Ht 5' 6.5" (1.689 m)  Wt 113 lb (51.256 kg)  BMI 17.97 kg/m2 HEENT clear Persistent tachycardia Extremities clear Cranial nerves II through XII are intact Her mood is stable Her affect is appropriately concerned about her current state of being overloaded There are no flights of ideas or grandiosity Her thought content is normal Her outlook is pessimistic but there is no self injury She describes a lot of obsessive thinking about her problems and once she  begins to focus on negative issues she cannot get these off her mind and it interferes with work and sleep      Assessment & Plan:  Problem #1 attention deficit disorder Problem #2 WPW syndrome--ablation soon Problem #3 smoker--- too stressed to quit Problem #4 reactive depression and anxiety Problem #5 obsessive thinking  Plan -Names given for 3-new therapist options -Prozac increased to 40 mg and if not better in one month we'll increase to 60 mg--she can communicate by my chart -She is given Vyvanse 70 mg #7 as a trial and will call me or message me in 7 days for the next step//if this is unsuccessful we will consider returning to Dexedrine at a slightly higher dose -It is stressed that she needs therapy -It is stressed that she needs a 5 year life plan and to begin to get rid of her most common stress ors -Consider valerian root for anxiety  Follow-up will be here with Windell Hummingbird after I retire//

## 2015-05-04 LAB — PROTIME-INR
INR: 0.92 (ref <1.50)
PROTHROMBIN TIME: 12.5 s (ref 11.6–15.2)

## 2015-05-04 LAB — HCG, QUANTITATIVE, PREGNANCY: hCG, Beta Chain, Quant, S: <2 m[IU]/mL

## 2015-05-08 ENCOUNTER — Telehealth: Payer: Self-pay | Admitting: *Deleted

## 2015-05-08 NOTE — Telephone Encounter (Signed)
-----   Message from [facility], [place] sent at 05/04/2015  1:31 PM EDT ----- Please let the patient know her labs lok good for her planned procedure.  Thanks Tommye Standard, PA-C

## 2015-05-11 ENCOUNTER — Telehealth: Payer: Self-pay | Admitting: *Deleted

## 2015-05-11 NOTE — Telephone Encounter (Signed)
-----   Message from [facility], [place] sent at 05/04/2015  1:31 PM EDT ----- Please let the patient know her labs lok good for her planned procedure.  Thanks Tommye Standard, PA-C

## 2015-05-17 ENCOUNTER — Encounter (HOSPITAL_COMMUNITY)
Admission: RE | Disposition: A | Discharge status: Home / Self Care | Payer: Self-pay | Source: Ambulatory Visit | Attending: Cardiology

## 2015-05-17 ENCOUNTER — Ambulatory Visit (HOSPITAL_COMMUNITY)
Admission: RE | Admit: 2015-05-17 | Discharge: 2015-05-17 | Disposition: A | Discharge status: Home / Self Care | Payer: BLUE CROSS/BLUE SHIELD | Source: Ambulatory Visit | Attending: Cardiology | Admitting: Cardiology

## 2015-05-17 ENCOUNTER — Encounter (HOSPITAL_COMMUNITY): Payer: Self-pay | Admitting: Cardiology

## 2015-05-17 DIAGNOSIS — I471 Supraventricular tachycardia, unspecified: Secondary | ICD-10-CM | POA: Diagnosis present

## 2015-05-17 HISTORY — PX: ELECTROPHYSIOLOGIC STUDY: SHX172A

## 2015-05-17 LAB — PREGNANCY, URINE: PREG TEST UR: NEGATIVE

## 2015-05-17 SURGERY — A-FLUTTER/A-TACH/SVT ABLATION

## 2015-05-17 MED ORDER — BUPROPION HCL ER (XL) 300 MG PO TB24: 300.0000 mg | ORAL_TABLET | Freq: Every day | ORAL | Status: DC

## 2015-05-17 MED ORDER — MIDAZOLAM HCL 5 MG/5ML IJ SOLN
INTRAMUSCULAR | Status: AC
  Filled 2015-05-17: qty 5

## 2015-05-17 MED ORDER — ACETAMINOPHEN 325 MG PO TABS: 650.0000 mg | ORAL_TABLET | ORAL | Status: DC | PRN

## 2015-05-17 MED ORDER — FENTANYL CITRATE (PF) 100 MCG/2ML IJ SOLN
INTRAMUSCULAR | Status: AC
  Filled 2015-05-17: qty 2

## 2015-05-17 MED ORDER — MIDAZOLAM HCL 5 MG/5ML IJ SOLN
INTRAMUSCULAR | Status: DC | PRN
  Administered 2015-05-17 (×9): 1 mg via INTRAVENOUS
  Administered 2015-05-17: 2 mg via INTRAVENOUS
  Administered 2015-05-17: 1 mg via INTRAVENOUS

## 2015-05-17 MED ORDER — FLUOXETINE HCL 20 MG PO CAPS: 40.0000 mg | ORAL_CAPSULE | Freq: Every day | ORAL | Status: DC

## 2015-05-17 MED ORDER — HEPARIN (PORCINE) IN NACL 2-0.9 UNIT/ML-% IJ SOLN
INTRAMUSCULAR | Status: AC
Start: 2015-05-17 — End: 2015-05-17
  Filled 2015-05-17: qty 500

## 2015-05-17 MED ORDER — BUPIVACAINE HCL (PF) 0.25 % IJ SOLN
INTRAMUSCULAR | Status: AC
  Filled 2015-05-17: qty 60

## 2015-05-17 MED ORDER — FENTANYL CITRATE (PF) 100 MCG/2ML IJ SOLN
INTRAMUSCULAR | Status: DC | PRN
  Administered 2015-05-17 (×4): 25 ug via INTRAVENOUS

## 2015-05-17 MED ORDER — SODIUM CHLORIDE 0.9% FLUSH: 3.0000 mL | INTRAVENOUS | Status: DC | PRN

## 2015-05-17 MED ORDER — ONDANSETRON HCL 4 MG/2ML IJ SOLN: 4.0000 mg | Freq: Four times a day (QID) | INTRAMUSCULAR | Status: DC | PRN

## 2015-05-17 MED ORDER — LISDEXAMFETAMINE DIMESYLATE 70 MG PO CAPS: 70.0000 mg | ORAL_CAPSULE | Freq: Every day | ORAL | Status: DC

## 2015-05-17 MED ORDER — SODIUM CHLORIDE 0.9% FLUSH
3.0000 mL | Freq: Two times a day (BID) | INTRAVENOUS | Status: DC
  Administered 2015-05-17: 3 mL via INTRAVENOUS

## 2015-05-17 MED ORDER — ISOPROTERENOL HCL 0.2 MG/ML IJ SOLN
INTRAMUSCULAR | Status: AC
  Filled 2015-05-17: qty 5

## 2015-05-17 MED ORDER — BUPIVACAINE HCL (PF) 0.25 % IJ SOLN
INTRAMUSCULAR | Status: DC | PRN
  Administered 2015-05-17: 57 mL

## 2015-05-17 MED ORDER — SODIUM CHLORIDE 0.9 % IV SOLN
1.0000 mg | INTRAVENOUS | Status: DC | PRN
  Administered 2015-05-17: 1 ug/min via INTRAVENOUS

## 2015-05-17 MED ORDER — HEPARIN (PORCINE) IN NACL 2-0.9 UNIT/ML-% IJ SOLN
INTRAMUSCULAR | Status: DC | PRN
  Administered 2015-05-17: 12:00:00

## 2015-05-17 MED ORDER — SODIUM CHLORIDE 0.9 % IV SOLN: 250.0000 mL | INTRAVENOUS | Status: DC | PRN

## 2015-05-17 SURGICAL SUPPLY — 14 items
BAG SNAP BAND KOVER 36X36 (MISCELLANEOUS) ×3 IMPLANT
CATH EZ STEER NAV 4MM D-F CUR (ABLATOR) ×2 IMPLANT
CATH JOSEPHSON QUAD-ALLRED 6FR (CATHETERS) ×4 IMPLANT
CATH WEBSTER BI DIR CS D-F CRV (CATHETERS) ×2 IMPLANT
INTRODUCER SWARTZ SRO 8F (SHEATH) ×2 IMPLANT
PACK EP LATEX FREE (CUSTOM PROCEDURE TRAY) ×3
PACK EP LF (CUSTOM PROCEDURE TRAY) ×1 IMPLANT
PAD DEFIB LIFELINK (PAD) ×3 IMPLANT
PATCH CARTO3 (PAD) ×2 IMPLANT
SHEATH PINNACLE 6F 10CM (SHEATH) ×2 IMPLANT
SHEATH PINNACLE 7F 10CM (SHEATH) ×4 IMPLANT
SHEATH PINNACLE 8F 10CM (SHEATH) ×2 IMPLANT
SHEATH PINNACLE 9F 10CM (SHEATH) ×2 IMPLANT
SHIELD RADPAD SCOOP 12X17 (MISCELLANEOUS) ×3 IMPLANT

## 2015-05-17 NOTE — Discharge Instructions (Signed)
No driving for  4 days. No lifting over 5 lbs for 1 week. No vigorous or sexual activity for 1 week. You may return to work on 05/24/15. Keep procedure site clean & dry. If you notice increased pain, swelling, bleeding or pus, call/return!  You may shower, but no soaking baths/hot tubs/pools for 1 week.

## 2015-05-17 NOTE — H&P (Signed)
Suzanne [name] has a history of palpitations with a delta wave on her ECG.  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  She presents today for EP study and ablation.  Risks and benefits explained.  Risks include but are not limited to bleeding, tamponade, pneumothorax, and heart block.  Patient understands these risks and has agreed to the procedure.   Will Curt Bears, MD 05/17/2015 9:04 AM

## 2015-05-17 NOTE — Progress Notes (Signed)
Pt ambulated in the hallway, no dizziness noted, pt's bilateral groin site looks fine, no bleeding noted, dressings clean, dry and intact.

## 2015-05-17 NOTE — Progress Notes (Signed)
Patient is seen post procedure by Dr. Curt Bears, [place] to discharge today after her bedrest is completed. VSS, telemetry is SR Activity restrictions and instructions were reviewed with the patient.  Suzanne Standard, PA-C  Discharge post bed rest.  Allegra Lai, MD

## 2015-05-17 NOTE — Progress Notes (Signed)
Site area: RFV x2 /LfV x 2 Site Prior to Removal:  Level 0 / 0 Pressure Applied For:20 min each side Manual:  yes  Patient Status During Pull: stable  Post Pull Site:  Level 0 / 0 Post Pull Instructions Given: yes  Post Pull Pulses Present: N/A Dressing Applied:  tegaderm Bedrest begins @ [id number] till [place] by Georga Kaufmann right by canderson

## 2015-05-17 NOTE — Progress Notes (Signed)
Discussed discharged instructions with patient. Patient states she understood doctor's instructions and has no questions. Peripheral IV removed with catheter intact and covered site with gauze and tape.  Patient complains of no pain or discomfort. Patient assisted to car via wheelchair and is discharged home.

## 2015-05-26 ENCOUNTER — Encounter: Payer: Self-pay | Admitting: Internal Medicine

## 2015-05-29 ENCOUNTER — Ambulatory Visit: Payer: BLUE CROSS/BLUE SHIELD | Admitting: Cardiology

## 2015-05-29 ENCOUNTER — Telehealth: Payer: Self-pay

## 2015-05-29 MED ORDER — LISDEXAMFETAMINE DIMESYLATE 70 MG PO CAPS: 70.0000 mg | ORAL_CAPSULE | Freq: Every day | ORAL | Status: DC

## 2015-05-29 NOTE — Telephone Encounter (Signed)
Meds ordered this encounter  Medications  . lisdexamfetamine (VYVANSE) 70 MG capsule    Sig: Take 1 capsule (70 mg total) by mouth daily.    Dispense:  30 capsule    Refill:  0  one mo trial

## 2015-05-29 NOTE — Telephone Encounter (Signed)
Patient is calling because the adderral prescription is supposed to be for 75 mg and not 50. Please advise!

## 2015-05-30 NOTE — Telephone Encounter (Signed)
My chart message sent about this already.

## 2015-05-31 MED ORDER — LISDEXAMFETAMINE DIMESYLATE 70 MG PO CAPS: 70.0000 mg | ORAL_CAPSULE | Freq: Every day | ORAL | Status: DC

## 2015-05-31 NOTE — Addendum Note (Signed)
Addended by: Leandrew Koyanagi on: 05/31/2015 01:03 PM   Modules accepted: Orders

## 2015-05-31 NOTE — Telephone Encounter (Signed)
Meds ordered this encounter  Medications  . DISCONTD: lisdexamfetamine (VYVANSE) 70 MG capsule    Sig: Take 1 capsule (70 mg total) by mouth daily.    Dispense:  30 capsule    Refill:  0  . lisdexamfetamine (VYVANSE) 70 MG capsule    Sig: Take 1 capsule (70 mg total) by mouth daily.    Dispense:  30 capsule    Refill:  0

## 2015-06-02 ENCOUNTER — Telehealth: Payer: Self-pay

## 2015-06-02 NOTE — Telephone Encounter (Signed)
Suzanne [name] - Patients brother showed up to pick up his sister's medication.  He was supposed to bring in the bottle of 50 mg vyvanse to exchange for the correct paper prescription of 75 mg vyvanse.  He did not bring in the bottle with him and I asked him for it due to the note left.  He said he forgot to bring it in, went out and got the bottle.  I was suspicious when I saw the bottle of pills so I went to [facility] to make sure of what to do.  The notes in mychart said the patient would bring the bottle of pills in within 2 days.  The message was put in on 05-31-15, the same day the script was written.  Today, two days later, instead of only 2 pills missing, there were 10 missing.  Clark counted himself and made the judgement until the correct number of pills were turned in or until [place] could review these notes, then we could not give him the prescription.  I told the brother this and that Suzanne [name] would be here Saturday morning.

## 2015-06-04 ENCOUNTER — Encounter: Payer: Self-pay | Admitting: Internal Medicine

## 2015-06-05 ENCOUNTER — Ambulatory Visit: Payer: BLUE CROSS/BLUE SHIELD

## 2015-06-06 ENCOUNTER — Ambulatory Visit (INDEPENDENT_AMBULATORY_CARE_PROVIDER_SITE_OTHER): Payer: BLUE CROSS/BLUE SHIELD

## 2015-06-06 VITALS — BP 126/78 | HR 96 | Resp 16 | Ht 66.0 in | Wt 117.0 lb

## 2015-06-06 DIAGNOSIS — Z3049 Encounter for surveillance of other contraceptives: Secondary | ICD-10-CM | POA: Diagnosis not present

## 2015-06-06 MED ORDER — MEDROXYPROGESTERONE ACETATE 150 MG/ML IM SUSP
150.0000 mg | Freq: Once | INTRAMUSCULAR | Status: AC
  Administered 2015-06-06: 150 mg via INTRAMUSCULAR

## 2015-06-06 NOTE — Progress Notes (Signed)
Pt arrived for Depo Provera injection. Pt tolerated injection well in right gluteal. Last AEX - 12/06/14 Last Depo Provera Given - 03/20/15 Pt is within due dates. Pt should return between 08/22/15 - 09/05/15

## 2015-06-07 ENCOUNTER — Other Ambulatory Visit: Payer: Self-pay | Admitting: Internal Medicine

## 2015-06-18 NOTE — Progress Notes (Signed)
Electrophysiology Office Note   Date:  06/19/2015   ID:  Suzanne [name], DOB Feb 09, 1991, MRN [id number]  PCP:  Leandrew Koyanagi, MD Primary Electrophysiologist:  Constance Haw, MD    Chief Complaint  Patient presents with  . Follow-up    post ablation     History of Present Illness: Suzanne [name] is a 25 y.o. female who presents today for electrophysiology evaluation.   She has a history of Wolff-Parkinson-White syndrome. She had ablation done 05/17/15.  Her EKG today shows a return of the pathway. It is very similar to her EKG prior to her ablation. She says that she continues to feel fatigued without much change in her symptoms from prior to the ablation.  Today, she denies symptoms of palpitations, chest pain, shortness of breath, orthopnea, PND, lower extremity edema, claudication, dizziness, presyncope, syncope, bleeding, or neurologic sequela. The patient is tolerating medications without difficulties and is otherwise without complaint today.    Past Medical History  Diagnosis Date  . 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 Amenorrhea     on DepoProvera  . Scoliosis     idiopathic   . Abnormal Pap smear 09/2008, 2013    LGSIL  at age 76     Past Surgical History  Procedure Laterality Date  . Colposcopy  01/2010 & 02/2011     both were negative   . Wisdom tooth extraction    . Electrophysiologic study N/A 05/17/2015    Procedure: SVT Ablation;  Surgeon: Shirlyn Savin Meredith Leeds, MD;  Location: [facility];  Service: Cardiovascular;  Laterality: N/A;     Current Outpatient Prescriptions  Medication Sig Dispense Refill  . buPROPion (WELLBUTRIN XL) 300 MG 24 hr tablet TAKE 1 TABLET(300 MG) BY MOUTH DAILY. START ON OR AFTER 02/26/15 30 tablet 0  . FLUoxetine (PROZAC) 40 MG capsule Take 1 capsule (40 mg total) by mouth daily. 30 capsule 1  . ibuprofen (ADVIL,MOTRIN) 200 MG tablet Take 600 mg by mouth daily as needed for moderate pain.    Marland Kitchen  lisdexamfetamine (VYVANSE) 70 MG capsule Take 1 capsule (70 mg total) by mouth daily. 30 capsule 0  . medroxyPROGESTERone (DEPO-PROVERA) 150 MG/ML injection Inject 1 mL (150 mg total) into the muscle every 3 (three) months. 1 mL 4   No current facility-administered medications for this visit.    Allergies:   Review of patient's allergies indicates no known allergies.   Social History:  The patient  reports that she has been smoking Cigarettes.  She has a 1.5 pack-year smoking history. She has never used smokeless tobacco. She reports that she drinks alcohol. She reports that she does not use illicit drugs.   Family History:  The patient's family history includes Aplastic anemia in her brother; Cancer in her paternal grandfather and paternal grandmother; Diabetes in her paternal grandfather; Hyperlipidemia in her maternal grandfather; Hypertension in her maternal grandmother.    ROS:  Please see the history of present illness.   Otherwise, review of systems is positive for fatigue, DOE, anxiety, bruising   All other systems are reviewed and negative.    PHYSICAL EXAM: VS:  BP 140/98 mmHg  Pulse 122  Ht 5\' 6"  (1.676 m)  Wt 119 lb (53.978 kg)  BMI 19.22 kg/m2 , BMI Body mass index is 19.22 kg/(m^2). GEN: Well nourished, well developed, in no acute distress HEENT: normal Neck: no JVD, carotid bruits, or masses Cardiac: tachycardic, regular; no murmurs, rubs, or gallops,no edema  Respiratory:  clear to auscultation bilaterally, normal work of breathing GI: soft, nontender, nondistended, + BS MS: no deformity or atrophy Skin: warm and dry Neuro:  Strength and sensation are intact Psych: euthymic mood, full affect  EKG:  EKG is ordered today. The ekg ordered today shows sinus tachycardia, rate 122, ventricular pre-excitation  Recent Labs: 12/06/2014: TSH 0.730 05/03/2015: BUN 10; Creat 0.66; Hemoglobin 14.5; Platelets 308; Potassium 4.0; Sodium 139    Lipid Panel  No results found  for: CHOL, TRIG, HDL, CHOLHDL, VLDL, LDLCALC, LDLDIRECT   Wt Readings from Last 3 Encounters:  06/19/15 119 lb (53.978 kg)  06/06/15 117 lb (53.071 kg)  05/17/15 116 lb 4.8 oz (52.753 kg)    ASSESSMENT AND PLAN:  1.  Wolf Parkinson White: Ablation done 05/17/15 of WPW with right sided pathway at approximately 8:30 on tricuspid valve.EKG today in clinic shows a return of the pathway. Post ablation, and even the day of discharge, she had no evidence of the pathway. I told her that it would be a good idea to have a repeat ablation. She says that she would like to think about this and Zhanae Proffit call us back.   Current medicines are reviewed at length with the patient today.   The patient does not have concerns regarding her medicines.  The following changes were made today:  none  Labs/ tests ordered today include:  No orders of the defined types were placed in this encounter.     Disposition:   FU with Elton Catalano 3 months  Signed, Eriko Economos Meredith Leeds, MD  06/19/2015 12:34 PM     [place] [address] [place] [address] [phone] (office) [phone] (fax)

## 2015-06-19 ENCOUNTER — Ambulatory Visit (INDEPENDENT_AMBULATORY_CARE_PROVIDER_SITE_OTHER): Payer: BLUE CROSS/BLUE SHIELD | Admitting: Cardiology

## 2015-06-19 ENCOUNTER — Encounter: Payer: Self-pay | Admitting: Cardiology

## 2015-06-19 VITALS — BP 140/98 | HR 122 | Ht 66.0 in | Wt 119.0 lb

## 2015-06-19 DIAGNOSIS — I471 Supraventricular tachycardia: Secondary | ICD-10-CM

## 2015-06-19 NOTE — Patient Instructions (Addendum)
Medication Instructions:  Your physician recommends that you continue on your current medications as directed. Please refer to the Current Medication list given to you today.  Labwork: None ordered  Testing/Procedures: None ordered  Follow-Up: Your physician recommends that you schedule a follow-up appointment in: 3 months with Dr. Curt Bears.   If you need a refill on your cardiac medications before your next appointment, please call your pharmacy.  Thank you for choosing [facility]!!   Trinidad Curet, RN [phone]   Any Other Special Instructions Will Be Listed Below (If Applicable).

## 2015-06-22 ENCOUNTER — Encounter: Payer: Self-pay | Admitting: Internal Medicine

## 2015-06-30 ENCOUNTER — Telehealth: Payer: Self-pay

## 2015-06-30 NOTE — Telephone Encounter (Signed)
Pt wrote Suzanne [name] on my chart about refill on lisdexamfetamine (VYVANSE) 70 MG capsule. Since he wont be back in until the 30th she would like for [facility] to refill this before Tuesday.  Please advise  [phone]

## 2015-07-01 NOTE — Telephone Encounter (Signed)
When reviewing her chart - there was a mychart message about her wanting to change medication - which does she want

## 2015-07-03 NOTE — Telephone Encounter (Signed)
Pt wasn't clear on which one. From what I understood she wanted to up the dose on dextroamphetamine  Instead of the dose she was on before because she felt better with this one.    Please call pt back to verify.  [phone]

## 2015-07-04 ENCOUNTER — Telehealth: Payer: Self-pay | Admitting: Internal Medicine

## 2015-07-05 NOTE — Addendum Note (Signed)
Addended by: Fara Chute on: 07/05/2015 02:23 PM   Modules accepted: Orders, Medications

## 2015-07-06 MED ORDER — DEXTROAMPHETAMINE SULFATE ER 10 MG PO CP24: 20.0000 mg | ORAL_CAPSULE | Freq: Two times a day (BID) | ORAL | Status: DC

## 2015-07-06 NOTE — Addendum Note (Signed)
Addended by: Harrison Mons S on: 07/06/2015 11:00 PM   Modules accepted: Orders

## 2015-07-06 NOTE — Telephone Encounter (Signed)
We need to know SPECIFICALLY what drug she is asking for-lisdexamfetamine? Amphetamine-dextroamphetamine? Dextroamphetamine?  And which dose?  I called her and got a message that the mailbox was full.

## 2015-07-06 NOTE — Telephone Encounter (Signed)
Patient notified via My Chart.  Meds ordered this encounter  Medications  . dextroamphetamine (DEXEDRINE SPANSULE) 10 MG 24 hr capsule    Sig: Take 2 capsules (20 mg total) by mouth 2 (two) times daily.    Dispense:  120 capsule    Refill:  0    Order Specific Question:  Supervising Provider    Answer:  DOOLITTLE, ROBERT P [[id number]]

## 2015-07-09 ENCOUNTER — Other Ambulatory Visit: Payer: Self-pay | Admitting: Internal Medicine

## 2015-07-27 ENCOUNTER — Encounter: Payer: Self-pay | Admitting: *Deleted

## 2015-08-03 ENCOUNTER — Other Ambulatory Visit: Payer: Self-pay | Admitting: Internal Medicine

## 2015-08-03 ENCOUNTER — Telehealth: Payer: Self-pay | Admitting: Physician Assistant

## 2015-08-07 ENCOUNTER — Ambulatory Visit (INDEPENDENT_AMBULATORY_CARE_PROVIDER_SITE_OTHER): Payer: BLUE CROSS/BLUE SHIELD | Admitting: Internal Medicine

## 2015-08-07 ENCOUNTER — Encounter: Payer: Self-pay | Admitting: Internal Medicine

## 2015-08-07 VITALS — BP 132/98 | HR 137 | Ht 66.0 in | Wt 120.8 lb

## 2015-08-07 DIAGNOSIS — R0602 Shortness of breath: Secondary | ICD-10-CM | POA: Insufficient documentation

## 2015-08-07 DIAGNOSIS — I456 Pre-excitation syndrome: Secondary | ICD-10-CM

## 2015-08-07 DIAGNOSIS — F988 Other specified behavioral and emotional disorders with onset usually occurring in childhood and adolescence: Secondary | ICD-10-CM

## 2015-08-07 DIAGNOSIS — R000 Tachycardia, unspecified: Secondary | ICD-10-CM | POA: Insufficient documentation

## 2015-08-07 DIAGNOSIS — I471 Supraventricular tachycardia: Secondary | ICD-10-CM | POA: Diagnosis not present

## 2015-08-07 DIAGNOSIS — Z720 Tobacco use: Secondary | ICD-10-CM | POA: Diagnosis not present

## 2015-08-07 DIAGNOSIS — F17200 Nicotine dependence, unspecified, uncomplicated: Secondary | ICD-10-CM

## 2015-08-07 DIAGNOSIS — F909 Attention-deficit hyperactivity disorder, unspecified type: Secondary | ICD-10-CM

## 2015-08-07 LAB — CBC WITH DIFFERENTIAL/PLATELET
BASOS PCT: 1 %
Basophils Absolute: 82 cells/uL (ref 0–200)
EOS ABS: 82 {cells}/uL (ref 15–500)
Eosinophils Relative: 1 %
HEMATOCRIT: 39.1 % (ref 35.0–45.0)
HEMOGLOBIN: 13.4 g/dL (ref 11.7–15.5)
LYMPHS ABS: 2296 {cells}/uL (ref 850–3900)
LYMPHS PCT: 28 %
MCH: 33.3 pg — ABNORMAL HIGH (ref 27.0–33.0)
MCHC: 34.3 g/dL (ref 32.0–36.0)
MCV: 97.3 fL (ref 80.0–100.0)
MONO ABS: 656 {cells}/uL (ref 200–950)
MPV: 10.2 fL (ref 7.5–12.5)
Monocytes Relative: 8 %
NEUTROS PCT: 62 %
Neutro Abs: 5084 cells/uL (ref 1500–7800)
Platelets: 308 10*3/uL (ref 140–400)
RBC: 4.02 MIL/uL (ref 3.80–5.10)
RDW: 12.8 % (ref 11.0–15.0)
WBC: 8.2 10*3/uL (ref 3.8–10.8)

## 2015-08-07 LAB — D-DIMER, QUANTITATIVE: D-Dimer, Quant: <0.27 ug/mL-FEU (ref 0.00–0.50)

## 2015-08-07 NOTE — Progress Notes (Signed)
Electrophysiology Office Note   Date:  08/07/2015   ID:  Suzanne [name], DOB 1990-11-25, MRN [id number]  PCP:  Leandrew Koyanagi, MD   Primary Electrophysiologist: Dr Curt Bears  Chief Complaint  Patient presents with  . SVT     History of Present Illness: Suzanne [name] is a 25 y.o. female who presents today for electrophysiology evaluation.   She recently underwent R sided AP ablation by Dr Curt Bears for WPW syndrome.  She did well with the procedure but has since had progressive SOB.  She is unaware of any further SVT though her pre-excitation had returned upon recent follow-up with Dr Curt Bears.  She reports noticeable sinus tachycardia ("a low grade increase in heart rate") as well as worsening SOB and leg fatigue.  She reports that over the past few weeks that she has had difficulty getting out of bed due to these symptoms.  Dr Curt Bears is out of town and she thus presents today for additional EP evaluation.  She continues to smoke.  She also takes stimulants for ADHD and is not willing to change these.  Today, she denies symptoms of chest pain,  orthopnea, PND, lower extremity edema, claudication, dizziness, presyncope, syncope, bleeding, or neurologic sequela. The patient is tolerating medications without difficulties and is otherwise without complaint today.    Past Medical History  Diagnosis Date  . 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 Amenorrhea     on DepoProvera  . Scoliosis     idiopathic   . Abnormal Pap smear 09/2008, 2013    LGSIL  at age 37    . WPW (Wolff-Parkinson-White syndrome)   . H/O: depression    Past Surgical History  Procedure Laterality Date  . Colposcopy  01/2010 & 02/2011     both were negative   . Wisdom tooth extraction    . Electrophysiologic study N/A 05/17/2015    SVT ablation by Dr Curt Bears     Current Outpatient Prescriptions  Medication Sig Dispense Refill  . buPROPion (WELLBUTRIN XL) 300 MG 24 hr tablet TAKE 1 TABLET(300  MG) BY MOUTH DAILY. START ON OR AFTER 02/26/15 90 tablet 0  . dextroamphetamine (DEXEDRINE SPANSULE) 10 MG 24 hr capsule Take 2 capsules (20 mg total) by mouth 2 (two) times daily. 120 capsule 0  . FLUoxetine (PROZAC) 40 MG capsule TAKE 1 CAPSULE(40 MG) BY MOUTH DAILY 90 capsule 0  . ibuprofen (ADVIL,MOTRIN) 200 MG tablet Take 600 mg by mouth daily as needed for moderate pain.    . medroxyPROGESTERone (DEPO-PROVERA) 150 MG/ML injection Inject 1 mL (150 mg total) into the muscle every 3 (three) months. 1 mL 4   No current facility-administered medications for this visit.    Allergies:   Review of patient's allergies indicates no known allergies.   Social History:  The patient  reports that she has been smoking Cigarettes.  She has a 1.5 pack-year smoking history. She has never used smokeless tobacco. She reports that she drinks alcohol. She reports that she does not use illicit drugs.   Family History:  The patient's  family history includes ADD / ADHD in her brother and father; Alcoholism in her maternal grandfather, maternal grandmother, maternal uncle, and other; Anxiety disorder in her maternal grandfather and mother; Aplastic anemia in her brother; Arrhythmia in her brother; Breast cancer in her paternal grandmother; Cancer in her paternal grandfather; Diabetes in her paternal grandfather; Drug abuse in her father; Hyperlipidemia in her maternal grandfather; Hypertension in her maternal grandmother;  Other in her brother and brother.    ROS:  Please see the history of present illness.   All other systems are reviewed and negative.    PHYSICAL EXAM: VS:  BP 132/98 mmHg  Pulse 137  Ht 5\' 6"  (1.676 m)  Wt 120 lb 12.8 oz (54.795 kg)  BMI 19.51 kg/m2  SpO2 98% , BMI Body mass index is 19.51 kg/(m^2). GEN: Well nourished, well developed, in no acute distress HEENT: normal Neck: no JVD, carotid bruits, or masses Cardiac: tachy regular rhythm; no murmurs, rubs, or gallops,no edema    Respiratory:  clear to auscultation bilaterally, normal work of breathing GI: soft, nontender, nondistended, + BS MS: no deformity or atrophy Skin: warm and dry  Neuro:  Strength and sensation are intact Psych: anxious mood, somewhat aggitated affect  EKG:  EKG is ordered today. The ekg ordered today shows sinus tachycardia 130 bpm, no obvious pre-excitation today though she does have s1Q3T3 on ekg   Recent Labs: 12/06/2014: TSH 0.730 05/03/2015: BUN 10; Creat 0.66; Hemoglobin 14.5; Platelets 308; Potassium 4.0; Sodium 139    Lipid Panel  No results found for: CHOL, TRIG, HDL, CHOLHDL, VLDL, LDLCALC, LDLDIRECT   Wt Readings from Last 3 Encounters:  08/07/15 120 lb 12.8 oz (54.795 kg)  06/19/15 119 lb (53.978 kg)  06/06/15 117 lb (53.071 kg)      Other studies Reviewed: Additional studies/ records that were reviewed today include: Dr Camnit's procedure note and labs   ASSESSMENT AND PLAN:  1.  Sinus tachycardia with shortness of breath and fatigue post ablation Unclear etiology  Will obtain echo to evaluate for effusion or valvular abnormality D-dimer, Tfts, cbc and bmet to evaluate for other cause.  If dDimer is abnormal, will need chest CTA to evaluate for PTE. Smoking cessation and cessation of ADHD medicines is advised.   Unfortunately, she declines  2. Pre-excitation No pre-excitation on ekg today.  It may be that she has a wounded pathway and at heart rates of 130 bpm the pathway does not conduct May eventually require additional ablation however she has had no further sustained SVT and her current symptoms do not seem consistent with SVT. She will follow-up with Dr Curt Bears regarding this.  Follow-up with Dr Curt Bears depending on findings of above  Labs/ tests ordered today include:  Orders Placed This Encounter  Procedures  . Pregnancy, urine  . CBC with Differential  . Basic metabolic panel  . TSH  . T4  . D-Dimer, Quantitative  . EKG 12-Lead  .  ECHOCARDIOGRAM COMPLETE     Signed, Thompson Grayer, MD  08/07/2015 4:17 PM     [place] [address] [phone] (office) [phone] (fax)

## 2015-08-07 NOTE — Patient Instructions (Signed)
Medication Instructions:  Your physician recommends that you continue on your current medications as directed. Please refer to the Current Medication list given to you today.   Labwork: Your physician recommends that you return for lab work today: BMP/CBC/D-dimer/TSH/T4/HCG   Testing/Procedures: Your physician has requested that you have an echocardiogram. Echocardiography is a painless test that uses sound waves to create images of your heart. It provides your doctor with information about the size and shape of your heart and how well your heart's chambers and valves are working. This procedure takes approximately one hour. There are no restrictions for this procedure.    Follow-Up: Your physician recommends that you schedule a follow-up appointment ---will call with results of test and determine follow up  Any Other Special Instructions Will Be Listed Below (If Applicable).     If you need a refill on your cardiac medications before your next appointment, please call your pharmacy.

## 2015-08-08 ENCOUNTER — Other Ambulatory Visit: Payer: Self-pay

## 2015-08-08 ENCOUNTER — Ambulatory Visit (HOSPITAL_COMMUNITY): Payer: BLUE CROSS/BLUE SHIELD | Attending: Cardiology

## 2015-08-08 DIAGNOSIS — I456 Pre-excitation syndrome: Secondary | ICD-10-CM | POA: Insufficient documentation

## 2015-08-08 DIAGNOSIS — R000 Tachycardia, unspecified: Secondary | ICD-10-CM | POA: Diagnosis present

## 2015-08-08 LAB — ECHOCARDIOGRAM COMPLETE
AOASC: 28 cm
CHL CUP DOP CALC LVOT VTI: 14.3 cm
CHL CUP TV REG PEAK VELOCITY: 240 cm/s
E decel time: 243 msec
EERAT: 5.2
FS: 36 % (ref 28–44)
IVS/LV PW RATIO, ED: 1.11
LA diam end sys: 26 mm
LA vol A4C: 11 ml
LADIAMINDEX: 1.61 cm/m2
LASIZE: 26 mm
LV E/e' medial: 5.2
LV PW d: 8.52 mm — AB (ref 0.6–1.1)
LV e' LATERAL: 18.6 cm/s
LVEEAVG: 5.2
LVOT area: 2.84 cm2
LVOT diameter: 19 mm
LVOT peak grad rest: 3 mmHg
LVOT peak vel: 81.2 cm/s
LVOTSV: 41 mL
MV Dec: 243
MV pk A vel: 93.8 m/s
MVPG: 4 mmHg
MVPKEVEL: 96.7 m/s
TDI e' lateral: 18.6
TDI e' medial: 11.3
TR max vel: 240 cm/s

## 2015-08-08 LAB — TSH: TSH: 1.06 mIU/L

## 2015-08-08 LAB — BASIC METABOLIC PANEL
BUN: 13 mg/dL (ref 7–25)
CHLORIDE: 105 mmol/L (ref 98–110)
CO2: 16 mmol/L — ABNORMAL LOW (ref 20–31)
CREATININE: 0.62 mg/dL (ref 0.50–1.10)
Calcium: 9.4 mg/dL (ref 8.6–10.2)
GLUCOSE: 88 mg/dL (ref 65–99)
POTASSIUM: 4.5 mmol/L (ref 3.5–5.3)
Sodium: 139 mmol/L (ref 135–146)

## 2015-08-08 LAB — PREGNANCY, URINE

## 2015-08-08 LAB — T4: T4 TOTAL: 7.9 ug/dL (ref 4.5–12.0)

## 2015-08-08 LAB — HCG, SERUM, QUALITATIVE: PREG SERUM: NEGATIVE

## 2015-08-09 MED ORDER — DEXTROAMPHETAMINE SULFATE ER 10 MG PO CP24: 20.0000 mg | ORAL_CAPSULE | Freq: Two times a day (BID) | ORAL | Status: DC

## 2015-08-09 NOTE — Addendum Note (Signed)
Addended by: Fara Chute on: 08/09/2015 02:51 PM   Modules accepted: Orders

## 2015-08-09 NOTE — Telephone Encounter (Signed)
Meds ordered this encounter  Medications  . dextroamphetamine (DEXEDRINE SPANSULE) 10 MG 24 hr capsule    Sig: Take 2 capsules (20 mg total) by mouth 2 (two) times daily.    Dispense:  120 capsule    Refill:  0    Order Specific Question:  Supervising Provider    Answer:  DOOLITTLE, ROBERT P [[id number]]

## 2015-08-10 ENCOUNTER — Other Ambulatory Visit: Payer: Self-pay | Admitting: Physician Assistant

## 2015-08-10 NOTE — Telephone Encounter (Signed)
prozac and wellbutrin RFs were sent on 5/31 for 90 days.

## 2015-08-11 ENCOUNTER — Telehealth: Payer: Self-pay | Admitting: Internal Medicine

## 2015-08-11 NOTE — Telephone Encounter (Signed)
Grandmother concerned w/ how patient is doing.  (ok to speak to her per DPR)   States that she is unable to do much and is under tremendous stress at this time.  Patient is currently a Ship broker at [employer] and recently her father left her to take care of his [place] while he left to [place].  Grandmother states that patient is under so much stress and can barely function.  She also says [place] is "going under" because pt cannot function. Reports HRs in office on Monday to be elevated and concerned what Dr. Rayann Heman suggests after testing.  Informed grandmother that all tests came back negative and [facility] advises she follow up w/ [facility].  We also discussed how Allred advised pt stop ADHD medication and smoking.   When I asked grandmother what HRs were she could not tell me any numbers since [facility] office reading.  She stated she was not with patient and did not have the answer to this. When I explained that [facility] recommends f/u w/ [facility] she stated that they thought pt was switching to Allred's care as he takes care of other family members.  I offered to begin process of requesting physician change and grandmother said no -- stated she needed to speak w/ patient first. Advised to call office once decision is made and I would be more that happy to help with this if they choose to transfer care.  She is agreeable and they will call back with decision. Will follow up next week

## 2015-08-11 NOTE — Telephone Encounter (Signed)
New Message  Pt grandmother call requesting to speak with RN. Pt grandmother states pt recented a call about results. Pt gma is concerned and ask for call from RN to talk about Result from last visit with Dr. Rayann Heman on 6/26 please call back to discuss

## 2015-08-14 NOTE — Telephone Encounter (Signed)
Pt p/up. 

## 2015-08-22 ENCOUNTER — Ambulatory Visit (INDEPENDENT_AMBULATORY_CARE_PROVIDER_SITE_OTHER): Payer: BLUE CROSS/BLUE SHIELD

## 2015-08-22 VITALS — BP 114/80 | HR 98 | Resp 16 | Ht 66.0 in | Wt 112.6 lb

## 2015-08-22 DIAGNOSIS — Z3049 Encounter for surveillance of other contraceptives: Secondary | ICD-10-CM | POA: Diagnosis not present

## 2015-08-22 MED ORDER — MEDROXYPROGESTERONE ACETATE 150 MG/ML IM SUSP
150.0000 mg | Freq: Once | INTRAMUSCULAR | Status: AC
  Administered 2015-08-22: 150 mg via INTRAMUSCULAR

## 2015-08-22 NOTE — Progress Notes (Signed)
Patient is here for Depo Provera Injection Patient is within Depo Provera Calender Limits Yes, 7/11 - 7/25 Next Depo Due between: 9/26 - 10/10 Last AEX: 12/06/14 DL AEX Scheduled: Not scheduled yet  Patient is aware when next depo is due  Pt tolerated Injection well. Injection given in L Glute.   Routed to provider for review, encounter closed.

## 2015-09-05 ENCOUNTER — Encounter: Payer: Self-pay | Admitting: Cardiology

## 2015-09-25 ENCOUNTER — Ambulatory Visit: Payer: BLUE CROSS/BLUE SHIELD | Admitting: Cardiology

## 2015-09-25 DIAGNOSIS — R0989 Other specified symptoms and signs involving the circulatory and respiratory systems: Secondary | ICD-10-CM

## 2015-09-25 NOTE — Progress Notes (Deleted)
Electrophysiology Office Note   Date:  09/25/2015   ID:  Suzanne [name], DOB 1990-08-04, MRN [id number]  PCP:  Leandrew Koyanagi, MD Primary Electrophysiologist:  Constance Haw, MD    No chief complaint on file.    History of Present Illness: Suzanne [name] is a 25 y.o. female who presents today for electrophysiology evaluation.   Suzanne [name] has a history of Wolff-Parkinson-White syndrome. Suzanne [name] had ablation done 05/17/15.    Today, Suzanne [name] denies symptoms of palpitations, chest pain, shortness of breath, orthopnea, PND, lower extremity edema, claudication, dizziness, presyncope, syncope, bleeding, or neurologic sequela. The patient is tolerating medications without difficulties and is otherwise without complaint today.    Past Medical History:  Diagnosis Date  . Abnormal Pap smear 09/2008, 2013   LGSIL  at age 52    . 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 Amenorrhea    on DepoProvera  . H/O: depression   . Scoliosis    idiopathic   . WPW (Wolff-Parkinson-White syndrome)    Past Surgical History:  Procedure Laterality Date  . COLPOSCOPY  01/2010 & 02/2011    both were negative   . ELECTROPHYSIOLOGIC STUDY N/A 05/17/2015   SVT ablation by Dr Curt Bears  . WISDOM TOOTH EXTRACTION       Current Outpatient Prescriptions  Medication Sig Dispense Refill  . buPROPion (WELLBUTRIN XL) 300 MG 24 hr tablet TAKE 1 TABLET(300 MG) BY MOUTH DAILY. START ON OR AFTER 02/26/15 90 tablet 0  . dextroamphetamine (DEXEDRINE SPANSULE) 10 MG 24 hr capsule Take 2 capsules (20 mg total) by mouth 2 (two) times daily. 120 capsule 0  . FLUoxetine (PROZAC) 40 MG capsule TAKE 1 CAPSULE(40 MG) BY MOUTH DAILY 90 capsule 0  . ibuprofen (ADVIL,MOTRIN) 200 MG tablet Take 600 mg by mouth daily as needed for moderate pain.    . medroxyPROGESTERone (DEPO-PROVERA) 150 MG/ML injection Inject 1 mL (150 mg total) into the muscle every 3 (three) months. 1 mL 4   No current facility-administered medications  for this visit.     Allergies:   Review of patient's allergies indicates no known allergies.   Social History:  The patient  reports that Suzanne [name] has been smoking Cigarettes.  Suzanne [name] has a 1.50 pack-year smoking history. Suzanne [name] has never used smokeless tobacco. Suzanne [name] reports that Suzanne [name] drinks alcohol. Suzanne [name] reports that Suzanne [name] does not use drugs.   Family History:  The patient's family history includes ADD / ADHD in her brother and father; Alcoholism in her maternal grandfather, maternal grandmother, maternal uncle, and other; Anxiety disorder in her maternal grandfather and mother; Aplastic anemia in her brother; Arrhythmia in her brother; Breast cancer in her paternal grandmother; Cancer in her paternal grandfather; Diabetes in her paternal grandfather; Drug abuse in her father; Hyperlipidemia in her maternal grandfather; Hypertension in her maternal grandmother; Other in her brother and brother.    ROS:  Please see the history of present illness.   Otherwise, review of systems is positive for fatigue, DOE, anxiety, bruising   All other systems are reviewed and negative.    PHYSICAL EXAM: VS:  There were no vitals taken for this visit. , BMI There is no height or weight on file to calculate BMI. GEN: Well nourished, well developed, in no acute distress  HEENT: normal  Neck: no JVD, carotid bruits, or masses Cardiac: tachycardic, regular; no murmurs, rubs, or gallops,no edema  Respiratory:  clear to auscultation bilaterally, normal work of breathing GI: soft, nontender, nondistended, + BS  MS: no deformity or atrophy  Skin: warm and dry Neuro:  Strength and sensation are intact Psych: euthymic mood, full affect  EKG:  EKG is ordered today. The ekg ordered today shows sinus tachycardia, rate 122, ventricular pre-excitation  Recent Labs: 08/07/2015: BUN 13; Creat 0.62; Hemoglobin 13.4; Platelets 308; Potassium 4.5; Sodium 139; TSH 1.06    Lipid Panel  No results found for: CHOL, TRIG, HDL, CHOLHDL, VLDL,  LDLCALC, LDLDIRECT   Wt Readings from Last 3 Encounters:  08/22/15 112 lb 9.6 oz (51.1 kg)  08/07/15 120 lb 12.8 oz (54.8 kg)  06/19/15 119 lb (54 kg)    ASSESSMENT AND PLAN:  1.  Wolf Parkinson White: Ablation done 05/17/15 of WPW with right sided pathway at approximately 8:30 on tricuspid valve.EKG today in clinic shows a return of the pathway. Post ablation, and even the day of discharge, Suzanne [name] had no evidence of the pathway.  Suzanne [name] did not have pre-excitation on her ECG at her last visit with EP, likely a wounded pathway that may not require repeat ablation.    2. Sinus tachycardia: previously advised away from ADHD medications and smoking.  Has denied that intervention.   Current medicines are reviewed at length with the patient today.   The patient does not have concerns regarding her medicines.  The following changes were made today:  none  Labs/ tests ordered today include:  No orders of the defined types were placed in this encounter.    Disposition:   FU with Alohilani Levenhagen 3 months  Signed, Brittley Regner Meredith Leeds, MD  09/25/2015 8:36 AM     [facility] [address] [place] [address] [phone] (office) [phone] (fax)

## 2015-09-27 ENCOUNTER — Encounter: Payer: Self-pay | Admitting: Cardiology

## 2015-10-02 ENCOUNTER — Other Ambulatory Visit: Payer: Self-pay | Admitting: Physician Assistant

## 2015-10-02 NOTE — Telephone Encounter (Signed)
Spoke with pt, she plans to follow up with a provider in in September. Can we refill one more time?

## 2015-10-03 NOTE — Telephone Encounter (Signed)
PATIENT CALLED BACK AGAIN REGARDING THE REFILL ON DEXTROAMPHETAMINE 10 MG 24 HOUR CAPSULE. SHE SAID SHE HAS ALREADY NOT TAKEN IT FOR 22 DAYS. SHE WOULD LIKE TO GET THE PRESCRIPTION WRITTEN TODAY SINCE SARAH IS OFF ON Wednesday. PLEASE CALL HER AS SOON AS POSSIBLE WHEN IT CAN BE PICKED UP. BEST PHONE [phone]  [place]

## 2015-10-04 MED ORDER — DEXTROAMPHETAMINE SULFATE ER 10 MG PO CP24: 20.0000 mg | ORAL_CAPSULE | Freq: Two times a day (BID) | ORAL | 0 refills | Status: DC

## 2015-10-04 NOTE — Telephone Encounter (Signed)
Meds ordered this encounter  Medications  . dextroamphetamine (DEXEDRINE SPANSULE) 10 MG 24 hr capsule    Sig: Take 2 capsules (20 mg total) by mouth 2 (two) times daily.    Dispense:  120 capsule    Refill:  0    Please let Neal know that I'll need to see her for the next fill.

## 2015-10-04 NOTE — Telephone Encounter (Signed)
LMOM for pt advising Rx ready and need for f/up for more, as pt has planned in Sept.

## 2015-10-12 ENCOUNTER — Other Ambulatory Visit: Payer: Self-pay

## 2015-10-12 MED ORDER — BUPROPION HCL ER (XL) 300 MG PO TB24: ORAL_TABLET | ORAL | 0 refills | Status: DC

## 2015-10-12 MED ORDER — FLUOXETINE HCL 40 MG PO CAPS: ORAL_CAPSULE | ORAL | 0 refills | Status: DC

## 2015-11-03 ENCOUNTER — Other Ambulatory Visit: Payer: Self-pay | Admitting: Physician Assistant

## 2015-11-03 ENCOUNTER — Telehealth: Payer: Self-pay

## 2015-11-03 NOTE — Telephone Encounter (Signed)
Patient request a refill of dextroamphetamine (DEXEDRINE SPANSULE) 10 MG 24 hr capsule

## 2015-11-06 NOTE — Telephone Encounter (Signed)
Prescriber not at this practice. Philis Fendt, MS, PA-C 1:24 PM, 11/06/2015

## 2015-11-07 ENCOUNTER — Telehealth: Payer: Self-pay | Admitting: Certified Nurse Midwife

## 2015-11-07 ENCOUNTER — Encounter: Payer: Self-pay | Admitting: Certified Nurse Midwife

## 2015-11-07 ENCOUNTER — Ambulatory Visit: Payer: BLUE CROSS/BLUE SHIELD

## 2015-11-07 NOTE — Progress Notes (Deleted)
Patient is here for Depo Provera Injection Patient is within Depo Provera Calender Limits Yes, 9/26 - 10/10 Next Depo Due between: 12/12 - 12/26 Last AEX: 12/06/14 DL AEX Scheduled: 01/23/16 DL  Patient is aware when next depo is due  Pt tolerated Injection well. Injection given in   Routed to provider for review, encounter closed.

## 2015-11-07 NOTE — Telephone Encounter (Signed)
Patient dnka her depo appointment today. I was unable to leave a message due to her was mailbox full. I mailed a letter asking for her to call and reschedule.

## 2015-11-10 ENCOUNTER — Ambulatory Visit (INDEPENDENT_AMBULATORY_CARE_PROVIDER_SITE_OTHER): Payer: BLUE CROSS/BLUE SHIELD | Admitting: Family Medicine

## 2015-11-10 VITALS — BP 122/82 | HR 110 | Temp 97.4°F | Resp 17 | Ht 66.5 in | Wt 123.0 lb

## 2015-11-10 DIAGNOSIS — Z3042 Encounter for surveillance of injectable contraceptive: Secondary | ICD-10-CM

## 2015-11-10 DIAGNOSIS — F329 Major depressive disorder, single episode, unspecified: Secondary | ICD-10-CM | POA: Diagnosis not present

## 2015-11-10 DIAGNOSIS — F909 Attention-deficit hyperactivity disorder, unspecified type: Secondary | ICD-10-CM | POA: Diagnosis not present

## 2015-11-10 DIAGNOSIS — Z23 Encounter for immunization: Secondary | ICD-10-CM

## 2015-11-10 DIAGNOSIS — F988 Other specified behavioral and emotional disorders with onset usually occurring in childhood and adolescence: Secondary | ICD-10-CM

## 2015-11-10 MED ORDER — DEXTROAMPHETAMINE SULFATE ER 10 MG PO CP24: 20.0000 mg | ORAL_CAPSULE | Freq: Two times a day (BID) | ORAL | 0 refills | Status: DC

## 2015-11-10 MED ORDER — FLUOXETINE HCL 40 MG PO CAPS: 80.0000 mg | ORAL_CAPSULE | Freq: Every day | ORAL | 3 refills | Status: DC

## 2015-11-10 MED ORDER — MEDROXYPROGESTERONE ACETATE 150 MG/ML IM SUSY
150.0000 mg | PREFILLED_SYRINGE | INTRAMUSCULAR | Status: DC
  Administered 2015-11-10: 150 mg via INTRAMUSCULAR

## 2015-11-10 MED ORDER — BUPROPION HCL ER (XL) 300 MG PO TB24: ORAL_TABLET | ORAL | 3 refills | Status: DC

## 2015-11-10 NOTE — Progress Notes (Signed)
Patient ID: Suzanne [name], female    DOB: [birth year], 25 y.o.   MRN: [id number]  PCP: Elizabeth Sauer  Chief Complaint  Patient presents with  . Medication Refill    ALL on list     Subjective:   HPI Presents for medication refills request.  Depression Still experience daily episodes of depression. Manageable but she would like increase dose of fluoxetine 40 mg. She still struggles with episodes of tearfulness which she attributes to troublesome family environment. 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  She attributes some her depression to her cardiac condition "Wolfe Parkinson White syndrome with recurrent fatigue.   Wellbutrin  Medication was originally recommended from psychiatric to help from depression. She has been taking for about 12 months.  She feels it has helped reduce her nicotine dependency. Continues to smoke but reports a large decrease in the number of cigarettes she smokes daily. Denies any unintended weight loss, agitation, nausea, or vomiting.  ADHD  Medication helps with focus while completed her last two semester in college. She doesn't take ADHD medications on the weekends routinely unless she is needing to concentrate on an task. She is expected to graduate Spring of 2018.  Social History   Social History  . Marital status: Single    Spouse name: N/A  . Number of children: N/A  . Years of education: N/A   Occupational History  . Not on file.   Social History Main Topics  . Smoking status: Current Every Day Smoker    Packs/day: 0.50    Years: 3.00    Types: Cigarettes  . Smokeless tobacco: Never Used  . Alcohol use 0.0 oz/week     Comment: 2 glasses of wine a day  . Drug use: No  . Sexual activity: Yes    Birth control/ protection: Injection     Comment: depo   Other Topics Concern  . Not on file   Social History Narrative   Works for a [employer]      Family  History  Problem Relation Age of Onset  . Other Brother     substance abuse  . Hyperlipidemia Maternal Grandfather   . Anxiety disorder Maternal Grandfather   . Alcoholism Maternal Grandfather   . Anxiety disorder Mother   . ADD / ADHD Father   . Drug abuse Father   . ADD / ADHD Brother   . Other Brother     bone marrow transplant  . Breast cancer Paternal Grandmother   . Aplastic anemia Brother   . Hypertension Maternal Grandmother   . Alcoholism Maternal Grandmother   . Diabetes Paternal Grandfather   . Cancer Paternal Grandfather   . Arrhythmia Brother   . Alcoholism Other   . Alcoholism Maternal Uncle    Review of Systems See HPI   Patient Active Problem List   Diagnosis Date Noted  . Sinus tachycardia ([place]) 08/07/2015  . Shortness of breath 08/07/2015  . SVT (supraventricular tachycardia) ([place]) 05/17/2015  . 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 Wolff-Parkinson-White syndrome 05/18/2014  . Smoker 05/18/2014  . ADD (attention deficit disorder) 08/18/2013     Prior to Admission medications   Medication Sig Start Date End Date Taking? Authorizing Provider  buPROPion (WELLBUTRIN XL) 300 MG 24 hr tablet TAKE 1 TABLET(300 MG) BY MOUTH DAILY. 10/12/15  Yes Mancel Bale, PA-C  dextroamphetamine (DEXEDRINE SPANSULE) 10 MG 24 hr capsule Take 2 capsules (20  mg total) by mouth 2 (two) times daily. 10/04/15  Yes Chelle Jeffery, PA-C  FLUoxetine (PROZAC) 40 MG capsule TAKE 1 CAPSULE(40 MG) BY MOUTH DAILY 10/12/15  Yes Mancel Bale, PA-C  ibuprofen (ADVIL,MOTRIN) 200 MG tablet Take 600 mg by mouth daily as needed for moderate pain.   Yes Historical Provider, MD  medroxyPROGESTERone (DEPO-PROVERA) 150 MG/ML injection Inject 1 mL (150 mg total) into the muscle every 3 (three) months. 12/06/14  Yes Regina Eck, CNM     No Known Allergies     Objective:  Physical Exam  Constitutional: She is oriented to person, place, and time. She appears well-developed and well-nourished.    HENT:  Head: Normocephalic and atraumatic.  Right Ear: External ear normal.  Left Ear: External ear normal.  Nose: Nose normal.  Eyes: Conjunctivae are normal. Pupils are equal, round, and reactive to light.  Neck: Normal range of motion.  Cardiovascular: Normal rate, regular rhythm, normal heart sounds and intact distal pulses.   Pulmonary/Chest: Effort normal and breath sounds normal.  Musculoskeletal: Normal range of motion.  Neurological: She is alert and oriented to person, place, and time.  Skin: Skin is warm and dry.  Psychiatric: She has a normal mood and affect. Her behavior is normal. Judgment and thought content normal.   Vitals:   11/10/15 1409  BP: 122/82  Pulse: (!) 129  Resp: 17  Temp: 97.4 F (36.3 C)   Rechecked pulse 110 bpm  Assessment & Plan:  1. ADD (attention deficit disorder), Stable   Plan: dextroamphetamine (DEXEDRINE SPANSULE) 10 MG 24 hr capsule   Sig: Take 2 capsules (20 mg total) by mouth 2 (two) times daily.   2. Reactive depression, intermittent symptoms present, Stable   Plan: Increased Fluoxetine 80 mg (40 mg), take 2 tablets daily. Continue Wellbutrin 300 mg 24 hour tablet once daily   3. Encounter for surveillance of injectable contraceptive  Plan: - MedroxyPROGESTERone Acetate SUSY 150 mg; Inject 1 mL (150 mg total) into the muscle every 3 (three) months.  4. Need for prophylactic vaccination with combined diphtheria-tetanus-pertussis (DTP) vaccine  Plan: TDAP vaccination administered  Carroll Sage. Kenton Kingfisher, MSN, FNP-C [facility]

## 2015-11-10 NOTE — Patient Instructions (Addendum)
Return for follow-up in 3 months.  Nice meeting you!  Carroll Sage. Kenton Kingfisher, MSN, FNP-C [facility]    IF you received an x-ray today, you will receive an invoice from [facility] Radiology. Please contact [facility] at [phone] with questions or concerns regarding your invoice.   IF you received labwork today, you will receive an invoice from [employer]. Please contact [facility] at [phone] with questions or concerns regarding your invoice.   Our billing staff will not be able to assist you with questions regarding bills from these companies.  You will be contacted with the lab results as soon as they are available. The fastest way to get your results is to activate your My Chart account. Instructions are located on the last page of this paperwork. If you have not heard from [place] regarding the results in 2 weeks, please contact this office.    Major Depressive Disorder Major depressive disorder is a mental illness. It also may be called clinical depression or unipolar depression. Major depressive disorder usually causes feelings of sadness, hopelessness, or helplessness. Some people with this disorder do not feel particularly sad but lose interest in doing things they used to enjoy (anhedonia). Major depressive disorder also can cause physical symptoms. It can interfere with work, school, relationships, and other normal everyday activities. The disorder varies in severity but is longer lasting and more serious than the sadness we all feel from time to time in our lives. Major depressive disorder often is triggered by stressful life events or major life changes. Examples of these triggers include divorce, loss of your job or home, a move, and the death of a family member or close friend. Sometimes this disorder occurs for no obvious reason at all. People who have family members with major depressive disorder or  bipolar disorder are at higher risk for developing this disorder, with or without life stressors. Major depressive disorder can occur at any age. It may occur just once in your life (single episode major depressive disorder). It may occur multiple times (recurrent major depressive disorder). SYMPTOMS People with major depressive disorder have either anhedonia or depressed mood on nearly a daily basis for at least 2 weeks or longer. Symptoms of depressed mood include:  Feelings of sadness (blue or down in the dumps) or emptiness.  Feelings of hopelessness or helplessness.  Tearfulness or episodes of crying (may be observed by others).  Irritability (children and adolescents). In addition to depressed mood or anhedonia or both, people with this disorder have at least four of the following symptoms:  Difficulty sleeping or sleeping too much.   Significant change (increase or decrease) in appetite or weight.   Lack of energy or motivation.  Feelings of guilt and worthlessness.   Difficulty concentrating, remembering, or making decisions.  Unusually slow movement (psychomotor retardation) or restlessness (as observed by others).   Recurrent wishes for death, recurrent thoughts of self-harm (suicide), or a suicide attempt. People with major depressive disorder commonly have persistent negative thoughts about themselves, other people, and the world. People with severe major depressive disorder may experiencedistorted beliefs or perceptions about the world (psychotic delusions). They also may see or hear things that are not real (psychotic hallucinations). DIAGNOSIS Major depressive disorder is diagnosed through an assessment by your health care provider. Your health care provider will ask aboutaspects of your daily life, such as mood,sleep, and appetite, to see if you have the diagnostic symptoms of major depressive  disorder. Your health care provider may ask about your medical history  and use of alcohol or drugs, including prescription medicines. Your health care provider also may do a physical exam and blood work. This is because certain medical conditions and the use of certain substances can cause major depressive disorder-like symptoms (secondary depression). Your health care provider also may refer you to a mental health specialist for further evaluation and treatment. TREATMENT It is important to recognize the symptoms of major depressive disorder and seek treatment. The following treatments can be prescribed for this disorder:   Medicine. Antidepressant medicines usually are prescribed. Antidepressant medicines are thought to correct chemical imbalances in the brain that are commonly associated with major depressive disorder. Other types of medicine may be added if the symptoms do not respond to antidepressant medicines alone or if psychotic delusions or hallucinations occur.  Talk therapy. Talk therapy can be helpful in treating major depressive disorder by providing support, education, and guidance. Certain types of talk therapy also can help with negative thinking (cognitive behavioral therapy) and with relationship issues that trigger this disorder (interpersonal therapy). A mental health specialist can help determine which treatment is best for you. Most people with major depressive disorder do well with a combination of medicine and talk therapy. Treatments involving electrical stimulation of the brain can be used in situations with extremely severe symptoms or when medicine and talk therapy do not work over time. These treatments include electroconvulsive therapy, transcranial magnetic stimulation, and vagal nerve stimulation.   This information is not intended to replace advice given to you by your health care provider. Make sure you discuss any questions you have with your health care provider.   Document Released: 05/25/2012 Document Revised: 02/18/2014 Document  Reviewed: 05/25/2012 Elsevier Interactive Patient Education 2016 [employer]. Smoking Cessation, Tips for Success If you are ready to quit smoking, congratulations! You have chosen to help yourself be healthier. Cigarettes bring nicotine, tar, carbon monoxide, and other irritants into your body. Your lungs, heart, and blood vessels will be able to work better without these poisons. There are many different ways to quit smoking. Nicotine gum, nicotine patches, a nicotine inhaler, or nicotine nasal spray can help with physical craving. Hypnosis, support groups, and medicines help break the habit of smoking. WHAT THINGS CAN I DO TO MAKE QUITTING EASIER?  Here are some tips to help you quit for good:  Pick a date when you will quit smoking completely. Tell all of your friends and family about your plan to quit on that date.  Do not try to slowly cut down on the number of cigarettes you are smoking. Pick a quit date and quit smoking completely starting on that day.  Throw away all cigarettes.   Clean and remove all ashtrays from your home, work, and car.  On a card, write down your reasons for quitting. Carry the card with you and read it when you get the urge to smoke.  Cleanse your body of nicotine. Drink enough water and fluids to keep your urine clear or pale yellow. Do this after quitting to flush the nicotine from your body.  Learn to predict your moods. Do not let a bad situation be your excuse to have a cigarette. Some situations in your life might tempt you into wanting a cigarette.  Never have "just one" cigarette. It leads to wanting another and another. Remind yourself of your decision to quit.  Change habits associated with smoking. If you smoked while driving or  when feeling stressed, try other activities to replace smoking. Stand up when drinking your coffee. Brush your teeth after eating. Sit in a different chair when you read the paper. Avoid alcohol while trying to quit, and  try to drink fewer caffeinated beverages. Alcohol and caffeine may urge you to smoke.  Avoid foods and drinks that can trigger a desire to smoke, such as sugary or spicy foods and alcohol.  Ask people who smoke not to smoke around you.  Have something planned to do right after eating or having a cup of coffee. For example, plan to take a walk or exercise.  Try a relaxation exercise to calm you down and decrease your stress. Remember, you may be tense and nervous for the first 2 weeks after you quit, but this will pass.  Find new activities to keep your hands busy. Play with a pen, coin, or rubber band. Doodle or draw things on paper.  Brush your teeth right after eating. This will help cut down on the craving for the taste of tobacco after meals. You can also try mouthwash.   Use oral substitutes in place of cigarettes. Try using lemon drops, carrots, cinnamon sticks, or chewing gum. Keep them handy so they are available when you have the urge to smoke.  When you have the urge to smoke, try deep breathing.  Designate your home as a nonsmoking area.  If you are a heavy smoker, ask your health care provider about a prescription for nicotine chewing gum. It can ease your withdrawal from nicotine.  Reward yourself. Set aside the cigarette money you save and buy yourself something nice.  Look for support from others. Join a support group or smoking cessation program. Ask someone at home or at work to help you with your plan to quit smoking.  Always ask yourself, "Do I need this cigarette or is this just a reflex?" Tell yourself, "Today, I choose not to smoke," or "I do not want to smoke." You are reminding yourself of your decision to quit.  Do not replace cigarette smoking with electronic cigarettes (commonly called e-cigarettes). The safety of e-cigarettes is unknown, and some may contain harmful chemicals.  If you relapse, do not give up! Plan ahead and think about what you will do the  next time you get the urge to smoke. HOW WILL I FEEL WHEN I QUIT SMOKING? You may have symptoms of withdrawal because your body is used to nicotine (the addictive substance in cigarettes). You may crave cigarettes, be irritable, feel very hungry, cough often, get headaches, or have difficulty concentrating. The withdrawal symptoms are only temporary. They are strongest when you first quit but will go away within 10-14 days. When withdrawal symptoms occur, stay in control. Think about your reasons for quitting. Remind yourself that these are signs that your body is healing and getting used to being without cigarettes. Remember that withdrawal symptoms are easier to treat than the major diseases that smoking can cause.  Even after the withdrawal is over, expect periodic urges to smoke. However, these cravings are generally short lived and will go away whether you smoke or not. Do not smoke! WHAT RESOURCES ARE AVAILABLE TO HELP ME QUIT SMOKING? Your health care provider can direct you to community resources or hospitals for support, which may include:  Group support.  Education.  Hypnosis.  Therapy.   This information is not intended to replace advice given to you by your health care provider. Make sure you discuss any  questions you have with your health care provider.   Document Released: 10/27/2003 Document Revised: 02/18/2014 Document Reviewed: 07/16/2012 Elsevier Interactive Patient Education [employer].

## 2015-11-11 ENCOUNTER — Other Ambulatory Visit: Payer: Self-pay

## 2015-11-11 ENCOUNTER — Encounter: Payer: Self-pay | Admitting: Family Medicine

## 2015-11-11 NOTE — Telephone Encounter (Signed)
Pt is stating that she seen Suzanne [name] yesterday and the medication dexedrine quanity should be 120 not 60   Best number [phone]

## 2015-11-13 NOTE — Telephone Encounter (Signed)
Please advise patient these prescriptions will be ready for pick-up tomorrow after 10 am.  Thanks

## 2015-11-13 NOTE — Telephone Encounter (Signed)
Patient reports she had the rx filled for #60 (15 days) and has 2 other written rx for wrong quantity that she will bring back for new rx. With #120 (30 days) when readdy. Please refill.

## 2015-11-14 ENCOUNTER — Other Ambulatory Visit: Payer: Self-pay | Admitting: Family Medicine

## 2015-11-14 DIAGNOSIS — F988 Other specified behavioral and emotional disorders with onset usually occurring in childhood and adolescence: Secondary | ICD-10-CM

## 2015-11-14 MED ORDER — DEXTROAMPHETAMINE SULFATE ER 10 MG PO CP24: 20.0000 mg | ORAL_CAPSULE | Freq: Two times a day (BID) | ORAL | 0 refills | Status: DC

## 2015-11-14 NOTE — Progress Notes (Signed)
Reprinted prescription for 01/11/16-02/10/16.

## 2015-11-14 NOTE — Telephone Encounter (Addendum)
Suzanne [name] re-printed the last Rx and it would not print out. I will re-print again and if another copy comes through later, we will shred the extra. Actually the first re-print, the script was set on "no print" so there should not be an extra copy. Contacted pt to notify all ready. First one to fill 15 days after last Rx since it is only for the second 15 day supply for Oct.. Other two Rxs for #120 each to last for a month each. Pt will return the orig 2nd and 3rd Rxs that were written for only #60 and exchange for corrected ones.

## 2016-01-23 ENCOUNTER — Ambulatory Visit: Payer: BLUE CROSS/BLUE SHIELD | Admitting: Certified Nurse Midwife

## 2016-02-14 ENCOUNTER — Telehealth: Payer: Self-pay

## 2016-02-14 NOTE — Telephone Encounter (Signed)
[facility] or [facility] - Pt needs a refill on her dextroamphetamine.  [phone]

## 2016-02-17 NOTE — Telephone Encounter (Signed)
01/11/16 last rx 10/2015 last ov

## 2016-02-17 NOTE — Telephone Encounter (Signed)
No refill without an OV. She may schedule with any provider.

## 2016-02-19 NOTE — Telephone Encounter (Signed)
L/m needs ov

## 2016-02-19 NOTE — Telephone Encounter (Signed)
Correction, no v/m could not reach patient.

## 2016-02-20 NOTE — Telephone Encounter (Signed)
Tried, no v/m

## 2016-02-21 ENCOUNTER — Ambulatory Visit: Payer: BLUE CROSS/BLUE SHIELD

## 2016-02-22 ENCOUNTER — Ambulatory Visit: Payer: BLUE CROSS/BLUE SHIELD

## 2016-02-22 ENCOUNTER — Ambulatory Visit: Payer: BLUE CROSS/BLUE SHIELD | Admitting: Physician Assistant

## 2016-02-23 ENCOUNTER — Ambulatory Visit (INDEPENDENT_AMBULATORY_CARE_PROVIDER_SITE_OTHER): Payer: BLUE CROSS/BLUE SHIELD | Admitting: Physician Assistant

## 2016-02-23 VITALS — BP 142/82 | HR 119 | Temp 97.4°F | Resp 18 | Ht 66.5 in | Wt 126.0 lb

## 2016-02-23 DIAGNOSIS — F321 Major depressive disorder, single episode, moderate: Secondary | ICD-10-CM

## 2016-02-23 DIAGNOSIS — F988 Other specified behavioral and emotional disorders with onset usually occurring in childhood and adolescence: Secondary | ICD-10-CM

## 2016-02-23 MED ORDER — DEXTROAMPHETAMINE SULFATE ER 10 MG PO CP24: 20.0000 mg | ORAL_CAPSULE | Freq: Two times a day (BID) | ORAL | 0 refills | Status: DC

## 2016-02-23 MED ORDER — ESCITALOPRAM OXALATE 20 MG PO TABS: 20.0000 mg | ORAL_TABLET | Freq: Every day | ORAL | 0 refills | Status: DC

## 2016-02-23 NOTE — Progress Notes (Signed)
Suzanne [name]  MRN: [id number] DOB: [birth year]  Subjective:  Pt presents to clinic for medication adjustment and refill of medications.  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 Jan 2017 was the decrease when she started the wellbutrin ' Behavior -  "Spontaneous stupid shit" last a day or so - monthly - cleaning things that does not matter - buys a lot of stuff that she cannot afford - starts projects (tearing down walls or starting a bathroom redo and then stopping) - more frequent before prozac and wellbutrin  Therapy last year - quit in May - did not feel like it was helping - was using an old family friend - she has contact information for other people in the community - agrees that she was probably not completely honest with the therapist as the relationship with the family  [place] - depression and ADD - doing better - living in [place] with father Older brother - undiagnosed - per patient is pychotic mom bipolar  Review of Systems  Constitutional: Negative for appetite change, chills, fever and unexpected weight change.  Cardiovascular: Negative for chest pain and palpitations.  Psychiatric/Behavioral: Positive for decreased concentration, dysphoric mood, sleep disturbance (sleeps all the time) and suicidal ideas (thoughts but no intent or plan).    Patient Active Problem List   Diagnosis Date Noted  . Sinus tachycardia 08/07/2015  .  Shortness of breath 08/07/2015  . SVT (supraventricular tachycardia) ([place]) 05/17/2015  . Major depression 05/03/2015  . Wolff-Parkinson-White syndrome 05/18/2014  . Smoker 05/18/2014  . ADD (attention deficit disorder) 08/18/2013    Current Outpatient Prescriptions on File Prior to Visit  Medication Sig Dispense Refill  . buPROPion (WELLBUTRIN XL) 300 MG 24 hr tablet TAKE 1 TABLET(300 MG) BY MOUTH DAILY. 30 tablet 3  . ibuprofen (ADVIL,MOTRIN) 200 MG tablet Take 600 mg by mouth daily as needed for moderate pain.    . medroxyPROGESTERone (DEPO-PROVERA) 150 MG/ML injection Inject 1 mL (150 mg total) into the muscle every 3 (three) months. 1 mL 4   Current Facility-Administered Medications on File Prior to Visit  Medication Dose Route Frequency Provider Last Rate Last Dose  . MedroxyPROGESTERone Acetate SUSY 150 mg  150 mg Intramuscular Q90 days Sedalia Muta, FNP   150 mg at 11/10/15 1442    No Known Allergies  Pt patients past, family and social history were reviewed and updated.   Objective:  BP (!) 142/82 (BP Location: Right Arm, Patient Position: Sitting, Cuff Size: Small)   Pulse (!) 119   Temp 97.4 F (36.3 C) (Oral)   Resp 18   Ht 5' 6.5" (1.689 m)   Wt 126 lb (57.2 kg)   SpO2 97%   BMI 20.03 kg/m   Physical Exam  Constitutional: She is oriented to person, place, and time and well-developed, well-nourished, and in no distress.  HENT:  Head:  Normocephalic and atraumatic.  Right Ear: Hearing and external ear normal.  Left Ear: Hearing and external ear normal.  Eyes: Conjunctivae are normal.  Neck: Normal range of motion.  Pulmonary/Chest: Effort normal.  Neurological: She is alert and oriented to person, place, and time. Gait normal.  Skin: Skin is warm and dry.  Psychiatric: Memory and judgment normal. She exhibits a depressed mood. She expresses no suicidal ideation. She expresses no suicidal plans. She has a flat affect.  Vitals  reviewed.   Assessment and Plan :  Attention deficit disorder (ADD) without hyperactivity - Plan: dextroamphetamine (DEXEDRINE SPANSULE) 10 MG 24 hr capsule - refill for now  Moderate single current episode of major depressive disorder ([place]) - Plan: escitalopram (LEXAPRO) 20 MG tablet - we will stop the prozac and switch to lexapro at the patient's request - we discussed that I suspect she could have a mood disorder esp with these episodes monthly of increased energy - she needs therapy and was encouraged to make those phone calls - Pt will recheck with me in a month - I have a low threshold for referral of this patient but she would like to try this 1st =- a mood stabilizer might be helpful for this patient - we discussed that it the suicidal thoughts change or get worse she is to see me or seek other medical attention if I am not available  Windell Hummingbird PA-C  [facility] 02/25/2016 10:29 AM

## 2016-02-23 NOTE — Patient Instructions (Addendum)
Stop the prozac - you will stat the lexapro - please take 1/2 pill for the 1st 6 days and then increase to 1 full pill   Please be mindful of your mood  Please contact a therapist that Dr Laney Pastor gave you the name of as that can be helpful esp as we are changing medications.    IF you received an x-ray today, you will receive an invoice from [facility] Radiology. Please contact [facility] Radiology at [phone] with questions or concerns regarding your invoice.   IF you received labwork today, you will receive an invoice from [place]. Please contact [facility] at [phone] with questions or concerns regarding your invoice.   Our billing staff will not be able to assist you with questions regarding bills from these companies.  You will be contacted with the lab results as soon as they are available. The fastest way to get your results is to activate your My Chart account. Instructions are located on the last page of this paperwork. If you have not heard from [place] regarding the results in 2 weeks, please contact this office.

## 2016-03-12 ENCOUNTER — Telehealth: Payer: Self-pay | Admitting: Certified Nurse Midwife

## 2016-03-12 NOTE — Telephone Encounter (Signed)
Spoke with patient, advised as seen below per Deborah Leonard, CNM. Patient verbalizes understanding and is agreeable.   Routing to provider for final review. Patient is agreeable to disposition. Will close encounter.  

## 2016-03-12 NOTE — Telephone Encounter (Signed)
Patient calling to get started back on the depo. aex scheduled for 03/26/16

## 2016-03-12 NOTE — Telephone Encounter (Signed)
Agree with note, she needs to use consistent condoms and no concerns with pregnancy to restart.

## 2016-03-12 NOTE — Telephone Encounter (Signed)
Spoke with patient. Patient states she would like to restart depo-provera. Patient states last depo July 2017. Patient scheduled for AEX 03/26/16 with Melvia Heaps, CNM. Advised patient can discuss restarting at AEX or may schedule OV prior, patient will discuss at AEX. Patient states LMP "sometime in 2015". Patient verbalizes understanding and is agreeable. Advised patient will review with Melvia Heaps, CNM and return call with any additional recommendations, patient is agreeable.   Melvia Heaps, CNM -ant additional recommendations? Ok to close encounter?

## 2016-03-26 ENCOUNTER — Encounter: Payer: Self-pay | Admitting: Certified Nurse Midwife

## 2016-03-26 ENCOUNTER — Ambulatory Visit (INDEPENDENT_AMBULATORY_CARE_PROVIDER_SITE_OTHER): Payer: BLUE CROSS/BLUE SHIELD | Admitting: Certified Nurse Midwife

## 2016-03-26 VITALS — BP 116/80 | HR 64 | Resp 16 | Ht 65.5 in | Wt 126.0 lb

## 2016-03-26 DIAGNOSIS — Z124 Encounter for screening for malignant neoplasm of cervix: Secondary | ICD-10-CM | POA: Diagnosis not present

## 2016-03-26 DIAGNOSIS — Z0000 Encounter for general adult medical examination without abnormal findings: Secondary | ICD-10-CM | POA: Diagnosis not present

## 2016-03-26 DIAGNOSIS — N912 Amenorrhea, unspecified: Secondary | ICD-10-CM | POA: Diagnosis not present

## 2016-03-26 DIAGNOSIS — Z01419 Encounter for gynecological examination (general) (routine) without abnormal findings: Secondary | ICD-10-CM

## 2016-03-26 LAB — POCT URINALYSIS DIPSTICK
Bilirubin, UA: NEGATIVE
Blood, UA: NEGATIVE
Glucose, UA: NEGATIVE
Ketones, UA: NEGATIVE
LEUKOCYTES UA: NEGATIVE
NITRITE UA: NEGATIVE
PH UA: 5
PROTEIN UA: NEGATIVE
UROBILINOGEN UA: NEGATIVE

## 2016-03-26 LAB — POCT URINE PREGNANCY: PREG TEST UR: NEGATIVE

## 2016-03-26 NOTE — Progress Notes (Signed)
Encounter reviewed Jill Jertson, MD   

## 2016-03-26 NOTE — Progress Notes (Signed)
26 y.o. G0P0 Single  Caucasian Fe here for annual exam. Periods none, with previous Depo Provera. Has been on since 2015. Would like to restart. Also would like to have STD screening, no blood work today, "I hate blood work and do not want done".. Had ablation for SVT 05/17/15 without much change per patient. Not as tired as she was before since ablation. Per epic note ablation not successful. No partner change and no STD concerns. Lives here in [place], has no contact with mother and father lives in [place]. Her younger brother is now living with him.  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 Still drinking alcohol( 2 drinks of wine daily). No drug use. Has noted hump on back of neck, for several years, plans to see her PCP regarding. No pain with just there. Works in [employer]. No other concerns today.  No LMP recorded. Patient is not currently having periods (Reason: Other).  Last Depo Provera 08/22/15, did follow up with Depo in 10/17        Sexually active: Yes.    The current method of family planning is none.    Exercising: No.  exercise Smoker:  yes  Health Maintenance: Pap:  10-29-13 neg hx of LGSIL,CIN1, 12-06-14 neg MMG:  none Colonoscopy:  none BMD:   none TDaP:  2017 Shingles: no Pneumonia: no Hep C and HIV: both neg 2016 Labs: poct urine-neg, upt-neg Self breast exam: not done   reports that she has been smoking Cigarettes.  She has been smoking about 0.00 packs per day for the past 3.00 years. She has never used smokeless tobacco. She reports that she drinks alcohol. She reports that she does not use drugs.  Past Medical History:  Diagnosis Date  . Abnormal Pap smear 09/2008, 2013   LGSIL  at age 74    . 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 Amenorrhea    on DepoProvera  . H/O: depression   . Scoliosis    idiopathic   . WPW (Wolff-Parkinson-White syndrome)     Past Surgical History:  Procedure Laterality  Date  . COLPOSCOPY  01/2010 & 02/2011    both were negative   . ELECTROPHYSIOLOGIC STUDY N/A 05/17/2015   SVT ablation by Dr Curt Bears  . WISDOM TOOTH EXTRACTION      Current Outpatient Prescriptions  Medication Sig Dispense Refill  . amphetamine-dextroamphetamine (ADDERALL) 10 MG tablet Take by mouth daily with breakfast. Takes 40mg     . escitalopram (LEXAPRO) 20 MG tablet Take 1 tablet (20 mg total) by mouth daily. 30 tablet 0  . ibuprofen (ADVIL,MOTRIN) 200 MG tablet Take 600 mg by mouth daily as needed for moderate pain.    Marland Kitchen dextroamphetamine (DEXEDRINE SPANSULE) 10 MG 24 hr capsule Take 2 capsules (20 mg total) by mouth 2 (two) times daily. 120 capsule 0   No current facility-administered medications for this visit.     Family History  Problem Relation Age of Onset  . Other Brother     substance abuse  . Hyperlipidemia Maternal Grandfather   . Anxiety disorder Maternal Grandfather   . Alcoholism Maternal Grandfather   . Anxiety disorder Mother   . ADD / ADHD Father   . Drug abuse Father   . ADD / ADHD Brother   . Other Brother     bone marrow transplant  . Breast cancer Paternal Grandmother   . Aplastic anemia Brother   . Hypertension Maternal Grandmother   . Alcoholism  Maternal Grandmother   . Diabetes Paternal Grandfather   . Cancer Paternal Grandfather   . Arrhythmia Brother   . Alcoholism Other   . Alcoholism Maternal Uncle     ROS:  Pertinent items are noted in HPI.  Otherwise, a comprehensive ROS was negative.  Exam:   BP 116/80   Pulse 64   Resp 16   Ht 5' 5.5" (1.664 m)   Wt 126 lb (57.2 kg)   BMI 20.65 kg/m  Height: 5' 5.5" (166.4 cm) Ht Readings from Last 3 Encounters:  03/26/16 5' 5.5" (1.664 m)  02/23/16 5' 6.5" (1.689 m)  11/10/15 5' 6.5" (1.689 m)    General appearance: alert, cooperative and appears stated age Head: Normocephalic, without obvious abnormality, atraumatic Neck: no adenopathy, supple, symmetrical, trachea midline and thyroid  normal to inspection and palpation. ? Lipoma palpated at base of neck posterior, soft, non tender. No skin change noted. Lungs: clear to auscultation bilaterally Breasts: normal appearance, no masses or tenderness, No nipple retraction or dimpling, No nipple discharge or bleeding, No axillary or supraclavicular adenopathy Heart: regular rate and rhythm Abdomen: soft, non-tender; no masses,  no organomegaly Extremities: extremities normal, atraumatic, no cyanosis or edema Skin: Skin color, texture, turgor normal. No rashes or lesions Lymph nodes: Cervical, supraclavicular, and axillary nodes normal. No abnormal inguinal nodes palpated Neurologic: Grossly normal   Pelvic: External genitalia:  no lesions              Urethra:  normal appearing urethra with no masses, tenderness or lesions              Bartholin's and Skene's: normal                 Vagina: normal appearing vagina with normal color and discharge, no lesions              Cervix: no bleeding following Pap, no cervical motion tenderness, no lesions and nulliparous appearance              Pap taken: Yes.   Bimanual Exam:  Uterus:  normal size, contour, position, consistency, mobility, non-tender              Adnexa: normal adnexa and no mass, fullness, tenderness               Rectovaginal: Confirms               Anus:  normal appearance  Chaperone present: yes  A:  Well Woman with normal exam  Contraception none since last Depo in 711/17, desires again  Amenorrhea with negative UPT here today  STD screening, no labs  History of alcohol abuse, smoker  ?Lipoma  ADD and depression management with PCP  History of Wolf Parkinson White syndrome with unsuccessful ablation    P:   Reviewed health and wellness pertinent to exam Discussed evaluation for amenorrhea since no period even not on Depo Provera. Can not restart Depo until amenorrhea evaluated. Would need to use consistent contraception to be able to restart and on  period. Patient declines any labs. Discussed amenorrhea can be from thyroid, pituitary or weight change also and concerns.Questions addressed.  Labs: GC,Chlamydia, Affirm  Discussed alcohol noted on her breath, and she acknowledged she had 2 glasses of wine prior to visit. Discussed concern with use and her other medications. Encouraged to abstain. Aware of AA and counseling availability, not interested.  Discussed ? Lipoma at base of neck posterior. Needs evaluation. Plans to  see PCP.  Continue to follow up with PCP and cardiology.  Pap smear as above   counseled on breast self exam, STD prevention, HIV risk factors and prevention, adequate intake of calcium and vitamin D, diet and exercise  return annually or prn  An After Visit Summary was printed and given to the patient.

## 2016-03-26 NOTE — Patient Instructions (Signed)
General topics  Next pap or exam is  due in 1 year Take a Women's multivitamin Take 1200 mg. of calcium daily - prefer dietary If any concerns in interim to call back  Breast Self-Awareness Practicing breast self-awareness may pick up problems early, prevent significant medical complications, and possibly save your life. By practicing breast self-awareness, you can become familiar with how your breasts look and feel and if your breasts are changing. This allows you to notice changes early. It can also offer you some reassurance that your breast health is good. One way to learn what is normal for your breasts and whether your breasts are changing is to do a breast self-exam. If you find a lump or something that was not present in the past, it is best to contact your caregiver right away. Other findings that should be evaluated by your caregiver include nipple discharge, especially if it is bloody; skin changes or reddening; areas where the skin seems to be pulled in (retracted); or new lumps and bumps. Breast pain is seldom associated with cancer (malignancy), but should also be evaluated by a caregiver. BREAST SELF-EXAM The best time to examine your breasts is 5 7 days after your menstrual period is over.  [employer] Patient Information 2013 [place].   Exercise to Stay Healthy Exercise helps you become and stay healthy. EXERCISE IDEAS AND TIPS Choose exercises that:  You enjoy.  Fit into your day. You do not need to exercise really hard to be healthy. You can do exercises at a slow or medium level and stay healthy. You can:  Stretch before and after working out.  Try yoga, Pilates, or tai chi.  Lift weights.  Walk fast, swim, jog, run, climb stairs, bicycle, dance, or rollerskate.  Take aerobic classes. Exercises that burn about 150 calories:  Running 1  miles in 15 minutes.  Playing volleyball for 45 to 60 minutes.  Washing and waxing a car for 45 to 60  minutes.  Playing touch football for 45 minutes.  Walking 1  miles in 35 minutes.  Pushing a stroller 1  miles in 30 minutes.  Playing basketball for 30 minutes.  Raking leaves for 30 minutes.  Bicycling 5 miles in 30 minutes.  Walking 2 miles in 30 minutes.  Dancing for 30 minutes.  Shoveling snow for 15 minutes.  Swimming laps for 20 minutes.  Walking up stairs for 15 minutes.  Bicycling 4 miles in 15 minutes.  Gardening for 30 to 45 minutes.  Jumping rope for 15 minutes.  Washing windows or floors for 45 to 60 minutes. Document Released: 03/02/2010 Document Revised: 04/22/2011 Document Reviewed: 03/02/2010 [facility] Patient Information 2013 [place].   Other topics ( that may be useful information):    Sexually Transmitted Disease Sexually transmitted disease (STD) refers to any infection that is passed from person to person during sexual activity. This may happen by way of saliva, semen, blood, vaginal mucus, or urine. Common STDs include:  Gonorrhea.  Chlamydia.  Syphilis.  HIV/AIDS.  Genital herpes.  Hepatitis B and C.  Trichomonas.  Human papillomavirus (HPV).  Pubic lice. CAUSES  An STD may be spread by bacteria, virus, or parasite. A person can get an STD by:  Sexual intercourse with an infected person.  Sharing sex toys with an infected person.  Sharing needles with an infected person.  Having intimate contact with the genitals, mouth, or rectal areas of an infected person. SYMPTOMS  Some people may not have any symptoms, but  they can still pass the infection to others. Different STDs have different symptoms. Symptoms include:  Painful or bloody urination.  Pain in the pelvis, abdomen, vagina, anus, throat, or eyes.  Skin rash, itching, irritation, growths, or sores (lesions). These usually occur in the genital or anal area.  Abnormal vaginal discharge.  Penile discharge in men.  Soft, flesh-colored skin growths in the  genital or anal area.  Fever.  Pain or bleeding during sexual intercourse.  Swollen glands in the groin area.  Yellow skin and eyes (jaundice). This is seen with hepatitis. DIAGNOSIS  To make a diagnosis, your caregiver may:  Take a medical history.  Perform a physical exam.  Take a specimen (culture) to be examined.  Examine a sample of discharge under a microscope.  Perform blood test TREATMENT   Chlamydia, gonorrhea, trichomonas, and syphilis can be cured with antibiotic medicine.  Genital herpes, hepatitis, and HIV can be treated, but not cured, with prescribed medicines. The medicines will lessen the symptoms.  Genital warts from HPV can be treated with medicine or by freezing, burning (electrocautery), or surgery. Warts may come back.  HPV is a virus and cannot be cured with medicine or surgery.However, abnormal areas may be followed very closely by your caregiver and may be removed from the cervix, vagina, or vulva through office procedures or surgery. If your diagnosis is confirmed, your recent sexual partners need treatment. This is true even if they are symptom-free or have a negative culture or evaluation. They should not have sex until their caregiver says it is okay. HOME CARE INSTRUCTIONS  All sexual partners should be informed, tested, and treated for all STDs.  Take your antibiotics as directed. Finish them even if you start to feel better.  Only take over-the-counter or prescription medicines for pain, discomfort, or fever as directed by your caregiver.  Rest.  Eat a balanced diet and drink enough fluids to keep your urine clear or pale yellow.  Do not have sex until treatment is completed and you have followed up with your caregiver. STDs should be checked after treatment.  Keep all follow-up appointments, Pap tests, and blood tests as directed by your caregiver.  Only use latex condoms and water-soluble lubricants during sexual activity. Do not use  petroleum jelly or oils.  Avoid alcohol and illegal drugs.  Get vaccinated for HPV and hepatitis. If you have not received these vaccines in the past, talk to your caregiver about whether one or both might be right for you.  Avoid risky sex practices that can break the skin. The only way to avoid getting an STD is to avoid all sexual activity.Latex condoms and dental dams (for oral sex) will help lessen the risk of getting an STD, but will not completely eliminate the risk. SEEK MEDICAL CARE IF:   You have a fever.  You have any new or worsening symptoms. Document Released: 04/20/2002 Document Revised: 04/22/2011 Document Reviewed: 04/27/2010 [facility] Patient Information 2013 [place].    Domestic Abuse You are being battered or abused if someone close to you hits, pushes, or physically hurts you in any way. You also are being abused if you are forced into activities. You are being sexually abused if you are forced to have sexual contact of any kind. You are being emotionally abused if you are made to feel worthless or if you are constantly threatened. It is important to remember that help is available. No one has the right to abuse you. PREVENTION OF FURTHER  ABUSE  Learn the warning signs of danger. This varies with situations but may include: the use of alcohol, threats, isolation from friends and family, or forced sexual contact. Leave if you feel that violence is going to occur.  If you are attacked or beaten, report it to the police so the abuse is documented. You do not have to press charges. The police can protect you while you or the attackers are leaving. Get the officer's name and badge number and a copy of the report.  Find someone you can trust and tell them what is happening to you: your caregiver, a nurse, clergy member, close friend or family member. Feeling ashamed is natural, but remember that you have done nothing wrong. No one deserves abuse. Document Released:  01/26/2000 Document Revised: 04/22/2011 Document Reviewed: 04/05/2010 [facility] Patient Information 2013 [place].    How Much is Too Much Alcohol? Drinking too much alcohol can cause injury, accidents, and health problems. These types of problems can include:   Car crashes.  Falls.  Family fighting (domestic violence).  Drowning.  Fights.  Injuries.  Burns.  Damage to certain organs.  Having a baby with birth defects. ONE DRINK CAN BE TOO MUCH WHEN YOU ARE:  Working.  Pregnant or breastfeeding.  Taking medicines. Ask your doctor.  Driving or planning to drive. If you or someone you know has a drinking problem, get help from a doctor.  Document Released: 11/24/2008 Document Revised: 04/22/2011 Document Reviewed: 11/24/2008 [facility] Patient Information 2013 [place].   Smoking Hazards Smoking cigarettes is extremely bad for your health. Tobacco smoke has over 200 known poisons in it. There are over 60 chemicals in tobacco smoke that cause cancer. Some of the chemicals found in cigarette smoke include:   Cyanide.  Benzene.  Formaldehyde.  Methanol (wood alcohol).  Acetylene (fuel used in welding torches).  Ammonia. Cigarette smoke also contains the poisonous gases nitrogen oxide and carbon monoxide.  Cigarette smokers have an increased risk of many serious medical problems and Smoking causes approximately:  90% of all lung cancer deaths in men.  80% of all lung cancer deaths in women.  90% of deaths from chronic obstructive lung disease. Compared with nonsmokers, smoking increases the risk of:  Coronary heart disease by 2 to 4 times.  Stroke by 2 to 4 times.  Men developing lung cancer by 23 times.  Women developing lung cancer by 13 times.  Dying from chronic obstructive lung diseases by 12 times.  . Smoking is the most preventable cause of death and disease in our society.  WHY IS SMOKING ADDICTIVE?  Nicotine is the chemical  agent in tobacco that is capable of causing addiction or dependence.  When you smoke and inhale, nicotine is absorbed rapidly into the bloodstream through your lungs. Nicotine absorbed through the lungs is capable of creating a powerful addiction. Both inhaled and non-inhaled nicotine may be addictive.  Addiction studies of cigarettes and spit tobacco show that addiction to nicotine occurs mainly during the teen years, when young people begin using tobacco products. WHAT ARE THE BENEFITS OF QUITTING?  There are many health benefits to quitting smoking.   Likelihood of developing cancer and heart disease decreases. Health improvements are seen almost immediately.  Blood pressure, pulse rate, and breathing patterns start returning to normal soon after quitting. QUITTING SMOKING   American Lung Association - 1-800-LUNGUSA  American Cancer Society - 1-800-ACS-2345 Document Released: 03/07/2004 Document Revised: 04/22/2011 Document Reviewed: 11/09/2008 [facility] Patient Information 2013 [place].   Stress Management Stress is a state of physical or mental tension that often results from changes in your life or normal routine. Some common causes of stress are:  Death of a loved one.  Injuries or severe illnesses.  Getting fired or changing jobs.  Moving into a new home. Other causes may be:  Sexual problems.  Business or financial losses.  Taking on a large debt.  Regular conflict with someone at home or at work.  Constant tiredness from lack of sleep. It is not just bad things that are stressful. It may be stressful to:  Win the lottery.  Get married.  Buy a new car. The amount of stress that can be easily tolerated varies from person to person. Changes generally cause stress, regardless of the types of change. Too much stress can affect your health. It may lead to physical or emotional problems. Too little stress (boredom) may also become stressful. SUGGESTIONS TO  REDUCE STRESS:  Talk things over with your family and friends. It often is helpful to share your concerns and worries. If you feel your problem is serious, you may want to get help from a professional counselor.  Consider your problems one at a time instead of lumping them all together. Trying to take care of everything at once may seem impossible. List all the things you need to do and then start with the most important one. Set a goal to accomplish 2 or 3 things each day. If you expect to do too many in a single day you will naturally fail, causing you to feel even more stressed.  Do not use alcohol or drugs to relieve stress. Although you may feel better for a short time, they do not remove the problems that caused the stress. They can also be habit forming.  Exercise regularly - at least 3 times per week. Physical exercise can help to relieve that "uptight" feeling and will relax you.  The shortest distance between despair and hope is often a good night's sleep.  Go to bed and get up on time allowing yourself time for appointments without being rushed.  Take a short "time-out" period from any stressful situation that occurs during the day. Close your eyes and take some deep breaths. Starting with the muscles in your face, tense them, hold it for a few seconds, then relax. Repeat this with the muscles in your neck, shoulders, hand, stomach, back and legs.  Take good care of yourself. Eat a balanced diet and get plenty of rest.  Schedule time for having fun. Take a break from your daily routine to relax. HOME CARE INSTRUCTIONS   Call if you feel overwhelmed by your problems and feel you can no longer manage them on your own.  Return immediately if you feel like hurting yourself or someone else. Document Released: 07/24/2000 Document Revised: 04/22/2011 Document Reviewed: 03/16/2007 [facility] Patient Information 2013 [place].   Alcohol Use Disorder Alcohol use disorder is when  your drinking disrupts your daily life. When you have this condition, you drink too much alcohol and you cannot control your drinking. Alcohol use disorder can cause serious problems with your physical health. It can affect your brain, heart, liver, pancreas, immune system, stomach, and intestines. Alcohol use disorder can increase your risk for certain cancers and cause problems with your mental health, such as depression, anxiety, psychosis, delirium, and dementia. People with this disorder risk hurting themselves and others. What are the causes? This condition is caused  by drinking too much alcohol over time. It is not caused by drinking too much alcohol only one or two times. Some people with this condition drink alcohol to cope with or escape from negative life events. Others drink to relieve pain or symptoms of mental illness. What increases the risk? You are more likely to develop this condition if:  You have a family history of alcohol use disorder.  Your culture encourages drinking to the point of intoxication, or makes alcohol easy to get.  You had a mood or conduct disorder in childhood.  You have been a victim of abuse.  You are an adolescent and:  You have poor grades or difficulties in school.  Your caregivers do not talk to you about saying no to alcohol, or supervise your activities.  You are impulsive or you have trouble with self-control. What are the signs or symptoms? Symptoms of this condition include:  Drinkingmore than you want to.  Drinking for longer than you want to.  Trying several times to drink less or to control your drinking.  Spending a lot of time getting alcohol, drinking, or recovering from drinking.  Craving alcohol.  Having problems at work, at school, or at home due to drinking.  Having problems in relationships due to drinking.  Drinking when it is dangerous to drink, such as before driving a car.  Continuing to drink even though you  know you might have a physical or mental problem related to drinking.  Needing more and more alcohol to get the same effect you want from the alcohol (building up tolerance).  Having symptoms of withdrawal when you stop drinking. Symptoms of withdrawal include:  Fatigue.  Nightmares.  Trouble sleeping.  Depression.  Anxiety.  Fever.  Seizures.  Severe confusion.  Feeling or seeing things that are not there (hallucinations).  Tremors.  Rapid heart rate.  Rapid breathing.  High blood pressure.  Drinking to avoid symptoms of withdrawal. How is this diagnosed? This condition is diagnosed with an assessment. Your health care provider may start the assessment by asking three or four questions about your drinking. Your health care provider may perform a physical exam or do lab tests to see if you have physical problems resulting from alcohol use. She or he may refer you to a mental health professional for evaluation. How is this treated? Some people with alcohol use disorder are able to reduce their alcohol use to low-risk levels. Others need to completely quit drinking alcohol. When necessary, mental health professionals with specialized training in substance use treatment can help. Your health care provider can help you decide how severe your alcohol use disorder is and what type of treatment you need. The following forms of treatment are available:  Detoxification. Detoxification involves quitting drinking and using prescription medicines within the first week to help lessen withdrawal symptoms. This treatment is important for people who have had withdrawal symptoms before and for heavy drinkers who are likely to have withdrawal symptoms. Alcohol withdrawal can be dangerous, and in severe cases, it can cause death. Detoxification may be provided in a home, community, or primary care setting, or in a hospital or substance use treatment facility.  Counseling. This treatment is also  called talk therapy. It is provided by substance use treatment counselors. A counselor can address the reasons you use alcohol and suggest ways to keep you from drinking again or to prevent problem drinking. The goals of talk therapy are to:  Find healthy activities and ways for  you to cope with stress.  Identify and avoid the things that trigger your alcohol use.  Help you learn how to handle cravings.  Medicines.Medicines can help treat alcohol use disorder by:  Decreasing alcohol cravings.  Decreasing the positive feeling you have when you drink alcohol.  Causing an uncomfortable physical reaction when you drink alcohol (aversion therapy).  Support groups. Support groups are led by people who have quit drinking. They provide emotional support, advice, and guidance. These forms of treatment are often combined. Some people with this condition benefit from a combination of treatments provided by specialized substance use treatment centers. Follow these instructions at home:  Take over-the-counter and prescription medicines only as told by your health care provider.  Check with your health care provider before starting any new medicines.  Ask friends and family members not to offer you alcohol.  Avoid situations where alcohol is served, including gatherings where others are drinking alcohol.  Create a plan for what to do when you are tempted to use alcohol.  Find hobbies or activities that you enjoy that do not include alcohol.  Keep all follow-up visits as told by your health care provider. This is important. How is this prevented?  If you drink, limit alcohol intake to no more than 1 drink a day for nonpregnant women and 2 drinks a day for men. One drink equals 12 oz of beer, 5 oz of wine, or 1 oz of hard liquor.  If you have a mental health condition, get treatment and support.  Do not give alcohol to adolescents.  If you are an adolescent:  Do not drink alcohol.  Do  not be afraid to say no if someone offers you alcohol. Speak up about why you do not want to drink. You can be a positive role model for your friends and set a good example for those around you by not drinking alcohol.  If your friends drink, spend time with others who do not drink alcohol. Make new friends who do not use alcohol.  Find healthy ways to manage stress and emotions, such as meditation or deep breathing, exercise, spending time in nature, listening to music, or talking with a trusted friend or family member. Contact a health care provider if:  You are not able to take your medicines as told.  Your symptoms get worse.  You return to drinking alcohol (relapse) and your symptoms get worse. Get help right away if:  You have thoughts about hurting yourself or others. If you ever feel like you may hurt yourself or others, or have thoughts about taking your own life, get help right away. You can go to your nearest emergency department or call:  Your local emergency services (911 in the [place]  A suicide crisis helpline, such as the [place] at 4408439883. This is open 24 hours a day. Summary  Alcohol use disorder is when your drinking disrupts your daily life. When you have this condition, you drink too much alcohol and you cannot control your drinking.  Treatment may include detoxification, counseling, medicine, and support groups.  Ask friends and family members not to offer you alcohol. Avoid situations where alcohol is served.  Get help right away if you have thoughts about hurting yourself or others. This information is not intended to replace advice given to you by your health care provider. Make sure you discuss any questions you have with your health care provider. Document Released: 03/07/2004 Document Revised: 10/26/2015 Document Reviewed: 10/26/2015  [employer]vier Interactive Patient Education  2017 [employer]  

## 2016-03-27 ENCOUNTER — Other Ambulatory Visit: Payer: Self-pay

## 2016-03-27 ENCOUNTER — Telehealth: Payer: Self-pay

## 2016-03-27 DIAGNOSIS — F988 Other specified behavioral and emotional disorders with onset usually occurring in childhood and adolescence: Secondary | ICD-10-CM

## 2016-03-27 LAB — WET PREP BY MOLECULAR PROBE
Candida species: NEGATIVE
GARDNERELLA VAGINALIS: POSITIVE — AB
Trichomonas vaginosis: NEGATIVE

## 2016-03-27 MED ORDER — METRONIDAZOLE 0.75 % VA GEL
1.0000 | Freq: Two times a day (BID) | VAGINAL | 0 refills | Status: AC
Start: 2016-03-27 — End: 2016-04-01

## 2016-03-27 NOTE — Telephone Encounter (Signed)
-----   Message from Regina Eck, CNM sent at 03/27/2016  2:59 PM EST ----- Notify patient that wet prep was negative for yeast and trichomonas. Positive for BV. Rx Metrogel bid x 5 vaginally Other labs pending

## 2016-03-27 NOTE — Telephone Encounter (Signed)
Patient advised of lab results. Patient verbalized understanding and agreement. Prescription sent to pharmacy on file.

## 2016-03-27 NOTE — Telephone Encounter (Signed)
02/23/16 last ov and refill 

## 2016-03-27 NOTE — Telephone Encounter (Signed)
PATIENT WOULD LIKE SARAH TO KNOW THAT SHE NEEDS A REFILL ON HER DEXTROAMPHETAMINE 10 MG 24 HOUR CAPSULE (DOSE IS 20 MG 2 TIMES A DAY) SHE IS COMPLETELY OUT. (I EXPLAINED THE REFILL POLICY). BEST PHONE [phone] (CELL) PLEASE CALL HER WHEN SHE CAN PICK THE PRESCRIPTION UP.  [place]

## 2016-03-28 LAB — IPS PAP TEST WITH REFLEX TO HPV

## 2016-03-28 LAB — IPS N GONORRHOEA AND CHLAMYDIA BY PCR

## 2016-03-28 NOTE — Telephone Encounter (Signed)
She will make a sooner appointment, she states because she needs them now.  I advised that she will discuss the med refill at that time.

## 2016-03-28 NOTE — Telephone Encounter (Signed)
At her last visit we started her on a new SSRI and she is supposed to f/u with me in the clinic for that.  I will refill her medication at that visit.

## 2016-03-29 ENCOUNTER — Ambulatory Visit (INDEPENDENT_AMBULATORY_CARE_PROVIDER_SITE_OTHER): Payer: BLUE CROSS/BLUE SHIELD | Admitting: Physician Assistant

## 2016-03-29 VITALS — BP 136/86 | Temp 97.8°F | Ht 65.5 in | Wt 127.6 lb

## 2016-03-29 DIAGNOSIS — R454 Irritability and anger: Secondary | ICD-10-CM

## 2016-03-29 DIAGNOSIS — F988 Other specified behavioral and emotional disorders with onset usually occurring in childhood and adolescence: Secondary | ICD-10-CM | POA: Diagnosis not present

## 2016-03-29 DIAGNOSIS — D170 Benign lipomatous neoplasm of skin and subcutaneous tissue of head, face and neck: Secondary | ICD-10-CM

## 2016-03-29 DIAGNOSIS — F321 Major depressive disorder, single episode, moderate: Secondary | ICD-10-CM | POA: Diagnosis not present

## 2016-03-29 MED ORDER — GABAPENTIN 100 MG PO CAPS: 100.0000 mg | ORAL_CAPSULE | Freq: Every day | ORAL | 0 refills | Status: DC

## 2016-03-29 MED ORDER — ESCITALOPRAM OXALATE 20 MG PO TABS
30.0000 mg | ORAL_TABLET | Freq: Every day | ORAL | 0 refills | Status: DC
Start: 2016-03-29 — End: 2016-04-25

## 2016-03-29 MED ORDER — DEXTROAMPHETAMINE SULFATE ER 10 MG PO CP24: 20.0000 mg | ORAL_CAPSULE | Freq: Two times a day (BID) | ORAL | 0 refills | Status: DC

## 2016-03-29 MED ORDER — ESCITALOPRAM OXALATE 20 MG PO TABS: 30.0000 mg | ORAL_TABLET | Freq: Every day | ORAL | 0 refills | Status: DC

## 2016-03-29 NOTE — Progress Notes (Signed)
Suzanne [name]  MRN: [id number] DOB: 02-Jan-1991  Subjective:  Pt presents to clinic for medication recheck.  She has started the lexapro and feels like her sadness and depressed mood is better.  She still has no energy and motivation but she does not think it has gotten worse since the stop of her Wellbutrin.  She wants to know if she can increase her ADD medications - they are lasting long enough but they are not strong enough.  In the past she noticed that her Dexedrine would give her energy and motivation in the am and that she could focus on a task and get that done but now she cannot seem to focus on anything.  She mind is going in many directions at once.  She feels like there is so much going on in her head at once she can not focus on anything.  When this happens she gets down and then she gets irritable and does not want to be around anyone.  She also has a swelling on the back of her neck.  It does not hurt or cause her problems other than it is big and ugly and she does not like its appearance.  Review of Systems  Psychiatric/Behavioral: Positive for decreased concentration and dysphoric mood. Negative for sleep disturbance. The patient is nervous/anxious.     Patient Active Problem List   Diagnosis Date Noted  . Irritability 03/29/2016  . Sinus tachycardia 08/07/2015  . Shortness of breath 08/07/2015  . SVT (supraventricular tachycardia) ([place]) 05/17/2015  . Major depression 05/03/2015  . Wolff-Parkinson-White syndrome 05/18/2014  . Smoker 05/18/2014  . ADD (attention deficit disorder) 08/18/2013    Current Outpatient Prescriptions on File Prior to Visit  Medication Sig Dispense Refill  . ibuprofen (ADVIL,MOTRIN) 200 MG tablet Take 600 mg by mouth daily as needed for moderate pain.    . metroNIDAZOLE (METROGEL) 0.75 % vaginal gel Place 1 Applicatorful vaginally 2 (two) times daily. 70 g 0   No current facility-administered medications on file prior to visit.     No Known  Allergies  Pt patients past, family and social history were reviewed and updated.   Objective:  BP 136/86 (BP Location: Right Arm, Patient Position: Sitting, Cuff Size: Small)   Temp 97.8 F (36.6 C) (Oral)   Ht 5' 5.5" (1.664 m)   Wt 127 lb 9.6 oz (57.9 kg)   BMI 20.91 kg/m   Physical Exam  Constitutional: She is oriented to person, place, and time and well-developed, well-nourished, and in no distress.  HENT:  Head: Normocephalic and atraumatic.  Right Ear: Hearing and external ear normal.  Left Ear: Hearing and external ear normal.  Eyes: Conjunctivae are normal.  Neck: Normal range of motion.    Pulmonary/Chest: Effort normal.  Neurological: She is alert and oriented to person, place, and time. Gait normal.  Skin: Skin is warm and dry.  Psychiatric: Mood, memory, affect and judgment normal.  Vitals reviewed.   Assessment and Plan :  Lipoma of neck - Plan: Ambulatory referral to General Surgery - referral for evaluation about surgical removal.  Moderate single current episode of major depressive disorder ([place]) - Plan: escitalopram (LEXAPRO) 20 MG tablet, gabapentin (NEURONTIN) 100 MG capsule, DISCONTINUED: escitalopram (LEXAPRO) 20 MG tablet -  Attention deficit disorder (ADD) without hyperactivity - Plan: dextroamphetamine (DEXEDRINE SPANSULE) 10 MG 24 hr capsule  Irritability  Continue Lexapro but increase dose to 30mg  as this has helped her depressed mood.  Pt has been on many  ADD medications and this one has worked the best but it is now starting to not work but I have to wonder if that is because her depression and resulting anxiety and irritability is causing her inability to focus.  We will keep her ADD mediations at the current dose as she is on the highest effective dose for her medication and she has tried most of the other medications and they have not worked.  We will start gabapentin today to see if we can stabilize her mood and see if that will help with her  focus.  We will start with 100mg  and then she will recheck with me in 3 weeks and if she is tolerating this medication we will increase to 200mg .  Windell Hummingbird PA-C  [facility] [place] 03/29/2016 6:46 PM

## 2016-03-29 NOTE — Patient Instructions (Signed)
     IF you received an x-ray today, you will receive an invoice from [facility]. Please contact [facility] at [phone] with questions or concerns regarding your invoice.   IF you received labwork today, you will receive an invoice from [facility]. Please contact [facility] at [phone] with questions or concerns regarding your invoice.   Our billing staff will not be able to assist you with questions regarding bills from these companies.  You will be contacted with the lab results as soon as they are available. The fastest way to get your results is to activate your My Chart account. Instructions are located on the last page of this paperwork. If you have not heard from us regarding the results in 2 weeks, please contact this office.     

## 2016-04-02 ENCOUNTER — Ambulatory Visit: Payer: BLUE CROSS/BLUE SHIELD | Admitting: Physician Assistant

## 2016-04-09 ENCOUNTER — Ambulatory Visit: Payer: BLUE CROSS/BLUE SHIELD

## 2016-04-25 ENCOUNTER — Encounter: Payer: Self-pay | Admitting: Physician Assistant

## 2016-04-25 ENCOUNTER — Ambulatory Visit (INDEPENDENT_AMBULATORY_CARE_PROVIDER_SITE_OTHER): Payer: BLUE CROSS/BLUE SHIELD | Admitting: Physician Assistant

## 2016-04-25 VITALS — BP 139/93 | HR 116 | Temp 97.7°F | Resp 18 | Ht 65.5 in | Wt 132.0 lb

## 2016-04-25 DIAGNOSIS — Z789 Other specified health status: Secondary | ICD-10-CM

## 2016-04-25 DIAGNOSIS — Z309 Encounter for contraceptive management, unspecified: Secondary | ICD-10-CM | POA: Diagnosis not present

## 2016-04-25 DIAGNOSIS — F321 Major depressive disorder, single episode, moderate: Secondary | ICD-10-CM | POA: Diagnosis not present

## 2016-04-25 DIAGNOSIS — Z3041 Encounter for surveillance of contraceptive pills: Secondary | ICD-10-CM

## 2016-04-25 DIAGNOSIS — F988 Other specified behavioral and emotional disorders with onset usually occurring in childhood and adolescence: Secondary | ICD-10-CM

## 2016-04-25 LAB — POCT URINE PREGNANCY: Preg Test, Ur: NEGATIVE

## 2016-04-25 MED ORDER — GABAPENTIN 100 MG PO CAPS: 200.0000 mg | ORAL_CAPSULE | Freq: Every day | ORAL | 0 refills | Status: DC

## 2016-04-25 MED ORDER — ESCITALOPRAM OXALATE 20 MG PO TABS: 30.0000 mg | ORAL_TABLET | Freq: Every day | ORAL | 0 refills | Status: DC

## 2016-04-25 MED ORDER — MEDROXYPROGESTERONE ACETATE 150 MG/ML IM SUSY
150.0000 mg | PREFILLED_SYRINGE | Freq: Once | INTRAMUSCULAR | Status: AC
  Administered 2016-04-25: 150 mg via INTRAMUSCULAR

## 2016-04-25 MED ORDER — DEXTROAMPHETAMINE SULFATE ER 10 MG PO CP24: 20.0000 mg | ORAL_CAPSULE | Freq: Two times a day (BID) | ORAL | 0 refills | Status: DC

## 2016-04-25 NOTE — Progress Notes (Signed)
   Suzanne [name]  MRN: [id number] DOB: [birth year]  PCP: Elizabeth Sauer  Chief Complaint  Patient presents with  . Medication Refill    All except birth control     Subjective:  Pt presents to clinic for medication recheck.  She feels like she is doing much better on the new medication combination. She has more energy and more motivation and is getting things done in what she considers a healthy way.  Her friend is with her today and also agrees that her mood is improved.  She has been able to concentrate and focus better with the addition of the gabapentin.  She was on depo through [facility] - missed her injection in [place] and now they want her to be abstinent for 2 weeks and she states she is not willing to do that and she has not been using condoms because she does not like them.  She also really does not want to be pregnant.  She has not had her period since she missed her injection.  Review of Systems  Patient Active Problem List   Diagnosis Date Noted  . Irritability 03/29/2016  . Sinus tachycardia 08/07/2015  . Shortness of breath 08/07/2015  . SVT (supraventricular tachycardia) ([place]) 05/17/2015  . Major depression 05/03/2015  . Wolff-Parkinson-White syndrome 05/18/2014  . Smoker 05/18/2014  . ADD (attention deficit disorder) 08/18/2013    No current outpatient prescriptions on file prior to visit.   No current facility-administered medications on file prior to visit.     No Known Allergies  Pt patients past, family and social history were reviewed and updated.   Objective:  BP (!) 139/93   Pulse (!) 116   Temp 97.7 F (36.5 C) (Oral)   Resp 18   Ht 5' 5.5" (1.664 m)   Wt 132 lb (59.9 kg)   SpO2 96%   BMI 21.63 kg/m   Physical Exam  Constitutional: She is oriented to person, place, and time and well-developed, well-nourished, and in no distress.  HENT:  Head: Normocephalic and atraumatic.  Right Ear: Hearing and external ear normal.  Left Ear: Hearing and  external ear normal.  Eyes: Conjunctivae are normal.  Neck: Normal range of motion.  Pulmonary/Chest: Effort normal.  Neurological: She is alert and oriented to person, place, and time. Gait normal.  Skin: Skin is warm and dry.  Psychiatric: Mood, memory, affect and judgment normal.  Vitals reviewed.  Results for orders placed or performed in visit on 04/25/16  POCT urine pregnancy  Result Value Ref Range   Preg Test, Ur Negative Negative    Assessment and Plan :  Uses birth control - Plan: MedroxyPROGESTERone Acetate SUSY 150 mg - will do preg tet next visit - last unprotected sex was 2 weeks ago but patient is not interested or willing to not have sex or use a condom a quick start is the best option for her - we will give it to her today as she has been on depo for years  Surveillance for birth control, oral contraceptives - Plan: POCT urine pregnancy -  Attention deficit disorder (ADD) without hyperactivity - Plan: dextroamphetamine (DEXEDRINE SPANSULE) 10 MG 24 hr capsule - continue current medications.  Moderate single current episode of major depressive disorder ([place]) - Plan: escitalopram (LEXAPRO) 20 MG tablet, gabapentin (NEURONTIN) 100 MG capsule - continue current medications.  Recheck in 3-4 weeks  Windell Hummingbird PA-C  [facility] at [place] 04/29/2016 11:17 AM

## 2016-04-25 NOTE — Patient Instructions (Addendum)
  Stay with same dose of lexapro - increase the nightime gabapentin to 2 capsule  CONDOMS for 7 DAYS!!!!!     IF you received an x-ray today, you will receive an invoice from [facility]. Please contact [facility] at [phone] with questions or concerns regarding your invoice.   IF you received labwork today, you will receive an invoice from [facility]. Please contact [facility] at [phone] with questions or concerns regarding your invoice.   Our billing staff will not be able to assist you with questions regarding bills from these companies.  You will be contacted with the lab results as soon as they are available. The fastest way to get your results is to activate your My Chart account. Instructions are located on the last page of this paperwork. If you have not heard from [place] regarding the results in 2 weeks, please contact this office.

## 2016-05-18 ENCOUNTER — Other Ambulatory Visit: Payer: Self-pay | Admitting: Physician Assistant

## 2016-05-18 DIAGNOSIS — F321 Major depressive disorder, single episode, moderate: Secondary | ICD-10-CM

## 2016-05-20 NOTE — Telephone Encounter (Signed)
Pt calling about a RX on her lexapro please call pt when sent

## 2016-05-25 MED ORDER — ESCITALOPRAM OXALATE 20 MG PO TABS: 30.0000 mg | ORAL_TABLET | Freq: Every day | ORAL | 0 refills | Status: DC

## 2016-05-28 ENCOUNTER — Ambulatory Visit (INDEPENDENT_AMBULATORY_CARE_PROVIDER_SITE_OTHER): Payer: BLUE CROSS/BLUE SHIELD | Admitting: Physician Assistant

## 2016-05-28 ENCOUNTER — Encounter: Payer: Self-pay | Admitting: Physician Assistant

## 2016-05-28 VITALS — BP 135/99 | HR 116 | Temp 98.0°F | Resp 18 | Ht 65.5 in | Wt 135.0 lb

## 2016-05-28 DIAGNOSIS — F988 Other specified behavioral and emotional disorders with onset usually occurring in childhood and adolescence: Secondary | ICD-10-CM | POA: Diagnosis not present

## 2016-05-28 DIAGNOSIS — F321 Major depressive disorder, single episode, moderate: Secondary | ICD-10-CM | POA: Diagnosis not present

## 2016-05-28 DIAGNOSIS — T887XXA Unspecified adverse effect of drug or medicament, initial encounter: Secondary | ICD-10-CM

## 2016-05-28 DIAGNOSIS — R000 Tachycardia, unspecified: Secondary | ICD-10-CM | POA: Diagnosis not present

## 2016-05-28 DIAGNOSIS — R6882 Decreased libido: Secondary | ICD-10-CM

## 2016-05-28 MED ORDER — DEXTROAMPHETAMINE SULFATE ER 10 MG PO CP24: ORAL_CAPSULE | ORAL | 0 refills | Status: DC

## 2016-05-28 MED ORDER — GABAPENTIN 100 MG PO CAPS: ORAL_CAPSULE | ORAL | 0 refills | Status: DC

## 2016-05-28 MED ORDER — ESCITALOPRAM OXALATE 20 MG PO TABS: 30.0000 mg | ORAL_TABLET | Freq: Every day | ORAL | 0 refills | Status: DC

## 2016-05-28 NOTE — Patient Instructions (Signed)
     IF you received an x-ray today, you will receive an invoice from [facility]. Please contact [facility] at [phone] with questions or concerns regarding your invoice.   IF you received labwork today, you will receive an invoice from [facility]. Please contact [facility] at [phone] with questions or concerns regarding your invoice.   Our billing staff will not be able to assist you with questions regarding bills from these companies.  You will be contacted with the lab results as soon as they are available. The fastest way to get your results is to activate your My Chart account. Instructions are located on the last page of this paperwork. If you have not heard from us regarding the results in 2 weeks, please contact this office.     

## 2016-05-28 NOTE — Progress Notes (Signed)
Suzanne [name]  MRN: [id number] DOB: [birth year]  PCP: Elizabeth Sauer  Chief Complaint  Patient presents with  . Follow-up    4 week    Subjective:  Pt presents to clinic for medication recheck. She has been doing well on her mood medications.  She feels more even and less depressed since the change to Lexapro and adding the gabapentin.  She is glad that she is back on the Depo (she bled for 2 weeks but that has since stopped - she did a pregnancy test at home and it was negative).  She has again developed no sex drive and that is upsetting to her.  She knows that she will not take the pill regularly and wants to have a long acting method on contraception.   Still lacking motivation esp in the am - she on some occasions have increased her dose of Dexedrine in the am to 30mg  and that helps a lot - on days that she has done this she has decreased her afternoon dose to 10mg  and that is worse in the afternoon.  She feels like she could be more productive and an increase in morning dose.  Recent death of a friend from heroin.  She is upset about that.  She has had no events that would resemble mania.    Review of Systems  Constitutional: Negative for appetite change, chills, fever and unexpected weight change.  Cardiovascular: Negative for chest pain and palpitations.  Genitourinary: Positive for menstrual problem (irregular menses after restart of depo).  Psychiatric/Behavioral: Positive for decreased concentration and dysphoric mood (improved). Negative for sleep disturbance.    Patient Active Problem List   Diagnosis Date Noted  . Irritability 03/29/2016  . Sinus tachycardia 08/07/2015  . Shortness of breath 08/07/2015  . SVT (supraventricular tachycardia) ([place]) 05/17/2015  . Major depression 05/03/2015  . Wolff-Parkinson-White syndrome 05/18/2014  . Smoker 05/18/2014  . ADD (attention deficit disorder) 08/18/2013    No current outpatient prescriptions on file prior to visit.    No current facility-administered medications on file prior to visit.     No Known Allergies  Pt patients past, family and social history were reviewed and updated.   Objective:  BP (!) 135/99   Pulse (!) 116   Temp 98 F (36.7 C) (Oral)   Resp 18   Ht 5' 5.5" (1.664 m)   Wt 135 lb (61.2 kg)   SpO2 98%   BMI 22.12 kg/m   Physical Exam  Constitutional: She is oriented to person, place, and time and well-developed, well-nourished, and in no distress.  HENT:  Head: Normocephalic and atraumatic.  Right Ear: Hearing and external ear normal.  Left Ear: Hearing and external ear normal.  Eyes: Conjunctivae are normal.  Neck: Normal range of motion.  Pulmonary/Chest: Effort normal.  Neurological: She is alert and oriented to person, place, and time. Gait normal.  Skin: Skin is warm and dry.  Psychiatric: Mood, memory, affect and judgment normal.  Vitals reviewed.   Assessment and Plan :   Problem List Items Addressed This Visit      Other   ADD (attention deficit disorder)    Trial of increase Dexedrine to 30mg  in am and 20mg  in the afternoon - 3 Rx were given and she will recheck in 3 months      Relevant Medications   dextroamphetamine (DEXEDRINE SPANSULE) 10 MG 24 hr capsule   dextroamphetamine (DEXEDRINE) 10 MG 24 hr capsule   dextroamphetamine (DEXEDRINE) 10 MG 24  hr capsule   Major depression    For now continue the Lexapro at 30mg  and the gabapentin at 200mg  - we will do this for 3 months as we increase her Dexedrine dose to see if her motivation improves      Relevant Medications   gabapentin (NEURONTIN) 100 MG capsule   escitalopram (LEXAPRO) 20 MG tablet   Sinus tachycardia    Continues - along with elevated BP today - we will continue to monitor - if this stays high we will refer back to cardiology to make sure everything is ok with her medicaiton       Other Visit Diagnoses    Medication side effect    -  Primary   Low libido    - a side effect from  her depoprovera - for now pt will deal with the side effect - there is the option of changing to OCP but the patient is not a complaint patient and this is likely to result in unwanted pregnancy - try to determine if there is a medication to help with ehr SE like buspar maybe?      Windell Hummingbird PA-C  [facility] at [facility] 05/28/2016 5:04 PM

## 2016-05-28 NOTE — Assessment & Plan Note (Signed)
For now continue the Lexapro at 30mg  and the gabapentin at 200mg  - we will do this for 3 months as we increase her Dexedrine dose to see if her motivation improves

## 2016-05-28 NOTE — Assessment & Plan Note (Signed)
Continues - along with elevated BP today - we will continue to monitor - if this stays high we will refer back to cardiology to make sure everything is ok with her medicaiton

## 2016-05-28 NOTE — Assessment & Plan Note (Signed)
Trial of increase Dexedrine to 30mg  in am and 20mg  in the afternoon - 3 Rx were given and she will recheck in 3 months

## 2016-05-28 NOTE — Assessment & Plan Note (Deleted)
For now continue the Lexapro at 30mg  and the gabapentin at 200mg  - we will do this for 3 months as we increase her Dexedrine dose to see if her motivation improves

## 2016-06-20 ENCOUNTER — Other Ambulatory Visit: Payer: Self-pay | Admitting: Physician Assistant

## 2016-06-20 DIAGNOSIS — F321 Major depressive disorder, single episode, moderate: Secondary | ICD-10-CM

## 2016-08-15 ENCOUNTER — Other Ambulatory Visit: Payer: Self-pay | Admitting: Physician Assistant

## 2016-08-15 DIAGNOSIS — F321 Major depressive disorder, single episode, moderate: Secondary | ICD-10-CM

## 2016-08-27 ENCOUNTER — Ambulatory Visit: Payer: BLUE CROSS/BLUE SHIELD | Admitting: Physician Assistant

## 2016-08-29 ENCOUNTER — Other Ambulatory Visit: Payer: Self-pay | Admitting: Physician Assistant

## 2016-08-29 ENCOUNTER — Ambulatory Visit: Payer: BLUE CROSS/BLUE SHIELD | Admitting: Physician Assistant

## 2016-08-29 DIAGNOSIS — F988 Other specified behavioral and emotional disorders with onset usually occurring in childhood and adolescence: Secondary | ICD-10-CM

## 2016-08-29 DIAGNOSIS — F321 Major depressive disorder, single episode, moderate: Secondary | ICD-10-CM

## 2016-08-29 MED ORDER — DEXTROAMPHETAMINE SULFATE ER 10 MG PO CP24: ORAL_CAPSULE | ORAL | 0 refills | Status: DC

## 2016-08-29 MED ORDER — GABAPENTIN 100 MG PO CAPS: ORAL_CAPSULE | ORAL | 0 refills | Status: DC

## 2016-08-29 MED ORDER — ESCITALOPRAM OXALATE 20 MG PO TABS: 30.0000 mg | ORAL_TABLET | Freq: Every day | ORAL | 0 refills | Status: DC

## 2016-08-29 NOTE — Telephone Encounter (Signed)
Please advise 

## 2016-08-29 NOTE — Telephone Encounter (Signed)
Pt calling requesting a refill of her Gabapentin and dexatroamphetamine. Pt said she is out. Pt missed her appt today but is rescheduled for 09/09/16 to see Keryl. Please advise.

## 2016-08-29 NOTE — Telephone Encounter (Signed)
Ready to pick up.  

## 2016-08-30 NOTE — Telephone Encounter (Signed)
Left message script is ready for pick up.

## 2016-09-09 ENCOUNTER — Ambulatory Visit: Payer: BLUE CROSS/BLUE SHIELD | Admitting: Physician Assistant

## 2016-09-13 ENCOUNTER — Encounter: Payer: Self-pay | Admitting: Physician Assistant

## 2016-09-13 ENCOUNTER — Ambulatory Visit (INDEPENDENT_AMBULATORY_CARE_PROVIDER_SITE_OTHER): Payer: BLUE CROSS/BLUE SHIELD | Admitting: Physician Assistant

## 2016-09-13 VITALS — BP 122/84 | HR 112 | Temp 97.5°F | Resp 18 | Ht 65.5 in | Wt 136.0 lb

## 2016-09-13 DIAGNOSIS — Z7251 High risk heterosexual behavior: Secondary | ICD-10-CM

## 2016-09-13 DIAGNOSIS — F988 Other specified behavioral and emotional disorders with onset usually occurring in childhood and adolescence: Secondary | ICD-10-CM

## 2016-09-13 DIAGNOSIS — F321 Major depressive disorder, single episode, moderate: Secondary | ICD-10-CM

## 2016-09-13 DIAGNOSIS — I471 Supraventricular tachycardia: Secondary | ICD-10-CM | POA: Diagnosis not present

## 2016-09-13 DIAGNOSIS — Z3009 Encounter for other general counseling and advice on contraception: Secondary | ICD-10-CM | POA: Diagnosis not present

## 2016-09-13 DIAGNOSIS — Z13220 Encounter for screening for lipoid disorders: Secondary | ICD-10-CM

## 2016-09-13 DIAGNOSIS — Z205 Contact with and (suspected) exposure to viral hepatitis: Secondary | ICD-10-CM

## 2016-09-13 DIAGNOSIS — I456 Pre-excitation syndrome: Secondary | ICD-10-CM

## 2016-09-13 DIAGNOSIS — Z114 Encounter for screening for human immunodeficiency virus [HIV]: Secondary | ICD-10-CM

## 2016-09-13 LAB — POCT URINE PREGNANCY: Preg Test, Ur: NEGATIVE

## 2016-09-13 MED ORDER — GABAPENTIN 400 MG PO CAPS: 400.0000 mg | ORAL_CAPSULE | Freq: Every day | ORAL | 0 refills | Status: DC

## 2016-09-13 MED ORDER — MEDROXYPROGESTERONE ACETATE 150 MG/ML IM SUSP
150.0000 mg | Freq: Once | INTRAMUSCULAR | Status: AC
  Administered 2016-09-13: 150 mg via INTRAMUSCULAR

## 2016-09-13 MED ORDER — DEXTROAMPHETAMINE SULFATE ER 10 MG PO CP24: ORAL_CAPSULE | ORAL | 0 refills | Status: DC

## 2016-09-13 MED ORDER — ESCITALOPRAM OXALATE 20 MG PO TABS: 30.0000 mg | ORAL_TABLET | Freq: Every day | ORAL | 0 refills | Status: DC

## 2016-09-13 NOTE — Patient Instructions (Signed)
     IF you received an x-ray today, you will receive an invoice from [facility]. Please contact [facility] at [phone] with questions or concerns regarding your invoice.   IF you received labwork today, you will receive an invoice from [facility]. Please contact [facility] at [phone] with questions or concerns regarding your invoice.   Our billing staff will not be able to assist you with questions regarding bills from these companies.  You will be contacted with the lab results as soon as they are available. The fastest way to get your results is to activate your My Chart account. Instructions are located on the last page of this paperwork. If you have not heard from us regarding the results in 2 weeks, please contact this office.     

## 2016-09-13 NOTE — Progress Notes (Signed)
Chief Complaint  Patient presents with  . Medication Refill    "needs to get straight for future refills"    HPI Suzanne [name] is a 26 yo female who comes to the clinic for medication refills. Mom is present.  She is here for medication refills of dextroamphetamine sulfate, escitalopram  Oxalate, and gabapentin. She had an ablation for her [employer] done by Thompson Grayer, MD on 08/07/2015, which they noted she should stop taking her dextroamphetamine sulfate due to her sinus tachycardia  Her last Depo-Provera shot was in March, and would like one today.  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  Review of Systems  Constitutional: Negative for fever and malaise/fatigue.  HENT: Negative for congestion.   Eyes: Negative for blurred vision and double vision.  Respiratory: Negative for cough, shortness of breath and wheezing.   Cardiovascular: Negative for chest pain.  Gastrointestinal: Negative for abdominal pain, constipation, diarrhea, heartburn, nausea and vomiting.  Neurological: Negative for dizziness and headaches.  Psychiatric/Behavioral: Positive for depression (patient did not want to talk about due to mom in the room). Negative for suicidal ideas.    Patient Active Problem List   Diagnosis Date Noted  . Irritability 03/29/2016  . Sinus tachycardia 08/07/2015  . Shortness of breath 08/07/2015  . SVT (supraventricular tachycardia) ([place]) 05/17/2015  . Major depression 05/03/2015  . Wolff-Parkinson-White syndrome 05/18/2014  . Smoker 05/18/2014  . ADD (attention deficit disorder) 08/18/2013   Prior to Admission medications   Medication Sig Start Date End Date Taking? Authorizing Provider  dextroamphetamine (DEXEDRINE SPANSULE) 10 MG 24 hr  capsule 30mg  qam and 20 mg q afternoon 08/29/16  Yes Weber, Sarah L, PA-C  dextroamphetamine (DEXEDRINE) 10 MG 24 hr capsule 30mg  in the qam and 20mg  in the afternoon 05/28/16  Yes Weber, Damaris Hippo, PA-C  dextroamphetamine (DEXEDRINE) 10 MG 24 hr capsule 30 mg in the am and 20mg  in the afternoon 05/28/16  Yes Weber, Sarah L, PA-C  escitalopram (LEXAPRO) 20 MG tablet TAKE 1 AND 1/2 TABLETS(30 MG) BY MOUTH DAILY 08/16/16  Yes Weber, Sarah L, PA-C  escitalopram (LEXAPRO) 20 MG tablet Take 1.5 tablets (30 mg total) by mouth daily. 08/29/16  Yes Weber, Sarah L, PA-C  gabapentin (NEURONTIN) 100 MG capsule TAKE 2 CAPSULES(200 MG) BY MOUTH AT BEDTIME 05/28/16  Yes Weber, Sarah L, PA-C  gabapentin (NEURONTIN) 100 MG capsule TAKE 2 CAPSULES(200 MG) BY MOUTH AT BEDTIME 08/29/16  Yes Weber, Sarah L, PA-C   No Known Allergies  Vital signs BP (!) 135/98   Pulse (!) 112   Temp (!) 97.5 F (36.4 C) (Oral)   Resp 18   Ht 5' 5.5" (1.664 m)   Wt 136 lb (61.7 kg)   SpO2 98%   BMI 22.29 kg/m  Physical Exam  Constitutional: She appears well-developed and well-nourished.  Cardiovascular: Normal rate, regular rhythm and normal heart sounds.   Pulses:      Radial pulses are 2+ on the right side, and 2+ on the left side.  Pulmonary/Chest: Effort normal and breath sounds normal.  Lymphadenopathy:    She has no cervical adenopathy.       Right: No supraclavicular adenopathy present.       Left:  No supraclavicular adenopathy present.  Skin:  Patient has multiple ecchymoses on her left upper arm.   Assessment and Plan

## 2016-09-13 NOTE — Progress Notes (Signed)
Suzanne [name]  MRN: [id number] DOB: 1990/05/03  PCP: Mancel Bale, PA-C  Chief Complaint  Patient presents with  . Medication Refill    "needs to get straight for future refills"    Subjective:  Pt presents to clinic for medication refills.  ADD - continues to want to be on her medication despite the cardiology suggestion to stop due to rhythm issues.  She is not sure why it is a problem now.  Depression - really bad since the suicide of boyfriend by his shooting himself in May 5.  3 weeks ago she was really depressed and she got a gun out of her house and thought she was going to kill herself - she decided to smoke marijuana to calm herself and she did and then she has not had thought of suicide again.  She has used cocaine a few times about 2 weeks ago being the last.  Things the gabapentin is helping.  Birth control - wants her depo - she missed the last injection - she needs her appt to all at the same time due to no transportation.  Tachycardia - no recent symptoms - has not seen cardiologist.  Would like STD testing. 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  History is obtained by patient.  Review of Systems  Respiratory: Negative for cough.   Cardiovascular: Negative for chest pain and palpitations.  Psychiatric/Behavioral: Positive for decreased concentration, dysphoric mood, sleep disturbance and suicidal ideas. The patient is nervous/anxious.     Patient Active Problem List   Diagnosis Date Noted  . Irritability 03/29/2016  . Sinus tachycardia 08/07/2015  . Shortness of breath 08/07/2015  . SVT (supraventricular tachycardia) ([place]) 05/17/2015  . Major depression 05/03/2015  . Wolff-Parkinson-White syndrome 05/18/2014  . Smoker 05/18/2014  . ADD (attention deficit disorder) 08/18/2013    Current Outpatient Prescriptions on File Prior to Visit  Medication Sig Dispense Refill  .  dextroamphetamine (DEXEDRINE) 10 MG 24 hr capsule 30 mg in the am and 51m in the afternoon 150 capsule 0   No current facility-administered medications on file prior to visit.     No Known Allergies  Past Medical History:  Diagnosis Date  . Abnormal Pap smear 09/2008, 2013   LGSIL  at age 89   . 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 Amenorrhea    on DepoProvera  . H/O: depression   . Scoliosis    idiopathic   . WPW (Wolff-Parkinson-White syndrome)    Social History   Social History Narrative   Works for a r[employer]scape [employer]- she is not running any more - [employer]  Social History  Substance Use Topics  . Smoking status: Current Every Day Smoker    Packs/day: 0.00    Years: 3.00    Types: Cigarettes  . Smokeless tobacco: Never Used  . Alcohol use 0.0 oz/week     Comment: 2 glasses of wine a day   family history includes ADD / ADHD in her brother and father; Alcoholism in her maternal grandfather, maternal grandmother, maternal uncle, and other; Anxiety disorder in her maternal grandfather and mother; Aplastic anemia in her brother; Arrhythmia in her brother; Breast cancer in her paternal grandmother; Cancer in her paternal grandfather; Diabetes in her paternal grandfather; Drug abuse in her father; Hyperlipidemia in her maternal grandfather; Hypertension in her maternal grandmother; Other in her brother  and brother.     Objective:  BP 122/84   Pulse (!) 112   Temp (!) 97.5 F (36.4 C) (Oral)   Resp 18   Ht 5' 5.5" (1.664 m)   Wt 136 lb (61.7 kg)   SpO2 98%   BMI 22.29 kg/m  Body mass index is 22.29 kg/m.  Physical Exam  Constitutional: She is oriented to person, place, and time and well-developed, well-nourished, and in no distress.  HENT:  Head: Normocephalic and atraumatic.  Right Ear: Hearing and external ear normal.  Left Ear: Hearing and external ear normal.  Eyes: Conjunctivae are normal.  Neck: Normal range of motion.    Cardiovascular: Normal rate, regular rhythm and normal heart sounds.   No murmur heard. Pulmonary/Chest: Effort normal and breath sounds normal. She has no wheezes.  Neurological: She is alert and oriented to person, place, and time. Gait normal.  Skin: Skin is warm and dry.  Bruising on left upper arm - states she is "roofer"  Psychiatric: Mood, memory and affect normal. She expresses impulsivity. She does not exhibit a depressed mood. She expresses no suicidal (not currently - very lazafair in regards to these questions) ideation. She expresses no suicidal plans.  Irritated by most of what I had to say at the appt  Vitals reviewed.   Assessment and Plan :  Birth control counseling - Plan: medroxyPROGESTERone (DEPO-PROVERA) injection 150 mg, POCT urine pregnancy  Attention deficit disorder (ADD) without hyperactivity - Plan: dextroamphetamine (DEXEDRINE) 10 MG 24 hr capsule, dextroamphetamine (DEXEDRINE SPANSULE) 10 MG 24 hr capsule, ToxAssure Select,+Antidepr,UR, Ambulatory referral to Cardiology  Current moderate episode of major depressive disorder without prior episode ([place]) - Plan: escitalopram (LEXAPRO) 20 MG tablet, ToxAssure Select,+Antidepr,UR, gabapentin (NEURONTIN) 400 MG capsule  SVT (supraventricular tachycardia) ([place]) - Plan: Ambulatory referral to Cardiology, TSH, CMP14+EGFR  Wolff-Parkinson-White syndrome - Plan: Ambulatory referral to Cardiology  Exposure to hepatitis C - Plan: HCV Antibody RFX to Quant PCR  Encounter for screening for HIV - Plan: HIV antibody  Screening, lipid - Plan: CANCELED: Lipid panel  High risk sexual behavior - Plan: RPR, Hepatitis B surface antigen   Pt is presenting with risky behavior including drugs.  I have taken care of her until now but to continue Rx stimulants she will need a cardiology referral with the blessings to continue to do so.  I they encourage to be off them she will need a psychiatrist to continue this care which will also  benefit her likely mood disorder.  We will increase her gabapentin to see if we can stabilize her mood a little more.  She went through a stressful event with boyfriend suicide - suicide discussion occurred.  Mom was with patient but patient did not want mom in the room - mom left but was upset about being asked to leave.  Windell Hummingbird PA-C  [facility] 09/18/2016 7:22 PM

## 2016-09-17 LAB — HEPATITIS B SURFACE ANTIGEN: Hepatitis B Surface Ag: NEGATIVE

## 2016-09-17 LAB — CMP14+EGFR
ALBUMIN: 5.1 g/dL (ref 3.5–5.5)
ALK PHOS: 100 IU/L (ref 39–117)
ALT: 24 IU/L (ref 0–32)
AST: 50 IU/L — ABNORMAL HIGH (ref 0–40)
Albumin/Globulin Ratio: 2 (ref 1.2–2.2)
BUN / CREAT RATIO: 16 (ref 9–23)
BUN: 10 mg/dL (ref 6–20)
CO2: 17 mmol/L — ABNORMAL LOW (ref 20–29)
Calcium: 9.8 mg/dL (ref 8.7–10.2)
Chloride: 103 mmol/L (ref 96–106)
Creatinine, Ser: 0.64 mg/dL (ref 0.57–1.00)
GFR calc Af Amer: 143 mL/min/{1.73_m2} (ref >59)
GFR calc non Af Amer: 124 mL/min/{1.73_m2} (ref >59)
GLUCOSE: 92 mg/dL (ref 65–99)
Globulin, Total: 2.5 g/dL (ref 1.5–4.5)
POTASSIUM: 4.4 mmol/L (ref 3.5–5.2)
Sodium: 140 mmol/L (ref 134–144)
Total Protein: 7.6 g/dL (ref 6.0–8.5)

## 2016-09-17 LAB — LIPID PANEL
CHOLESTEROL TOTAL: 193 mg/dL (ref 100–199)
Chol/HDL Ratio: 1.7 ratio (ref 0.0–4.4)
HDL: 115 mg/dL (ref >39)
LDL Calculated: 23 mg/dL (ref 0–99)
Triglycerides: 275 mg/dL — ABNORMAL HIGH (ref 0–149)
VLDL Cholesterol Cal: 55 mg/dL — ABNORMAL HIGH (ref 5–40)

## 2016-09-17 LAB — RPR: RPR: NONREACTIVE

## 2016-09-17 LAB — HCV INTERPRETATION

## 2016-09-17 LAB — HCV AB W REFLEX TO QUANT PCR

## 2016-09-17 LAB — TSH: TSH: 1.31 u[IU]/mL (ref 0.450–4.500)

## 2016-09-17 LAB — HIV ANTIBODY (ROUTINE TESTING W REFLEX): HIV Screen 4th Generation wRfx: NONREACTIVE

## 2016-09-19 LAB — TOXASSURE SELECT,+ANTIDEPR,UR

## 2016-10-21 ENCOUNTER — Ambulatory Visit: Payer: BLUE CROSS/BLUE SHIELD | Admitting: Internal Medicine

## 2016-10-30 ENCOUNTER — Telehealth: Payer: Self-pay | Admitting: Physician Assistant

## 2016-10-30 DIAGNOSIS — F321 Major depressive disorder, single episode, moderate: Secondary | ICD-10-CM

## 2016-10-30 NOTE — Telephone Encounter (Signed)
Please advise 

## 2016-10-30 NOTE — Telephone Encounter (Signed)
Pt is needing to talk with sarah about her gabapentin being stolen and what all she needs to do to get it filled again  Best number [phone]

## 2016-11-01 MED ORDER — GABAPENTIN 400 MG PO CAPS: 400.0000 mg | ORAL_CAPSULE | Freq: Every day | ORAL | 0 refills | Status: DC

## 2016-11-01 NOTE — Telephone Encounter (Signed)
Pt advised.

## 2016-11-01 NOTE — Telephone Encounter (Signed)
refilled 

## 2016-11-07 ENCOUNTER — Encounter: Payer: Self-pay | Admitting: Internal Medicine

## 2016-11-15 ENCOUNTER — Ambulatory Visit: Payer: BLUE CROSS/BLUE SHIELD | Admitting: Internal Medicine

## 2016-12-04 ENCOUNTER — Telehealth: Payer: Self-pay | Admitting: Physician Assistant

## 2016-12-04 DIAGNOSIS — F321 Major depressive disorder, single episode, moderate: Secondary | ICD-10-CM

## 2016-12-04 NOTE — Telephone Encounter (Signed)
Patient calling to request a med refill on her dexedrine, lexapro and neurontin.  Adv pt med refill time frame is 48-72 business hours.

## 2016-12-05 NOTE — Telephone Encounter (Signed)
Please advise 

## 2016-12-09 ENCOUNTER — Telehealth: Payer: Self-pay | Admitting: Physician Assistant

## 2016-12-09 MED ORDER — ESCITALOPRAM OXALATE 20 MG PO TABS: 30.0000 mg | ORAL_TABLET | Freq: Every day | ORAL | 0 refills | Status: DC

## 2016-12-09 MED ORDER — GABAPENTIN 400 MG PO CAPS: 400.0000 mg | ORAL_CAPSULE | Freq: Every day | ORAL | 0 refills | Status: DC

## 2016-12-09 NOTE — Telephone Encounter (Signed)
Pt stated that she had to cancel cardiology appt due to transportation. Advised pt to reschedule for ongoing med management. Pt stated that she would scheduled today. Do you want to refill or hold off until appointment?

## 2016-12-09 NOTE — Telephone Encounter (Signed)
In order for you to continue to get dexedrine she needs to see the cardiologist - I did a referral in August but need to know if she has gone as I have not gotten info from that appt.  I will refill her lexapro and gabapentin

## 2016-12-09 NOTE — Telephone Encounter (Signed)
Pt is calling stating that she has scheduled an appt with the cardiologist for 11/21.  She is also wondering when she needs to come in for her next depo shot and she has not been advised of her lab results either. Please advise [phone]

## 2016-12-10 NOTE — Telephone Encounter (Signed)
She needs to have an appt and I need to get the cardiologist ok to continue her Dexedrine.

## 2016-12-11 NOTE — Telephone Encounter (Signed)
Pt message sent to Windell Hummingbird.

## 2016-12-12 NOTE — Telephone Encounter (Signed)
Labs look mostly unremarkable to me except the triglycerides. There is not a provider note in the labs from 8/3. Is there anything specifically you would like to address with patient?

## 2016-12-12 NOTE — Telephone Encounter (Signed)
Please review lab results with patient. Please check las depo date and give her the window. After her cardiology appt and I get the results that she is safe to still be one stimulants then I can write for her.

## 2016-12-16 NOTE — Telephone Encounter (Signed)
Pt would like to Suzanne [name] to call her back personally.  She says that two other cardiologists have approved her to be on the medicine before.  Her appointment is not until November 21 and she says she can not wait until then to get her medication.  [phone]

## 2016-12-16 NOTE — Telephone Encounter (Signed)
Please see note below. 

## 2016-12-16 NOTE — Telephone Encounter (Signed)
Labs all looked good.  All STDs were negative.  Triglycerides were slightly elevated but otherwise her cholesterol looked ok.  I will review the last several cardiologist reports but I think that I remember that the last did not recommend that she continue them.

## 2017-01-01 ENCOUNTER — Ambulatory Visit: Payer: BLUE CROSS/BLUE SHIELD | Admitting: Internal Medicine

## 2017-01-01 ENCOUNTER — Telehealth: Payer: Self-pay | Admitting: Physician Assistant

## 2017-01-01 ENCOUNTER — Encounter: Payer: Self-pay | Admitting: Internal Medicine

## 2017-01-01 VITALS — BP 134/90 | HR 134 | Ht 65.5 in | Wt 135.0 lb

## 2017-01-01 DIAGNOSIS — I456 Pre-excitation syndrome: Secondary | ICD-10-CM

## 2017-01-01 DIAGNOSIS — R0602 Shortness of breath: Secondary | ICD-10-CM | POA: Diagnosis not present

## 2017-01-01 DIAGNOSIS — I471 Supraventricular tachycardia: Secondary | ICD-10-CM | POA: Diagnosis not present

## 2017-01-01 DIAGNOSIS — F988 Other specified behavioral and emotional disorders with onset usually occurring in childhood and adolescence: Secondary | ICD-10-CM

## 2017-01-01 NOTE — Telephone Encounter (Signed)
Copied from [place]. Topic: Inquiry >> Jan 01, 2017  6:33 PM Oliver Pila B wrote: Reason for CRM: pt said she saw the cardiologist and they approved the dextroamphetamine Rx and she'll be ready to pick it up whenever its ready, contact pt or have pharmacy contact pt when ready

## 2017-01-01 NOTE — Progress Notes (Signed)
HPI [name] returns today for ongoing evaluation and management of severe fatigue after catheter ablation of WPW syndrome. 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 Unfortunately, she had recurrent accessory pathway conduction. Fortunately however she has had no recurrent symptomatic SVT. Prior to ablation, the patient had SVT at rates of 190 beats a minute, stopping and starting suddenly. Since her ablation, she does have palpitations but documented sinus tachycardia at 130 bpm. These episodes are quite different and are associated with physical and mental stress. They come on gradually and resolved gradually. The patient has had problems with ADD and has been on medical therapy which may well be exacerbating her sinus tachycardia. She feels fatigue and weakness and complains of shortness of breath and weakness with exertion. No Known Allergies   Current Outpatient Medications  Medication Sig Dispense Refill  . dextroamphetamine (DEXEDRINE SPANSULE) 10 MG 24 hr capsule 30mg  qam and 20 mg q afternoon 150 capsule 0  . dextroamphetamine (DEXEDRINE) 10 MG 24 hr capsule 30 mg in the am and 20mg  in the afternoon 150 capsule 0  . dextroamphetamine (DEXEDRINE) 10 MG 24 hr capsule 30mg  in the qam and 20mg  in the afternoon 150 capsule 0  . escitalopram (LEXAPRO) 20 MG tablet Take 1.5 tablets (30 mg total) by mouth daily. 135 tablet 0  . gabapentin (NEURONTIN) 400 MG capsule Take 1 capsule (400 mg total) by mouth at bedtime. 90 capsule 0   No current facility-administered medications for this visit.      Past Medical History:  Diagnosis Date  . Abnormal Pap smear 09/2008, 2013   LGSIL  at age 13    . 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 Amenorrhea    on DepoProvera  . H/O: depression   . Scoliosis      idiopathic   . WPW (Wolff-Parkinson-White syndrome)     ROS:   All systems reviewed and negative except as noted in the HPI.   Past Surgical History:  Procedure Laterality Date  . COLPOSCOPY  01/2010 & 02/2011    both were negative   . ELECTROPHYSIOLOGIC STUDY N/A 05/17/2015   SVT ablation by Dr Curt Bears  . WISDOM TOOTH EXTRACTION       Family History  Problem Relation Age of Onset  . Other Brother        substance abuse  . Hyperlipidemia Maternal Grandfather   . Anxiety disorder Maternal Grandfather   . Alcoholism Maternal Grandfather   . Anxiety disorder Mother   . ADD / ADHD Father   . Drug abuse Father   . ADD / ADHD Brother   . Other Brother        bone marrow transplant  . Breast cancer Paternal Grandmother   . Aplastic anemia Brother   . Hypertension Maternal Grandmother   . Alcoholism Maternal Grandmother   . Diabetes Paternal Grandfather   . Cancer Paternal Grandfather   . Arrhythmia Brother   . Alcoholism Other   . Alcoholism Maternal Uncle      Social History   Socioeconomic History  . Marital status: Single    Spouse name: Not on file  . Number of children: Not on file  . Years of education: Not on file  . Highest education level: Not on file  Social Needs  . Emergency planning/management officer  strain: Not on file  . Food insecurity - worry: Not on file  . Food insecurity - inability: Not on file  . Transportation needs - medical: Not on file  . Transportation needs - non-medical: Not on file  Occupational History  . Not on file  Tobacco Use  . Smoking status: Current Every Day Smoker    Packs/day: 0.00    Years: 3.00    Pack years: 0.00    Types: Cigarettes  . Smokeless tobacco: Never Used  Substance and Sexual Activity  . Alcohol use: Yes    Alcohol/week: 0.0 oz    Comment: 2 glasses of wine a day  . Drug use: No  . Sexual activity: Not Currently    Partners: Male    Birth control/protection: None  Other Topics Concern  . 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  Physical Exam:  Well appearing NAD HEENT: Unremarkable Neck:  No JVD, no thyromegally Lymphatics:  No adenopathy Back:  No CVA tenderness Lungs:  Clear HEART:  Regular rate rhythm, no murmurs, no rubs, no clicks Abd:  soft, positive bowel sounds, no organomegally, no rebound, no guarding Ext:  2 plus pulses, no edema, no cyanosis, no clubbing Skin:  No rashes no nodules Neuro:  CN II through XII intact, motor grossly intact  EKG normal sinus rhythm with marked ventricular preexcitation   Assess/Plan: 1. Manifest WPW syndrome - the patient has persistence of her accessory pathway, and has no symptomatic SVT. I've recommended watchful waiting and not recommended repeat catheter ablation at this time. If she had recurrent symptomatic SVT and catheter ablation would be recommended. 2. Exercise dyspnea and weakness- the patient thinks these occurred after her ablation. The etiology is unclear. I recommended cardiopulmonary stress testing. 3. ADD - I suspect the Adderall is contributing to her palpitations although I do not know what effect it may be having on her exertional dyspnea. For now, I have asked the patient to continue this medication but avoid alcohol and caffeine use. Recreational drug use is also to be avoided. 4.   Autonomic dysfunction  - Based on her symptoms and lability of heart rates and blood pressure, I suspect she has a component of autonomic dysfunction. We'll hold off on any medical therapy at this time although I might expect to start her on low-dose beta blocker therapy in the future.   Cristopher Peru, M.D.

## 2017-01-01 NOTE — Patient Instructions (Addendum)
Medication Instructions:  Your physician recommends that you continue on your current medications as directed. Please refer to the Current Medication list given to you today.  Labwork: None ordered.  Testing/Procedures: Your physician has recommended that you have a cardiopulmonary stress test (CPX). CPX testing is a non-invasive measurement of heart and lung function. It replaces a traditional treadmill stress test. This type of test provides a tremendous amount of information that relates not only to your present condition but also for future outcomes. This test combines measurements of you ventilation, respiratory gas exchange in the lungs, electrocardiogram (EKG), blood pressure and physical response before, during, and following an exercise protocol.  Please schedule for a CPX.  Follow-Up: Your physician wants you to follow-up based on results of stress test.  Any Other Special Instructions Will Be Listed Below (If Applicable).   If you need a refill on your cardiac medications before your next appointment, please call your pharmacy.

## 2017-01-03 NOTE — Telephone Encounter (Signed)
Please advise 

## 2017-01-07 MED ORDER — DEXTROAMPHETAMINE SULFATE ER 10 MG PO CP24: ORAL_CAPSULE | ORAL | 0 refills | Status: DC

## 2017-01-07 NOTE — Telephone Encounter (Signed)
Copied from Dr. Tanna Furry note 01/01/2017: ADD - I suspect the Adderall is contributing to her palpitations although I do not know what effect it may be having on her exertional dyspnea. For now, I have asked the patient to continue this medication but avoid alcohol and caffeine use. Recreational drug use is also to be avoided.   Rx sent electronically Meds ordered this encounter  Medications  . dextroamphetamine (DEXEDRINE SPANSULE) 10 MG 24 hr capsule    Sig: 30mg  qam and 20 mg q afternoon    Dispense:  150 capsule    Refill:  0    Order Specific Question:   Supervising Provider    Answer:   Brigitte Pulse, EVA N [[id number]]

## 2017-01-15 ENCOUNTER — Ambulatory Visit (HOSPITAL_COMMUNITY): Payer: BLUE CROSS/BLUE SHIELD | Attending: Internal Medicine

## 2017-01-15 DIAGNOSIS — I471 Supraventricular tachycardia: Secondary | ICD-10-CM | POA: Diagnosis not present

## 2017-01-15 DIAGNOSIS — R0602 Shortness of breath: Secondary | ICD-10-CM

## 2017-01-15 DIAGNOSIS — I456 Pre-excitation syndrome: Secondary | ICD-10-CM

## 2017-01-15 DIAGNOSIS — R0609 Other forms of dyspnea: Secondary | ICD-10-CM | POA: Insufficient documentation

## 2017-01-16 DIAGNOSIS — I456 Pre-excitation syndrome: Secondary | ICD-10-CM | POA: Diagnosis not present

## 2017-01-28 ENCOUNTER — Telehealth: Payer: Self-pay | Admitting: Internal Medicine

## 2017-01-28 NOTE — Telephone Encounter (Signed)
°  Follow Up ° °Calling to follow up on stress test results. Please call. °

## 2017-02-05 NOTE — Telephone Encounter (Signed)
Returned call to Pt.  Results of stress test given.  Notified Pt to walk 30 minutes daily for 6-8 weeks.  Follow up with Dr. Lovena Le in 3 months.  Given to scheduling.

## 2017-02-12 ENCOUNTER — Telehealth: Payer: Self-pay | Admitting: Physician Assistant

## 2017-02-12 DIAGNOSIS — F988 Other specified behavioral and emotional disorders with onset usually occurring in childhood and adolescence: Secondary | ICD-10-CM

## 2017-02-12 NOTE — Telephone Encounter (Signed)
Please advise 

## 2017-02-12 NOTE — Telephone Encounter (Signed)
Routed back to provider 

## 2017-02-12 NOTE — Telephone Encounter (Signed)
Copied from [place] [phone]. Topic: Quick Communication - Rx Refill/Question >> Feb 12, 2017 10:18 AM Synthia Innocent wrote: Has the patient contacted their pharmacy? Yes.     (Agent: If no, request that the patient contact the pharmacy for the refill.)   Preferred Pharmacy (with phone number or street name): [facility] [address]   Agent: Please be advised that RX refills may take up to 3 business days. We ask that you follow-up with your pharmacy. Requesting refill on dextroamphetamine (DEXEDRINE SPANSULE) 10 MG 24 hr capsule

## 2017-02-13 MED ORDER — DEXTROAMPHETAMINE SULFATE ER 10 MG PO CP24: ORAL_CAPSULE | ORAL | 0 refills | Status: DC

## 2017-02-13 NOTE — Telephone Encounter (Signed)
Meds ordered this encounter  Medications  . dextroamphetamine (DEXEDRINE SPANSULE) 10 MG 24 hr capsule    Sig: 30mg  qam and 20 mg q afternoon    Dispense:  150 capsule    Refill:  0    Order Specific Question:   Supervising Provider    Answer:   Brigitte Pulse, EVA N [[id number]]

## 2017-03-10 ENCOUNTER — Ambulatory Visit: Payer: Self-pay

## 2017-03-10 NOTE — Telephone Encounter (Signed)
Patient called and said "I got stuck by a used needle of a heroin addict earlier today. It bled a little, but now it's not bleeding. I got stuck in my finger." I advised the patient to go to the ED to get blood work done, she agreed to go to the ED.

## 2017-03-18 ENCOUNTER — Telehealth: Payer: Self-pay | Admitting: Physician Assistant

## 2017-03-18 DIAGNOSIS — F988 Other specified behavioral and emotional disorders with onset usually occurring in childhood and adolescence: Secondary | ICD-10-CM

## 2017-03-18 NOTE — Telephone Encounter (Signed)
Copied from [place]. Topic: Quick Communication - Rx Refill/Question >> Mar 18, 2017  4:32 PM Cecelia Byars, NT wrote: Medication:dextroamphetamine 10 mg Has the patient contacted their pharmacy? no: (Agent: If no, request that the patient contact the pharmacy for the refill.) Preferred Pharmacy (with phone number or street name):[facility] [address] Agent: Please be advised that RX refills may take up to 3 business days. We ask that you follow-up with your pharmacy.

## 2017-03-19 ENCOUNTER — Other Ambulatory Visit: Payer: Self-pay | Admitting: Physician Assistant

## 2017-03-19 DIAGNOSIS — F321 Major depressive disorder, single episode, moderate: Secondary | ICD-10-CM

## 2017-03-19 NOTE — Telephone Encounter (Signed)
Dexedrine refill Last OV: 09/13/16 Last Refill:02/12/17 Pharmacy:[facility]

## 2017-03-19 NOTE — Telephone Encounter (Signed)
Please advise 

## 2017-03-21 ENCOUNTER — Telehealth: Payer: Self-pay | Admitting: Physician Assistant

## 2017-03-21 MED ORDER — DEXTROAMPHETAMINE SULFATE ER 10 MG PO CP24
ORAL_CAPSULE | ORAL | 0 refills | Status: DC
Start: 2017-03-21 — End: 2017-04-18

## 2017-03-21 NOTE — Telephone Encounter (Signed)
Called pt and left message to let her know that Suzanne [name] had refilled her RX at [facility]. However, she does need to call the office and make an OV appt with [facility] she runs out of this refill.   When pt calls in, please make her an OV appt with Windell Hummingbird before the next 30 days.   Thanks!

## 2017-03-21 NOTE — Telephone Encounter (Signed)
Please have the patient schedule an OV with me prior to her running out of the refill I sent in for her today.

## 2017-03-21 NOTE — Addendum Note (Signed)
Addended by: Mancel Bale on: 03/21/2017 10:42 AM   Modules accepted: Orders

## 2017-03-21 NOTE — Telephone Encounter (Signed)
Called and left message letting pt know that the RX had been sent but she will need to make an OV with you before she runs out of this refill.  Thanks!

## 2017-04-07 ENCOUNTER — Ambulatory Visit: Payer: Self-pay | Admitting: Surgery

## 2017-04-07 NOTE — H&P (Signed)
Suzanne [name] Documented: 04/07/2017 9:58 AM Location: [facility] Patient #: [id number] DOB: [birth year] Single / Language: Suzanne [name] / Race: White Female  History of Present Illness Suzanne [name] A. Suzanne Prehn MD; 04/07/2017 10:37 AM) Patient words: Patient returns in follow-up for her lipoma on her posterior neck. She was seen last year for a 5 cm mass felt to be a lipoma location over posterior neck. She went excision but never followed through. Her insurance is running out of she wants to get that done as soon as possible. The lipoma is causing discomfort and impacting her quality of life. She has a history of Wolff-Parkinson-White syndrome and is seen Dr. Lovena [name] of cardiology. She's had an ablation which was unsuccessful but is not having any problems with palpitations or tachycardia.  The patient is a 27 year old female.   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 Escitalopram Oxalate (20MG  Tablet, Oral daily) Active. Medications Reconciled  Social History Suzanne [name], CMA; 04/07/2017 9:59 AM) Alcohol use Heavy alcohol use. Caffeine use Carbonated beverages, Coffee. Illicit drug use Remotely quit drug use. Tobacco use Current some day smoker.  Family History Suzanne Lull R. Suzanne [name], CMA; 04/07/2017 9:59 AM) Breast Cancer Family Members In General. Colon Cancer Family Members In General. Depression Brother, Mother. Hypertension Family Members In General. Respiratory  Condition Family Members In General.  Pregnancy / Birth History Suzanne [name], CMA; 04/07/2017 9:59 AM) Age at menarche 89 years. Contraceptive History Depo-provera. Gravida 0 Irregular periods Para 0  Other Problems (Suzanne [name], CMA; 04/07/2017 9:59 AM) Anxiety Disorder Back Pain Depression Other disease, cancer, significant illness    Vitals Suzanne [name] CMA; 04/07/2017 9:59 AM) 04/07/2017 9:58 AM Weight: 139.13 lb Height: 66in Body Surface Area: 1.71 m Body Mass Index: 22.46 kg/m  BP: 136/92 (Sitting, Left Arm, Standard)      Physical Exam (Suzanne [name] A. Suzanne Juday MD; 04/07/2017 10:38 AM)  General Mental Status-Alert. General Appearance-Consistent with stated age. Hydration-Well hydrated. Voice-Normal.  Integumentary Note: 5 cm lipoma posterior neck mobile and not fixed to any and like structures.  Eye Eyeball - Bilateral-Extraocular movements intact. Sclera/Conjunctiva - Bilateral-No scleral icterus.  Chest and Lung Exam Chest and lung exam reveals -quiet, even and easy respiratory effort with no use of accessory muscles and on auscultation, normal breath sounds, no adventitious sounds and normal vocal resonance. Inspection Chest Wall - Normal. Back - normal.  Cardiovascular Note: Sinus rhythm  Neurologic Neurologic evaluation reveals -alert and oriented x 3 with no impairment of recent or remote memory. Mental Status-Normal.    Assessment & Plan (Suzanne [name] A. Suzanne Ortman MD; 04/07/2017 10:39 AM)  LIPOMA (D17.9) Impression: lipoma 5 cm posterior neck  Scheduled for surgery. Discussed potential cardiac issues with patient and mother at bedside today. She appears stable from the cardiology note therefore I do not think she needs cardiac clearance unless requested by anesthesiology. Risk of bleeding, infection, nerve injury, blood vessel injury, recurrence, cardiovascular issues, pulmonary issues, and any further  procedures and/or surgeries. Discussed other palpitations which would be rare of cardiac, cardiovascular or pulmonary embolus.  Current Plans  The pathophysiology of skin & subcutaneous masses was discussed. Natural history risks without surgery were discussed. I recommended surgery to remove the mass. I explained the technique of removal with use of local anesthesia & possible need for more aggressive sedation/anesthesia for patient comfort.  Risks such as bleeding, infection, wound breakdown, heart attack, death, and other risks were discussed. I noted a good likelihood this will help address the problem. Possibility that this will not correct all symptoms was explained. Possibility of regrowth/recurrence of the mass was discussed. We will work to minimize complications. Questions were answered. The patient expresses understanding & wishes to proceed with surgery.  Pt Education - [facility] Free Text Education/Instructions: discussed with patient and provided information. Pt Education - [facility] Education - Written Instructions given: discussed with patient and provided information. Pt Education - [facility] - General recommendations Pt Education - [facility] General Post-op HCI

## 2017-04-07 NOTE — H&P (View-Only) (Signed)
Suzanne [name] Documented: 04/07/2017 9:58 AM Location: Suzanne [name] Patient #: [id number] DOB: 1990/03/23 Single / Language: Suzanne [name] / Race: White Female  History of Present Illness Marcello Moores A. Tsion Inghram MD; 04/07/2017 10:37 AM) Patient words: Patient returns in follow-up for her lipoma on her posterior neck. She was seen last year for a 5 cm mass felt to be a lipoma location over posterior neck. She went excision but never followed through. Her insurance is running out of she wants to get that done as soon as possible. The lipoma is causing discomfort and impacting her quality of life. She has a history of Wolff-Parkinson-White syndrome and is seen Dr. Lovena Le of cardiology. She's had an ablation which was unsuccessful but is not having any problems with palpitations or tachycardia.  The patient is a 27 year old female.   Problem List/Past Medical Sharyn Lull R. Brooks, CMA; 04/07/2017 9:59 AM) LIPOMA (D17.9)  Past Surgical History Sharyn Lull R. Brooks, CMA; 04/07/2017 9:59 AM) Valve Replacement  Diagnostic Studies History Sharyn Lull R. Brooks, CMA; 04/07/2017 9:59 AM) Colonoscopy never Mammogram never Pap Smear 1-5 years ago  Allergies Sharyn Lull R. Brooks, CMA; 04/07/2017 9:59 AM) No Known Drug Allergies [04/12/2016]:  Medication History Sharyn Lull R. Brooks, CMA; 04/07/2017 9:59 AM) Dextroamphetamine Sulfate ER (10MG  Capsule ER 24HR, Oral daily) Active. Escitalopram Oxalate (20MG  Tablet, Oral daily) Active. Medications Reconciled  Social History Sharyn Lull R. Brooks, CMA; 04/07/2017 9:59 AM) Alcohol use Heavy alcohol use. Caffeine use Carbonated beverages, Coffee. Illicit drug use Remotely quit drug use. Tobacco use Current some day smoker.  Family History Sharyn Lull R. Rolena Infante, CMA; 04/07/2017 9:59 AM) Breast Cancer Family Members In General. Colon Cancer Family Members In General. Depression Brother, Mother. Hypertension Family Members In General. Respiratory  Condition Family Members In General.  Pregnancy / Birth History Sharyn Lull R. Brooks, CMA; 04/07/2017 9:59 AM) Age at menarche 35 years. Contraceptive History Depo-provera. Gravida 0 Irregular periods Para 0  Other Problems (Michelle R. Brooks, CMA; 04/07/2017 9:59 AM) Anxiety Disorder Back Pain Depression Other disease, cancer, significant illness    Vitals Sharyn Lull R. Brooks CMA; 04/07/2017 9:59 AM) 04/07/2017 9:58 AM Weight: 139.13 lb Height: 66in Body Surface Area: 1.71 m Body Mass Index: 22.46 kg/m  BP: 136/92 (Sitting, Left Arm, Standard)      Physical Exam (Brigitta Pricer A. Vandora Jaskulski MD; 04/07/2017 10:38 AM)  General Mental Status-Alert. General Appearance-Consistent with stated age. Hydration-Well hydrated. Voice-Normal.  Integumentary Note: 5 cm lipoma posterior neck mobile and not fixed to any and like structures.  Eye Eyeball - Bilateral-Extraocular movements intact. Sclera/Conjunctiva - Bilateral-No scleral icterus.  Chest and Lung Exam Chest and lung exam reveals -quiet, even and easy respiratory effort with no use of accessory muscles and on auscultation, normal breath sounds, no adventitious sounds and normal vocal resonance. Inspection Chest Wall - Normal. Back - normal.  Cardiovascular Note: Sinus rhythm  Neurologic Neurologic evaluation reveals -alert and oriented x 3 with no impairment of recent or remote memory. Mental Status-Normal.    Assessment & Plan (Adri Schloss A. Dhruv Christina MD; 04/07/2017 10:39 AM)  LIPOMA (D17.9) Impression: lipoma 5 cm posterior neck  Scheduled for [name]. Discussed potential cardiac issues with patient and mother at bedside today. She appears stable from the cardiology note therefore I do not think she needs cardiac clearance unless requested by anesthesiology. Risk of bleeding, infection, nerve injury, blood vessel injury, recurrence, cardiovascular issues, pulmonary issues, and any further  procedures and/or surgeries. Discussed other palpitations which would be rare of cardiac, cardiovascular or pulmonary embolus.  Current Plans  The pathophysiology of skin & subcutaneous masses was discussed. Natural history risks without [name] were discussed. I recommended [name] to remove the mass. I explained the technique of removal with use of local anesthesia & possible need for more aggressive sedation/anesthesia for patient comfort.  Risks such as bleeding, infection, wound breakdown, heart attack, death, and other risks were discussed. I noted a good likelihood this will help address the problem. Possibility that this will not correct all symptoms was explained. Possibility of regrowth/recurrence of the mass was discussed. We will work to minimize complications. Questions were answered. The patient expresses understanding & wishes to proceed with [name].  Pt Education - [facility] Free Text Education/Instructions: discussed with patient and provided information. Pt Education - [facility] Education - Written Instructions given: discussed with patient and provided information. Pt Education - [facility] - General recommendations Pt Education - [facility] General Post-op HCI

## 2017-04-11 ENCOUNTER — Encounter: Payer: Self-pay | Admitting: *Deleted

## 2017-04-11 DIAGNOSIS — D170 Benign lipomatous neoplasm of skin and subcutaneous tissue of head, face and neck: Secondary | ICD-10-CM

## 2017-04-11 HISTORY — DX: Benign lipomatous neoplasm of skin and subcutaneous tissue of head, face and neck: D17.0

## 2017-04-15 ENCOUNTER — Encounter (HOSPITAL_BASED_OUTPATIENT_CLINIC_OR_DEPARTMENT_OTHER): Payer: Self-pay | Admitting: *Deleted

## 2017-04-15 ENCOUNTER — Other Ambulatory Visit: Payer: Self-pay

## 2017-04-15 NOTE — Pre-Procedure Instructions (Signed)
To come pick up Ensure pre-surgery drink 10 oz. - to drink by 0645 DOS. 

## 2017-04-15 NOTE — Pre-Procedure Instructions (Signed)
Cardiology note and EKG from 01/01/2017 reviewed by Dr. Perfecto Kingdom; pt. may come for scheduled surgery, will evaluate heart rate prior to surgery.

## 2017-04-17 ENCOUNTER — Ambulatory Visit: Payer: BLUE CROSS/BLUE SHIELD | Admitting: Internal Medicine

## 2017-04-18 ENCOUNTER — Other Ambulatory Visit: Payer: Self-pay

## 2017-04-18 ENCOUNTER — Ambulatory Visit: Payer: BLUE CROSS/BLUE SHIELD | Admitting: Physician Assistant

## 2017-04-18 ENCOUNTER — Encounter: Payer: Self-pay | Admitting: Physician Assistant

## 2017-04-18 VITALS — BP 126/88 | HR 134 | Temp 98.1°F | Resp 18 | Ht 66.0 in | Wt 139.0 lb

## 2017-04-18 DIAGNOSIS — F988 Other specified behavioral and emotional disorders with onset usually occurring in childhood and adolescence: Secondary | ICD-10-CM

## 2017-04-18 DIAGNOSIS — F321 Major depressive disorder, single episode, moderate: Secondary | ICD-10-CM

## 2017-04-18 DIAGNOSIS — W273XXA Contact with needle (sewing), initial encounter: Secondary | ICD-10-CM | POA: Diagnosis not present

## 2017-04-18 DIAGNOSIS — Z7251 High risk heterosexual behavior: Secondary | ICD-10-CM

## 2017-04-18 DIAGNOSIS — Z3042 Encounter for surveillance of injectable contraceptive: Secondary | ICD-10-CM

## 2017-04-18 DIAGNOSIS — IMO0001 Reserved for inherently not codable concepts without codable children: Secondary | ICD-10-CM

## 2017-04-18 DIAGNOSIS — S61239A Puncture wound without foreign body of unspecified finger without damage to nail, initial encounter: Secondary | ICD-10-CM | POA: Diagnosis not present

## 2017-04-18 DIAGNOSIS — Z23 Encounter for immunization: Secondary | ICD-10-CM

## 2017-04-18 MED ORDER — DEXTROAMPHETAMINE SULFATE ER 10 MG PO CP24: ORAL_CAPSULE | ORAL | 0 refills | Status: DC

## 2017-04-18 MED ORDER — ESCITALOPRAM OXALATE 20 MG PO TABS: 30.0000 mg | ORAL_TABLET | Freq: Every day | ORAL | 0 refills | Status: DC

## 2017-04-18 MED ORDER — MEDROXYPROGESTERONE ACETATE 150 MG/ML IM SUSY
150.0000 mg | PREFILLED_SYRINGE | INTRAMUSCULAR | Status: DC
Start: 2017-04-18 — End: 2017-12-31
  Administered 2017-04-18: 150 mg via INTRAMUSCULAR

## 2017-04-18 MED ORDER — GABAPENTIN 400 MG PO CAPS: 400.0000 mg | ORAL_CAPSULE | Freq: Every day | ORAL | 0 refills | Status: DC

## 2017-04-18 NOTE — Progress Notes (Signed)
Suzanne [name]  MRN: [id number] DOB: 1990/12/11  PCP: Mancel Bale, PA-C  Chief Complaint  Patient presents with  . Medication Refill    all meds     Subjective:  Pt presents to clinic for medication refills.  09/13/2016 -- last depo - sexually active without condoms - does not want to get pregnant.  She has not had a menses in years.    1 month ago got stuck on used heroin needle - this was from one of her workers who said he was the only one that had used the needle - she has had no blood work since then.  She also learned her boyfriend cheated on her more than once.  She is worried about HSV and wants to be tested in the blood (we talked about accuracy of test but she wants to proceed).  She has seen cardiology who says ok for stimulants and WPW as long as she is not using illegal drugs which she says she has not - it was a 1 time deal and she plans on not using again.  History is obtained by patient.    Review of Systems  Patient Active Problem List   Diagnosis Date Noted  . Irritability 03/29/2016  . Sinus tachycardia 08/07/2015  . Shortness of breath 08/07/2015  . SVT (supraventricular tachycardia) ([place]) 05/17/2015  . Major depression 05/03/2015  . Wolff-Parkinson-White syndrome 05/18/2014  . Smoker 05/18/2014  . ADD (attention deficit disorder) 08/18/2013    No current outpatient medications on file prior to visit.   No current facility-administered medications on file prior to visit.     No Known Allergies  Past Medical History:  Diagnosis Date  . 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 Lipoma of neck 04/2017   posterior  . Scoliosis    idiopathic   . 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 Smoking status: Current Every Day Smoker    Packs/day: 0.00    Years: 7.00   Pack years: 0.00    Types: Cigarettes  . Smokeless tobacco: Never Used  . Tobacco comment: 1 cig./day  Substance Use Topics  . Alcohol use: Yes    Alcohol/week: 0.6 oz    Types: 1 Glasses of wine per week    Comment: 1 glass wine/day  . Drug use: Yes    Types: Marijuana    Comment: last used 04/13/2017   family history includes ADD / ADHD in her brother and father; Alcoholism in her maternal grandfather, maternal grandmother, maternal uncle, and other; Anxiety disorder in her maternal grandfather and mother; Aplastic anemia in her brother; Arrhythmia in her brother; Breast cancer in her paternal grandmother; Cancer in her paternal grandfather; Diabetes in her paternal grandfather; Drug abuse in her father; Hyperlipidemia in her maternal grandfather; Hypertension in her maternal grandmother; Other in her brother and brother.     Objective:  BP 126/88   Pulse (!) 134   Temp 98.1 F (36.7 C) (Oral)   Resp 18   Ht 5\' 6"  (1.676 m)   Wt 139 lb (63 kg)   SpO2 99%   BMI 22.44 kg/m  Body mass index is 22.44 kg/m.  Physical Exam  Constitutional: She is oriented to person, place, and time and well-developed, well-nourished, and in no distress.  HENT:  Head: Normocephalic and atraumatic.  Right Ear: Hearing and external ear normal.  Left Ear: Hearing and external ear normal.  Eyes: Conjunctivae are normal.  Neck: Normal range of motion.  Cardiovascular: Normal rate, regular rhythm and normal heart sounds.  No murmur heard. Pulmonary/Chest: Effort normal and breath sounds normal. She has no wheezes.  Neurological: She is alert and oriented to person, place, and time. Gait normal.  Skin: Skin is warm and dry.  Psychiatric: Mood, memory, affect and judgment normal.  Vitals reviewed.   Assessment and Plan :  Current moderate episode of major depressive disorder without prior episode (HCC) - Plan: gabapentin (NEURONTIN) 400 MG capsule, escitalopram (LEXAPRO) 20 MG tablet  Continue  current medications  Attention deficit disorder (ADD) without hyperactivity - Plan: dextroamphetamine (DEXEDRINE SPANSULE) 10 MG 24 hr capsule, dextroamphetamine (DEXEDRINE) 10 MG 24 hr capsule, dextroamphetamine (DEXEDRINE) 10 MG 24 hr capsule - 3 months of medications written for patient - she will be seen every 3 months  Needlestick injury of finger, initial encounter - Plan: HIV 1 RNA quant-no reflex-bld, Hepatitis B surface antibody - check labs - she has baseline labs done in 8/18 - (will not check Hep c today it is to early) she has had her Hep B series but no immunity has been checked - we will check that today.  She was given the schedule of when she needs to have repeat testing in the future.  High risk heterosexual behavior - Plan: HSV(herpes simplex vrs) 1+2 ab-IgG - boyfriend cheated on her - she wants to make sure she is fine - she is not worried about gonorrhea or chlamydia - she is having no symptoms  Encounter for surveillance of injectable contraceptive - Plan: medroxyPROGESTERone Acetate SUSY 150 mg - pt is many months late.  She is not pregnant today.  She was given the injection and she will use back up for 7 days.  She was encouraged to take a pregnancy test in 2 weeks to ensure no pregnancy though she is low risk due to amount of time of depo.  Pt does plan to have an abortion if she is pregnant. She will have an appt to prevent her from missing this injection.  Windell Hummingbird PA-C  [facility] 04/18/2017 7:14 PM

## 2017-04-18 NOTE — Patient Instructions (Addendum)
Hep C at 6 weeks Wait for Hep B immunity results - if negative you will need repeat vaccine series Check HIV again at 3 month and 6 months with the antibody test - we did the RNA test today   IF you received an x-ray today, you will receive an invoice from [facility]. Please contact [facility] at [phone] with questions or concerns regarding your invoice.   IF you received labwork today, you will receive an invoice from [facility]. Please contact [facility] at [phone] with questions or concerns regarding your invoice.   Our billing staff will not be able to assist you with questions regarding bills from these companies.  You will be contacted with the lab results as soon as they are available. The fastest way to get your results is to activate your My Chart account. Instructions are located on the last page of this paperwork. If you have not heard from [place] regarding the results in 2 weeks, please contact this office.

## 2017-04-19 LAB — HSV(HERPES SIMPLEX VRS) I + II AB-IGG

## 2017-04-19 LAB — HEPATITIS B SURFACE ANTIBODY, QUANTITATIVE

## 2017-04-19 LAB — HIV-1 RNA QUANT-NO REFLEX-BLD

## 2017-04-21 NOTE — Addendum Note (Signed)
Addended by: Mancel Bale on: 04/21/2017 12:07 PM   Modules accepted: Orders

## 2017-05-01 ENCOUNTER — Other Ambulatory Visit: Payer: Self-pay

## 2017-05-01 ENCOUNTER — Encounter (HOSPITAL_BASED_OUTPATIENT_CLINIC_OR_DEPARTMENT_OTHER): Payer: Self-pay | Admitting: *Deleted

## 2017-05-01 ENCOUNTER — Telehealth: Payer: Self-pay | Admitting: Internal Medicine

## 2017-05-01 NOTE — Telephone Encounter (Signed)
New message      [facility] Pre-operative Risk Assessment    Request for surgical clearance:  1. What type of surgery is being performed? Incision  lipoma of the neck   2. When is this surgery scheduled? 05/07/17   3. What type of clearance is required (medical clearance vs. Pharmacy clearance to hold med vs. Both)? Cardiac clearance    4. Are there any medications that need to be held prior to surgery and how long?no blood thinner   5. Practice name and name of physician performing surgery? [facility] [phone] , Dr. Marcello Moores [place]  6. What is your office phone and fax number? Fax  # [phone] phone # [phone]  7. Anesthesia type (None, local, MAC, general) ? General    Marcine Matar 05/01/2017, 12:27 PM  _________________________________________________________________   (provider comments below)

## 2017-05-01 NOTE — Pre-Procedure Instructions (Signed)
To come pick up Ensure pre-surgery drink 10 oz. - to drink by 0515 DOS.

## 2017-05-02 NOTE — Telephone Encounter (Signed)
Call placed to Dr. Brantley Stage office.  Notified that verbal approval of procedure received from Dr. Lovena Le.  Office requested a fax with this described on it.  Faxed approval.  No further action needed.

## 2017-05-05 NOTE — Progress Notes (Signed)
Ensure Presurg given with instructions to complete by 0515 day of surgery. NPO otherwise. Verbalized understanding

## 2017-05-07 ENCOUNTER — Ambulatory Visit (HOSPITAL_BASED_OUTPATIENT_CLINIC_OR_DEPARTMENT_OTHER): Payer: BLUE CROSS/BLUE SHIELD | Admitting: Anesthesiology

## 2017-05-07 ENCOUNTER — Other Ambulatory Visit: Payer: Self-pay

## 2017-05-07 ENCOUNTER — Ambulatory Visit (HOSPITAL_BASED_OUTPATIENT_CLINIC_OR_DEPARTMENT_OTHER)
Admission: RE | Admit: 2017-05-07 | Discharge: 2017-05-07 | Disposition: A | Discharge status: Home / Self Care | Payer: BLUE CROSS/BLUE SHIELD | Source: Ambulatory Visit | Attending: Surgery | Admitting: Surgery

## 2017-05-07 ENCOUNTER — Encounter (HOSPITAL_BASED_OUTPATIENT_CLINIC_OR_DEPARTMENT_OTHER): Payer: Self-pay | Admitting: Anesthesiology

## 2017-05-07 ENCOUNTER — Encounter (HOSPITAL_BASED_OUTPATIENT_CLINIC_OR_DEPARTMENT_OTHER)
Admission: RE | Disposition: A | Discharge status: Home / Self Care | Payer: Self-pay | Source: Ambulatory Visit | Attending: Surgery

## 2017-05-07 DIAGNOSIS — F419 Anxiety disorder, unspecified: Secondary | ICD-10-CM | POA: Diagnosis not present

## 2017-05-07 DIAGNOSIS — Z79899 Other long term (current) drug therapy: Secondary | ICD-10-CM | POA: Insufficient documentation

## 2017-05-07 DIAGNOSIS — F17200 Nicotine dependence, unspecified, uncomplicated: Secondary | ICD-10-CM | POA: Insufficient documentation

## 2017-05-07 DIAGNOSIS — F329 Major depressive disorder, single episode, unspecified: Secondary | ICD-10-CM | POA: Diagnosis not present

## 2017-05-07 DIAGNOSIS — D170 Benign lipomatous neoplasm of skin and subcutaneous tissue of head, face and neck: Secondary | ICD-10-CM | POA: Insufficient documentation

## 2017-05-07 HISTORY — DX: Benign lipomatous neoplasm of skin and subcutaneous tissue of head, face and neck: D17.0

## 2017-05-07 HISTORY — PX: LIPOMA EXCISION: SHX5283

## 2017-05-07 LAB — POCT I-STAT, CHEM 8
BUN: 14 mg/dL (ref 6–20)
Calcium, Ion: 1.22 mmol/L (ref 1.15–1.40)
Chloride: 107 mmol/L (ref 101–111)
Creatinine, Ser: 0.7 mg/dL (ref 0.44–1.00)
GLUCOSE: 93 mg/dL (ref 65–99)
HEMATOCRIT: 41 % (ref 36.0–46.0)
HEMOGLOBIN: 13.9 g/dL (ref 12.0–15.0)
POTASSIUM: 4 mmol/L (ref 3.5–5.1)
Sodium: 138 mmol/L (ref 135–145)
TCO2: 21 mmol/L — ABNORMAL LOW (ref 22–32)

## 2017-05-07 LAB — POCT PREGNANCY, URINE: PREG TEST UR: NEGATIVE

## 2017-05-07 SURGERY — EXCISION LIPOMA
Anesthesia: General | Site: Neck

## 2017-05-07 MED ORDER — CEFAZOLIN SODIUM-DEXTROSE 2-4 GM/100ML-% IV SOLN
INTRAVENOUS | Status: AC
  Filled 2017-05-07: qty 100

## 2017-05-07 MED ORDER — ONDANSETRON HCL 4 MG/2ML IJ SOLN
INTRAMUSCULAR | Status: DC | PRN
  Administered 2017-05-07: 4 mg via INTRAVENOUS

## 2017-05-07 MED ORDER — LACTATED RINGERS IV SOLN
INTRAVENOUS | Status: DC
  Administered 2017-05-07: 08:00:00 via INTRAVENOUS

## 2017-05-07 MED ORDER — SCOPOLAMINE 1 MG/3DAYS TD PT72: 1.0000 | MEDICATED_PATCH | Freq: Once | TRANSDERMAL | Status: DC | PRN

## 2017-05-07 MED ORDER — PROPOFOL 10 MG/ML IV BOLUS
INTRAVENOUS | Status: DC | PRN
  Administered 2017-05-07: 200 mg via INTRAVENOUS

## 2017-05-07 MED ORDER — HYDROCODONE-ACETAMINOPHEN 5-325 MG PO TABS: 1.0000 | ORAL_TABLET | Freq: Four times a day (QID) | ORAL | 0 refills | Status: DC | PRN

## 2017-05-07 MED ORDER — MIDAZOLAM HCL 2 MG/2ML IJ SOLN
1.0000 mg | INTRAMUSCULAR | Status: DC | PRN
  Administered 2017-05-07: 2 mg via INTRAVENOUS

## 2017-05-07 MED ORDER — GABAPENTIN 300 MG PO CAPS
300.0000 mg | ORAL_CAPSULE | ORAL | Status: AC
  Administered 2017-05-07: 300 mg via ORAL

## 2017-05-07 MED ORDER — IBUPROFEN 800 MG PO TABS: 800.0000 mg | ORAL_TABLET | Freq: Three times a day (TID) | ORAL | 0 refills | Status: DC | PRN

## 2017-05-07 MED ORDER — OXYCODONE HCL 5 MG/5ML PO SOLN: 5.0000 mg | Freq: Once | ORAL | Status: AC | PRN

## 2017-05-07 MED ORDER — DEXAMETHASONE SODIUM PHOSPHATE 4 MG/ML IJ SOLN
INTRAMUSCULAR | Status: DC | PRN
  Administered 2017-05-07: 10 mg via INTRAVENOUS

## 2017-05-07 MED ORDER — OXYCODONE HCL 5 MG PO TABS
5.0000 mg | ORAL_TABLET | Freq: Once | ORAL | Status: AC | PRN
  Administered 2017-05-07: 5 mg via ORAL

## 2017-05-07 MED ORDER — CHLORHEXIDINE GLUCONATE CLOTH 2 % EX PADS: 6.0000 | MEDICATED_PAD | Freq: Once | CUTANEOUS | Status: DC

## 2017-05-07 MED ORDER — BUPIVACAINE-EPINEPHRINE 0.25% -1:200000 IJ SOLN
INTRAMUSCULAR | Status: DC | PRN
  Administered 2017-05-07: 20 mL

## 2017-05-07 MED ORDER — CEFAZOLIN SODIUM-DEXTROSE 2-4 GM/100ML-% IV SOLN
2.0000 g | INTRAVENOUS | Status: AC
  Administered 2017-05-07: 2 g via INTRAVENOUS

## 2017-05-07 MED ORDER — MEPERIDINE HCL 25 MG/ML IJ SOLN: 6.2500 mg | INTRAMUSCULAR | Status: DC | PRN

## 2017-05-07 MED ORDER — FENTANYL CITRATE (PF) 100 MCG/2ML IJ SOLN
50.0000 ug | INTRAMUSCULAR | Status: DC | PRN
  Administered 2017-05-07: 100 ug via INTRAVENOUS

## 2017-05-07 MED ORDER — ACETAMINOPHEN 500 MG PO TABS
1000.0000 mg | ORAL_TABLET | ORAL | Status: AC
  Administered 2017-05-07: 1000 mg via ORAL

## 2017-05-07 MED ORDER — HYDROMORPHONE HCL 1 MG/ML IJ SOLN: 0.2500 mg | INTRAMUSCULAR | Status: DC | PRN

## 2017-05-07 MED ORDER — PROMETHAZINE HCL 25 MG/ML IJ SOLN: 6.2500 mg | INTRAMUSCULAR | Status: DC | PRN

## 2017-05-07 MED ORDER — CELECOXIB 200 MG PO CAPS
200.0000 mg | ORAL_CAPSULE | ORAL | Status: AC
  Administered 2017-05-07: 200 mg via ORAL

## 2017-05-07 MED ORDER — DEXTROSE 5 % IV SOLN: 3.0000 g | INTRAVENOUS | Status: DC

## 2017-05-07 MED ORDER — CELECOXIB 200 MG PO CAPS
ORAL_CAPSULE | ORAL | Status: AC
  Filled 2017-05-07: qty 1

## 2017-05-07 MED ORDER — OXYCODONE HCL 5 MG PO TABS
ORAL_TABLET | ORAL | Status: AC
Start: 2017-05-07 — End: 2017-05-07
  Filled 2017-05-07: qty 1

## 2017-05-07 MED ORDER — ACETAMINOPHEN 500 MG PO TABS
ORAL_TABLET | ORAL | Status: AC
  Filled 2017-05-07: qty 2

## 2017-05-07 MED ORDER — GABAPENTIN 300 MG PO CAPS
ORAL_CAPSULE | ORAL | Status: AC
Start: 2017-05-07 — End: 2017-05-07
  Filled 2017-05-07: qty 1

## 2017-05-07 SURGICAL SUPPLY — 44 items
ADH SKN CLS APL DERMABOND .7 (GAUZE/BANDAGES/DRESSINGS)
APL SKNCLS STERI-STRIP NONHPOA (GAUZE/BANDAGES/DRESSINGS)
BENZOIN TINCTURE PRP APPL 2/3 (GAUZE/BANDAGES/DRESSINGS) IMPLANT
BLADE SURG 10 STRL SS (BLADE) IMPLANT
BLADE SURG 15 STRL LF DISP TIS (BLADE) ×1 IMPLANT
BLADE SURG 15 STRL SS (BLADE) ×3
CANISTER SUCT 1200ML W/VALVE (MISCELLANEOUS) IMPLANT
CHLORAPREP W/TINT 26ML (MISCELLANEOUS) ×3 IMPLANT
CLOSURE WOUND 1/2 X4 (GAUZE/BANDAGES/DRESSINGS)
COVER BACK TABLE 60X90IN (DRAPES) ×3 IMPLANT
COVER MAYO STAND STRL (DRAPES) ×3 IMPLANT
DECANTER SPIKE VIAL GLASS SM (MISCELLANEOUS) IMPLANT
DERMABOND ADVANCED (GAUZE/BANDAGES/DRESSINGS)
DERMABOND ADVANCED .7 DNX12 (GAUZE/BANDAGES/DRESSINGS) IMPLANT
DRAPE LAPAROTOMY 100X72 PEDS (DRAPES) ×3 IMPLANT
DRAPE UTILITY XL STRL (DRAPES) ×3 IMPLANT
ELECT COATED BLADE 2.86 ST (ELECTRODE) ×3 IMPLANT
ELECT REM PT RETURN 9FT ADLT (ELECTROSURGICAL) ×3
ELECTRODE REM PT RTRN 9FT ADLT (ELECTROSURGICAL) ×1 IMPLANT
GLOVE BIO SURGEON STRL SZ7 (GLOVE) ×2 IMPLANT
GLOVE BIOGEL PI IND STRL 8 (GLOVE) ×1 IMPLANT
GLOVE BIOGEL PI INDICATOR 8 (GLOVE) ×2
GLOVE ECLIPSE 8.0 STRL XLNG CF (GLOVE) ×3 IMPLANT
GOWN STRL REUS W/ TWL LRG LVL3 (GOWN DISPOSABLE) ×2 IMPLANT
GOWN STRL REUS W/TWL LRG LVL3 (GOWN DISPOSABLE) ×6
NDL HYPO 25X1 1.5 SAFETY (NEEDLE) ×1 IMPLANT
NEEDLE HYPO 25X1 1.5 SAFETY (NEEDLE) ×3 IMPLANT
NS IRRIG 1000ML POUR BTL (IV SOLUTION) IMPLANT
PACK BASIN DAY SURGERY FS (CUSTOM PROCEDURE TRAY) ×3 IMPLANT
PENCIL BUTTON HOLSTER BLD 10FT (ELECTRODE) ×3 IMPLANT
SLEEVE SCD COMPRESS KNEE MED (MISCELLANEOUS) ×3 IMPLANT
SPONGE LAP 4X18 X RAY DECT (DISPOSABLE) IMPLANT
STAPLER VISISTAT 35W (STAPLE) IMPLANT
STRIP CLOSURE SKIN 1/2X4 (GAUZE/BANDAGES/DRESSINGS) IMPLANT
SUT MON AB 4-0 PC3 18 (SUTURE) ×3 IMPLANT
SUT VIC AB 0 SH 27 (SUTURE) ×2 IMPLANT
SUT VICRYL 3-0 CR8 SH (SUTURE) IMPLANT
SUT VICRYL AB 3 0 TIES (SUTURE) IMPLANT
SYR CONTROL 10ML LL (SYRINGE) ×3 IMPLANT
TOWEL OR 17X24 6PK STRL BLUE (TOWEL DISPOSABLE) ×6 IMPLANT
TOWEL OR NON WOVEN STRL DISP B (DISPOSABLE) ×1 IMPLANT
TUBE CONNECTING 20'X1/4 (TUBING)
TUBE CONNECTING 20X1/4 (TUBING) IMPLANT
YANKAUER SUCT BULB TIP NO VENT (SUCTIONS) IMPLANT

## 2017-05-07 NOTE — Discharge Instructions (Signed)
GENERAL SURGERY: POST OP INSTRUCTIONS  ######################################################################  EAT Gradually transition to a high fiber diet with a fiber supplement over the next few weeks after discharge.  Start with a pureed / full liquid diet (see below)  WALK Walk an hour a day.  Control your pain to do that.    CONTROL PAIN Control pain so that you can walk, sleep, tolerate sneezing/coughing, go up/down stairs.  HAVE A BOWEL MOVEMENT DAILY Keep your bowels regular to avoid problems.  OK to try a laxative to override constipation.  OK to use an antidairrheal to slow down diarrhea.  Call if not better after 2 tries  CALL IF YOU HAVE PROBLEMS/CONCERNS Call if you are still struggling despite following these instructions. Call if you have concerns not answered by these instructions  ######################################################################    1. DIET: Follow a light bland diet the first 24 hours after arrival home, such as soup, liquids, crackers, etc.  Be sure to include lots of fluids daily.  Avoid fast food or heavy meals as your are more likely to get nauseated.   2. Take your usually prescribed home medications unless otherwise directed. 3. PAIN CONTROL: a. Pain is best controlled by a usual combination of three different methods TOGETHER: i. Ice/Heat ii. Over the counter pain medication iii. Prescription pain medication b. Most patients will experience some swelling and bruising around the incisions.  Ice packs or heating pads (30-60 minutes up to 6 times a day) will help. Use ice for the first few days to help decrease swelling and bruising, then switch to heat to help relax tight/sore spots and speed recovery.  Some people prefer to use ice alone, heat alone, alternating between ice & heat.  Experiment to what works for you.  Swelling and bruising can take several weeks to resolve.   c. It is helpful to take an over-the-counter pain medication  regularly for the first few weeks.  Choose one of the following that works best for you: i. Naproxen (Aleve, etc)  Two 220mg  tabs twice a day ii. Ibuprofen (Advil, etc) Three 200mg  tabs four times a day (every meal & bedtime) iii. Acetaminophen (Tylenol, etc) 500-650mg  four times a day (every meal & bedtime) d. A  prescription for pain medication (such as oxycodone, hydrocodone, etc) should be given to you upon discharge.  Take your pain medication as prescribed.  i. If you are having problems/concerns with the prescription medicine (does not control pain, nausea, vomiting, rash, itching, etc), please call us [phone] to see if we need to switch you to a different pain medicine that will work better for you and/or control your side effect better. ii. If you need a refill on your pain medication, please contact your pharmacy.  They will contact our office to request authorization. Prescriptions will not be filled after 5 pm or on week-ends. 4. Avoid getting constipated.  Between the surgery and the pain medications, it is common to experience some constipation.  Increasing fluid intake and taking a fiber supplement (such as Metamucil, Citrucel, FiberCon, MiraLax, etc) 1-2 times a day regularly will usually help prevent this problem from occurring.  A mild laxative (prune juice, Milk of Magnesia, MiraLax, etc) should be taken according to package directions if there are no bowel movements after 48 hours.   5. Wash / shower every day.  You may shower over the dressings as they are waterproof.  Continue to shower over incision(s) after the dressing is off. 6. Remove your waterproof  bandages 5 days after surgery.  You may leave the incision open to air.  You may have skin tapes (Steri Strips) covering the incision(s).  Leave them on until one week, then remove.  You may replace a dressing/Band-Aid to cover the incision for comfort if you wish.      7. ACTIVITIES as tolerated:   a. You may resume  regular (light) daily activities beginning the next day--such as daily self-care, walking, climbing stairs--gradually increasing activities as tolerated.  If you can walk 30 minutes without difficulty, it is safe to try more intense activity such as jogging, treadmill, bicycling, low-impact aerobics, swimming, etc. b. Save the most intensive and strenuous activity for last such as sit-ups, heavy lifting, contact sports, etc  Refrain from any heavy lifting or straining until you are off narcotics for pain control.   c. DO NOT PUSH THROUGH PAIN.  Let pain be your guide: If it hurts to do something, don't do it.  Pain is your body warning you to avoid that activity for another week until the pain goes down. d. You may drive when you are no longer taking prescription pain medication, you can comfortably wear a seatbelt, and you can safely maneuver your car and apply brakes. e. Dennis Bast may have sexual intercourse when it is comfortable.  8. FOLLOW UP in our office a. Please call [facility] at [phone] to set up an appointment to see your surgeon in the office for a follow-up appointment approximately 2-3 weeks after your surgery. b. Make sure that you call for this appointment the day you arrive home to insure a convenient appointment time. 9. IF YOU HAVE DISABILITY OR FAMILY LEAVE FORMS, BRING THEM TO THE OFFICE FOR PROCESSING.  DO NOT GIVE THEM TO YOUR DOCTOR.   WHEN TO CALL us [phone]: 1. Poor pain control 2. Reactions / problems with new medications (rash/itching, nausea, etc)  3. Fever over 101.5 F (38.5 C) 4. Worsening swelling or bruising 5. Continued bleeding from incision. 6. Increased pain, redness, or drainage from the incision 7. Difficulty breathing / swallowing   The clinic staff is available to answer your questions during regular business hours (8:30am-5pm).  Please dont hesitate to call and ask to speak to one of our nurses for clinical concerns.   If you have a medical emergency,  go to the nearest emergency room or call 911.  A surgeon from [facility] is always on call at the [facility], [place], [place], [place], [place]  [address] ? MAIN: [phone] ? TOLL FREE: [phone] ?  FAX [phone] [email or web address]   Last Dose of Tylenol taken at 8:19AM.   Post Anesthesia Home Care Instructions  Activity: Get plenty of rest for the remainder of the day. A responsible individual must stay with you for 24 hours following the procedure.  For the next 24 hours, DO NOT: -Drive a car -Paediatric nurse -Drink alcoholic beverages -Take any medication unless instructed by your physician -Make any legal decisions or sign important papers.  Meals: Start with liquid foods such as gelatin or soup. Progress to regular foods as tolerated. Avoid greasy, spicy, heavy foods. If nausea and/or vomiting occur, drink only clear liquids until the nausea and/or vomiting subsides. Call your physician if vomiting continues.  Special Instructions/Symptoms: Your throat may feel dry or sore from the anesthesia or the breathing tube placed in your throat during surgery. If this causes discomfort, gargle with warm  salt water. The discomfort should disappear within 24 hours.  If you had a scopolamine patch placed behind your ear for the management of post- operative nausea and/or vomiting:  1. The medication in the patch is effective for 72 hours, after which it should be removed.  Wrap patch in a tissue and discard in the trash. Wash hands thoroughly with soap and water. 2. You may remove the patch earlier than 72 hours if you experience unpleasant side effects which may include dry mouth, dizziness or visual disturbances. 3. Avoid touching the patch. Wash your hands with soap and water after contact with the patch.

## 2017-05-07 NOTE — Anesthesia Postprocedure Evaluation (Signed)
Anesthesia Post Note  Patient: Suzanne [name]  Procedure(s) Performed: EXCISION LIPOMA POSTERIOR NECK ERAS PATHWAY (N/A Neck)     Patient location during evaluation: PACU Anesthesia Type: General Level of consciousness: awake and alert Pain management: pain level controlled Vital Signs Assessment: post-procedure vital signs reviewed and stable Respiratory status: spontaneous breathing, nonlabored ventilation and respiratory function stable Cardiovascular status: blood pressure returned to baseline and stable Postop Assessment: no apparent nausea or vomiting Anesthetic complications: no    Last Vitals:  Vitals:   05/07/17 0942 05/07/17 1021  BP: (!) 134/98 (!) 139/92  Pulse: (!) 105 92  Resp: 18 18  Temp:  36.4 C  SpO2: 100% 100%    Last Pain:  Vitals:   05/07/17 1021  TempSrc:   PainSc: [place]

## 2017-05-07 NOTE — Anesthesia Procedure Notes (Signed)

## 2017-05-07 NOTE — Interval H&P Note (Signed)
History and Physical Interval Note:  05/07/2017 7:56 AM  Suzanne [name]  has presented today for surgery, with the diagnosis of lipoma  The various methods of treatment have been discussed with the patient and family. After consideration of risks, benefits and other options for treatment, the patient has consented to  Procedure(s): [place] (N/A) as a surgical intervention .  The patient's history has been reviewed, patient examined, no change in status, stable for surgery.  I have reviewed the patient's chart and labs.  Questions were answered to the patient's satisfaction.     [place]

## 2017-05-07 NOTE — Op Note (Signed)
Preoperative diagnosis: 5 cm x 3 cm lipoma posterior neck subcutaneous  Postoperative diagnosis: Same  Procedure: Excision of lipoma posterior neck  Surgeon: Erroll Luna MD  Anesthesia: General with local  EBL: 10 cc  Specimen: Lipoma  Drains: None  IV fluids: Per anesthesia record  Indications for procedure: The patient is a 27 year old female who presents for excision of a painful lipoma at the base of her neck and upper back region.  This is posterior.  Risk, benefits and other options of treatment and/or observation were discussed with the patient.  She agreed to proceed.The procedure has been discussed with the patient.  Alternative therapies have been discussed with the patient.  Operative risks include bleeding,  Infection,  Organ injury,  Nerve injury,  Blood vessel injury,  DVT,  Pulmonary embolism,  Death,  And possible reoperation.  Medical management risks include worsening of present situation.  The success of the procedure is 50 -90 % at treating patients symptoms.  The patient understands and agrees to proceed.  Description of procedure: The patient was met in the holding area and questions were answered.  The area was marked and the entire area was encircled that was to be prepped.  She was taken back to the operating room.  She was placed supine on the stretcher.  After induction of general anesthesia she was placed prone and padded appropriately.  The region on the posterior neck upper back were prepped and draped in sterile fashion.  Timeout was done.  She received antibiotic coverage.  Local anesthetic was infiltrated around the lipoma.  Transverse incision was made across the upper back approximately 5 cm.  The lipoma was excised it was subcutaneous and multiloculated.  He was taken in piecemeal fashion.  The area was irrigated but hemostatic.  The area was palpated I felt no more remnant of the lipoma that was felt preoperatively.  After ensuring hemostasis the wound was  closed with 0 Vicryl and 4-0 Monocryl.  Dermabond applied.  All final counts found to be correct.  The patient was then placed supine extubated taken to recovery in satisfactory condition.  All final counts were found to be correct.

## 2017-05-07 NOTE — Anesthesia Preprocedure Evaluation (Signed)
Anesthesia Evaluation  Patient identified by MRN, date of birth, ID band Patient awake    Reviewed: Allergy & Precautions, NPO status , Patient's Chart, lab work & pertinent test results  Airway Mallampati: II  TM Distance: >3 FB Neck ROM: Full    Dental no notable dental hx.    Pulmonary neg pulmonary ROS, Current Smoker,    Pulmonary exam normal breath sounds clear to auscultation       Cardiovascular negative cardio ROS Normal cardiovascular exam Rhythm:Regular Rate:Normal     Neuro/Psych Depression negative neurological ROS  negative psych ROS   GI/Hepatic negative GI ROS, Neg liver ROS,   Endo/Other  negative endocrine ROS  Renal/GU negative Renal ROS  negative genitourinary   Musculoskeletal negative musculoskeletal ROS (+)   Abdominal   Peds negative pediatric ROS (+)  Hematology negative hematology ROS (+)   Anesthesia Other Findings   Reproductive/Obstetrics negative OB ROS                             Anesthesia Physical Anesthesia Plan  ASA: II  Anesthesia Plan: General   Post-op Pain Management:    Induction: Intravenous  PONV Risk Score and Plan: 2 and Ondansetron and Midazolam  Airway Management Planned: Oral ETT  Additional Equipment:   Intra-op Plan:   Post-operative Plan: Extubation in OR  Informed Consent: I have reviewed the patients History and Physical, chart, labs and discussed the procedure including the risks, benefits and alternatives for the proposed anesthesia with the patient or authorized representative who has indicated his/her understanding and acceptance.   Dental advisory given  Plan Discussed with: CRNA  Anesthesia Plan Comments:         Anesthesia Quick Evaluation

## 2017-05-07 NOTE — Transfer of Care (Signed)
Immediate Anesthesia Transfer of Care Note  Patient: Suzanne [name]  Procedure(s) Performed: EXCISION LIPOMA POSTERIOR NECK ERAS PATHWAY (N/A Neck)  Patient Location: PACU  Anesthesia Type:General  Level of Consciousness: awake, alert  and oriented  Airway & Oxygen Therapy: Patient Spontanous Breathing and Patient connected to face mask oxygen  Post-op Assessment: Report given to RN and Post -op Vital signs reviewed and stable  Post vital signs: Reviewed and stable  Last Vitals:  Vitals Value Taken Time  BP 129/89 05/07/2017  9:20 AM  Temp    Pulse 116 05/07/2017  9:23 AM  Resp 23 05/07/2017  9:23 AM  SpO2 100 % 05/07/2017  9:23 AM  Vitals shown include unvalidated device data.  Last Pain:  Vitals:   05/07/17 0813  TempSrc: Oral  PainSc: 0-No pain      Patients Stated Pain Goal: 3 (50/56/97 9480)  Complications: No apparent anesthesia complications

## 2017-05-08 ENCOUNTER — Encounter (HOSPITAL_BASED_OUTPATIENT_CLINIC_OR_DEPARTMENT_OTHER): Payer: Self-pay | Admitting: Surgery

## 2017-05-09 ENCOUNTER — Encounter: Payer: Self-pay | Admitting: Internal Medicine

## 2017-05-09 ENCOUNTER — Ambulatory Visit: Payer: BLUE CROSS/BLUE SHIELD | Admitting: Internal Medicine

## 2017-05-09 VITALS — BP 140/100 | HR 110 | Ht 66.0 in | Wt 138.0 lb

## 2017-05-09 DIAGNOSIS — I456 Pre-excitation syndrome: Secondary | ICD-10-CM

## 2017-05-09 DIAGNOSIS — I471 Supraventricular tachycardia: Secondary | ICD-10-CM

## 2017-05-09 NOTE — Progress Notes (Signed)
HPI Ms. Mettler returns today for followup. 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 Curt Bears over a year ago. She has not had additional symptoms but was found to have recurrence of the antegrade conduction of her pathway and remains pre-excited. The patient has had problems with palpitations due to sinus tachycardia and episodes of dizziness. No frank syncope. She also c/o dyspnea and underwent cardiopulmonary stress testing which demonstrated that she was deconditioned. In the interim, she remains sedentary. She uses stimulants for her ADD. She smokes [place] and admits to drinking ETOH in excess.  No Known Allergies   Current Outpatient Medications  Medication Sig Dispense Refill  . dextroamphetamine (DEXEDRINE SPANSULE) 10 MG 24 hr capsule 30mg  qam and 20 mg q afternoon 150 capsule 0  . escitalopram (LEXAPRO) 20 MG tablet Take 1.5 tablets (30 mg total) by mouth daily. 135 tablet 0  . gabapentin (NEURONTIN) 400 MG capsule Take 1 capsule (400 mg total) by mouth at bedtime. 90 capsule 0  . HYDROcodone-acetaminophen (NORCO/VICODIN) 5-325 MG tablet Take 1 tablet by mouth every 6 (six) hours as needed for moderate pain. 10 tablet 0  . ibuprofen (ADVIL,MOTRIN) 800 MG tablet Take 1 tablet (800 mg total) by mouth every 8 (eight) hours as needed. 30 tablet 0  . medroxyPROGESTERone (DEPO-PROVERA) 150 MG/ML injection Inject 150 mg into the muscle every 3 (three) months.     Current Facility-Administered Medications  Medication Dose Route Frequency Provider Last Rate Last Dose  . medroxyPROGESTERone Acetate SUSY 150 mg  150 mg Intramuscular Q90 days Weber, Zohar Laing, PA-C   150 mg at 04/18/17 1711     Past Medical History:  Diagnosis Date  . 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 Lipoma of neck 04/2017   posterior  . Scoliosis    idiopathic   . WPW (Wolff-Parkinson-White syndrome)     ROS:   All systems reviewed and  negative except as noted in the HPI.   Past Surgical History:  Procedure Laterality Date  . COLPOSCOPY  01/2010 & 02/2011    both were negative   . ELECTROPHYSIOLOGIC STUDY N/A 05/17/2015   SVT ablation by Dr Curt Bears  . LIPOMA EXCISION N/A 05/07/2017   Procedure: EXCISION LIPOMA POSTERIOR NECK ERAS PATHWAY;  Surgeon: Erroll Luna, MD;  Location: [facility];  Service: General;  Laterality: N/A;  . WISDOM TOOTH EXTRACTION       Family History  Problem Relation Age of Onset  . Other Brother        substance abuse  . Hyperlipidemia Maternal Grandfather   . Anxiety disorder Maternal Grandfather   . Alcoholism Maternal Grandfather   . Anxiety disorder Mother   . ADD / ADHD Father   . Drug abuse Father   . ADD / ADHD Brother   . Other Brother        bone marrow transplant  . Breast cancer Paternal Grandmother   . Aplastic anemia Brother   . Hypertension Maternal Grandmother   . Alcoholism Maternal Grandmother   . Diabetes Paternal Grandfather   . Cancer Paternal Grandfather   . Arrhythmia Brother   . Alcoholism Other   . Alcoholism Maternal Uncle      Social History   Socioeconomic History  . Marital status: Single    Spouse name: Not on file  . Number of children: Not on file  . Years of education: Not on file  .  Highest education level: Not on file  Occupational History  . Not on file  Social Needs  . Financial resource strain: Not on file  . Food insecurity:    Worry: Not on file    Inability: Not on file  . Transportation needs:    Medical: Not on file    Non-medical: Not on file  Tobacco Use  . Smoking status: Current Every Day Smoker    Packs/day: 0.00    Years: 7.00    Pack years: 0.00    Types: Cigarettes  . Smokeless tobacco: Never Used  . Tobacco comment: 1 cig./day  Substance and Sexual Activity  . Alcohol use: Yes    Alcohol/week: 0.6 oz    Types: 1 Glasses of wine per week    Comment: 1 glass wine/day  . Drug use: Yes     Types: Marijuana    Comment: last used 04/13/2017  . Sexual activity: Not Currently    Partners: Male    Birth control/protection: None  Lifestyle  . Physical activity:    Days per week: Not on file    Minutes per session: Not on file  . Stress: Not on file  Relationships  . Social connections:    Talks on phone: Not on file    Gets together: Not on file    Attends religious service: Not on file    Active member of club or organization: Not on file    Attends meetings of clubs or organizations: Not on file    Relationship status: Not on file  . Intimate partner violence:    Fear of current or ex partner: Not on file    Emotionally abused: Not on file    Physically abused: Not on file    Forced sexual activity: Not on file  Other Topics Concern  . 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 - [employer]     BP (!) 140/100   Pulse (!) 110   Ht 5\' 6"  (1.676 m)   Wt 138 lb (62.6 kg)   SpO2 99%   BMI 22.27 kg/m   Physical Exam:  Well appearing 27 yo woman, NAD HEENT: Unremarkable Neck:  6 cm JVD, no thyromegally Lymphatics:  No adenopathy Back:  No CVA tenderness Lungs:  Clear with no wheezes HEART:  Regular tachy rhythm, no murmurs, no rubs, no clicks Abd:  soft, positive bowel sounds, no organomegally, no rebound, no guarding Ext:  2 plus pulses, no edema, no cyanosis, no clubbing Skin:  No rashes no nodules Neuro:  CN II through XII intact, motor grossly intact  EKG - sinus tachycardia with ventricular pre-excitation   Assess/Plan: 1. Palpitations - her symptoms appear to be due to sinus tachycardia. I have encouraged her to reduce her caffeine intake and to start exercising by walking daily.  2. ETOH use - she admits to some binge drinking and I have encouraged her to stop 3. Exertional dyspnea - review of her CPX reveals that she is deconditioned. I have asked her to start exercising. 4. WPW - she has  no evidence of recurrent SVT or atrial fib. We discussed the warning symptoms that she might experience if she had either. I will see her back as needed.  Mikle Bosworth.D.

## 2017-05-09 NOTE — Patient Instructions (Addendum)
Medication Instructions:  Your physician recommends that you continue on your current medications as directed. Please refer to the Current Medication list given to you today.  Labwork: None ordered.  Testing/Procedures: None ordered.  Follow-Up: Your physician wants you to follow-up in: as needed with Dr. Taylor.      Any Other Special Instructions Will Be Listed Below (If Applicable).  If you need a refill on your cardiac medications before your next appointment, please call your pharmacy.   

## 2017-05-30 ENCOUNTER — Telehealth: Payer: Self-pay | Admitting: Physician Assistant

## 2017-05-30 DIAGNOSIS — F988 Other specified behavioral and emotional disorders with onset usually occurring in childhood and adolescence: Secondary | ICD-10-CM

## 2017-05-30 NOTE — Telephone Encounter (Signed)
Last OV: 04/18/17 Last Fill: 04/18/17 PCP: [facility] Pharmacy: [facility] [place] [place] [address] [phone] (Phone) [phone] (Fax)

## 2017-05-30 NOTE — Telephone Encounter (Signed)
Refill  Request  Dexedrine 10mg /24  Hour  LOV 04/18/2017  Pharmacy on file

## 2017-05-30 NOTE — Telephone Encounter (Signed)
Copied from [place] [id number]. Topic: Quick Communication - Rx Refill/Question >> May 30, 2017  2:38 PM Percell Belt A wrote: Medication: dextroamphetamine (DEXEDRINE SPANSULE) 10 MG 24 hr capsule [[id number]]  Has the patient contacted their pharmacy? No  (Agent: If no, request that the patient contact the pharmacy for the refill.) Preferred Pharmacy (with phone number or street name): [facility] on [address] - pt loss her ins  Agent: Please be advised that RX refills may take up to 3 business days. We ask that you follow-up with your pharmacy.

## 2017-06-03 NOTE — Telephone Encounter (Signed)
Patient is requesting a refill of the following medications: Requested Prescriptions   Pending Prescriptions Disp Refills  . dextroamphetamine (DEXEDRINE SPANSULE) 10 MG 24 hr capsule 150 capsule 0    Sig: 30mg  qam and 20 mg q afternoon    Date of patient request: 05/30/2017 Last office visit: 05/09/2017 Date of last refill: 04/18/2017 Last refill amount: 150 Follow up time period per chart:

## 2017-06-05 MED ORDER — DEXTROAMPHETAMINE SULFATE ER 10 MG PO CP24: ORAL_CAPSULE | ORAL | 0 refills | Status: DC

## 2017-06-11 NOTE — Telephone Encounter (Signed)
Phone call to patient. Mailbox is full. Unable to leave message.   Phone call to [facility] on [address]. Prescription received 06/05/17. Patient needs to bring her insurance card and pick up medication.   If patient calls back, please relay the above message.

## 2017-06-11 NOTE — Telephone Encounter (Signed)
Relation to pt: self  Call back number:[phone]  Pharmacy: [facility], [address] [place] [phone] (Phone) [phone] (Fax)    Reason for call:  Patient checking on the status of prescription request mentioned below, patient states she's out of medication for the last 2 weeks and [place] going out the country  please advise

## 2017-07-25 ENCOUNTER — Ambulatory Visit: Payer: Self-pay | Admitting: Physician Assistant

## 2017-07-25 ENCOUNTER — Encounter: Payer: Self-pay | Admitting: Physician Assistant

## 2017-07-25 ENCOUNTER — Other Ambulatory Visit: Payer: Self-pay

## 2017-07-25 VITALS — BP 124/70 | HR 111 | Temp 98.7°F | Resp 18 | Ht 66.0 in | Wt 132.8 lb

## 2017-07-25 DIAGNOSIS — F321 Major depressive disorder, single episode, moderate: Secondary | ICD-10-CM

## 2017-07-25 DIAGNOSIS — R000 Tachycardia, unspecified: Secondary | ICD-10-CM

## 2017-07-25 DIAGNOSIS — F988 Other specified behavioral and emotional disorders with onset usually occurring in childhood and adolescence: Secondary | ICD-10-CM

## 2017-07-25 DIAGNOSIS — Z3042 Encounter for surveillance of injectable contraceptive: Secondary | ICD-10-CM

## 2017-07-25 MED ORDER — DEXTROAMPHETAMINE SULFATE 10 MG PO TABS: ORAL_TABLET | ORAL | 0 refills | Status: DC

## 2017-07-25 MED ORDER — ESCITALOPRAM OXALATE 20 MG PO TABS: 30.0000 mg | ORAL_TABLET | Freq: Every day | ORAL | 0 refills | Status: DC

## 2017-07-25 MED ORDER — DEXTROAMPHETAMINE SULFATE ER 10 MG PO CP24: ORAL_CAPSULE | ORAL | 0 refills | Status: DC

## 2017-07-25 MED ORDER — MEDROXYPROGESTERONE ACETATE 150 MG/ML IM SUSY: 150.0000 mg | PREFILLED_SYRINGE | Freq: Once | INTRAMUSCULAR | 0 refills | Status: DC

## 2017-07-25 MED ORDER — MEDROXYPROGESTERONE ACETATE 150 MG/ML IM SUSY: 150.0000 mg | PREFILLED_SYRINGE | Freq: Once | INTRAMUSCULAR | Status: DC

## 2017-07-25 MED ORDER — GABAPENTIN 400 MG PO CAPS
400.0000 mg | ORAL_CAPSULE | Freq: Every day | ORAL | 0 refills | Status: DC
Start: 2017-07-25 — End: 2017-07-25

## 2017-07-25 MED ORDER — ESCITALOPRAM OXALATE 20 MG PO TABS
30.0000 mg | ORAL_TABLET | Freq: Every day | ORAL | 0 refills | Status: DC
Start: 2017-07-25 — End: 2017-07-25

## 2017-07-25 MED ORDER — GABAPENTIN 400 MG PO CAPS: 400.0000 mg | ORAL_CAPSULE | Freq: Every day | ORAL | 0 refills | Status: DC

## 2017-07-25 NOTE — Addendum Note (Signed)
Addended by: Mancel Bale on: 07/25/2017 06:19 PM   Modules accepted: Orders

## 2017-07-25 NOTE — Patient Instructions (Signed)
     IF you received an x-ray today, you will receive an invoice from [facility]. Please contact [facility] at [phone] with questions or concerns regarding your invoice.   IF you received labwork today, you will receive an invoice from [facility]. Please contact [facility] at [phone] with questions or concerns regarding your invoice.   Our billing staff will not be able to assist you with questions regarding bills from these companies.  You will be contacted with the lab results as soon as they are available. The fastest way to get your results is to activate your My Chart account. Instructions are located on the last page of this paperwork. If you have not heard from us regarding the results in 2 weeks, please contact this office.     

## 2017-07-25 NOTE — Addendum Note (Signed)
Addended by: Mancel Bale on: 07/25/2017 06:21 PM   Modules accepted: Orders

## 2017-07-25 NOTE — Progress Notes (Signed)
Suzanne [name]  MRN: [id number] DOB: January 30, 1991  PCP: Mancel Bale, PA-C  Chief Complaint  Patient presents with  . Medication Refill    med check and maybe depo shot     Subjective:  Pt presents to clinic for medication refills. She has lost her insurance since her last visit.  She is doing well with her medications.  She would like to start getting her depoprovera at the pharmacy and getting an MD friend to give it to her.  She does not remember to take OCP.  She does not desire pregnancy.  She is sexually active.  History is obtained by patient.  Review of Systems  Constitutional: Negative for chills and fever.    Patient Active Problem List   Diagnosis Date Noted  . Irritability 03/29/2016  . Sinus tachycardia 08/07/2015  . Shortness of breath 08/07/2015  . SVT (supraventricular tachycardia) ([place]) 05/17/2015  . Major depression 05/03/2015  . Wolff-Parkinson-White syndrome 05/18/2014  . Smoker 05/18/2014  . ADD (attention deficit disorder) 08/18/2013    No current outpatient medications on file prior to visit.   Current Facility-Administered Medications on File Prior to Visit  Medication Dose Route Frequency Provider Last Rate Last Dose  . medroxyPROGESTERone Acetate SUSY 150 mg  150 mg Intramuscular Q90 days Weber, Sarah L, PA-C   150 mg at 04/18/17 1711    No Known Allergies  Past Medical History:  Diagnosis Date  . 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 Lipoma of neck 04/2017   posterior  . Scoliosis    idiopathic   . 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 Smoking status: Current Every Day Smoker    Packs/day: 0.00    Years: 7.00    Pack years: 0.00    Types: Cigarettes  . Smokeless tobacco: Never Used  . Tobacco comment: 1 cig./day  Substance Use Topics  . Alcohol use:  Yes    Alcohol/week: 0.6 oz    Types: 1 Glasses of wine per week    Comment: 1 glass wine/day  . Drug use: Yes    Types: Marijuana    Comment: last used 04/13/2017   family history includes ADD / ADHD in her brother and father; Alcoholism in her maternal grandfather, maternal grandmother, maternal uncle, and other; Anxiety disorder in her maternal grandfather and mother; Aplastic anemia in her brother; Arrhythmia in her brother; Breast cancer in her paternal grandmother; Cancer in her paternal grandfather; Diabetes in her paternal grandfather; Drug abuse in her father; Hyperlipidemia in her maternal grandfather; Hypertension in her maternal grandmother; Other in her brother and brother.     Objective:  BP 124/70   Pulse (!) 111   Temp 98.7 F (37.1 C) (Oral)   Resp 18   Ht 5\' 6"  (1.676 m)   Wt 132 lb 12.8 oz (60.2 kg)   SpO2 99%   BMI 21.43 kg/m  Body mass index is 21.43 kg/m.  Wt Readings from Last 3 Encounters:  07/25/17 132 lb 12.8 oz (60.2 kg)  05/09/17 138 lb (62.6 kg)  05/07/17 140 lb (63.5 kg)    Physical Exam  Constitutional: She is oriented to person, place, and time. She appears well-developed and well-nourished.  HENT:  Head: Normocephalic and atraumatic.  Right Ear: Hearing and external ear normal.  Left Ear: Hearing and external ear normal.  Eyes: Conjunctivae are normal.  Neck: Normal range of motion.  Cardiovascular: Normal rate, regular rhythm and normal heart sounds.  No murmur heard. Pulmonary/Chest: Effort normal and breath sounds normal. She has no wheezes.  Neurological: She is alert and oriented to person, place, and time.  Skin: Skin is warm and dry.  Psychiatric: She has a normal mood and affect. Her behavior is normal. Judgment and thought content normal.  Vitals reviewed.   Assessment and Plan :  Current moderate episode of major depressive disorder without prior episode ([place]) - Plan: gabapentin (NEURONTIN) 400 MG capsule, escitalopram  (LEXAPRO) 20 MG tablet - medication refills  Attention deficit disorder (ADD) without hyperactivity - Plan: dextroamphetamine (DEXEDRINE SPANSULE) 10 MG 24 hr capsule, dextroamphetamine (DEXEDRINE SPANSULE) 10 MG 24 hr capsule, dextroamphetamine (DEXEDRINE SPANSULE) 10 MG 24 hr capsule - medication refills  Sinus tachycardia - follows with a cardiologist  Encounter for surveillance of injectable contraceptive - Plan: medroxyPROGESTERone Acetate 150 MG/ML SUSY, DISCONTINUED: medroxyPROGESTERone Acetate SUSY 150 mg - pt will get a local pharmacy due to cost prohibitive to get in the clinic - she has an MD friend who will give it to her - if this friend is not willing she will bring the medication here for administration.  Windell Hummingbird PA-C  [facility] 07/25/2017 4:57 PM

## 2017-10-30 ENCOUNTER — Other Ambulatory Visit: Payer: Self-pay | Admitting: Physician Assistant

## 2017-10-30 DIAGNOSIS — F988 Other specified behavioral and emotional disorders with onset usually occurring in childhood and adolescence: Secondary | ICD-10-CM

## 2017-10-30 NOTE — Telephone Encounter (Signed)
Refill of Dextrostat  LOV 07/25/17 S. Weber  [facility] 07/25/17  #150  0 refills   [facility] [address] [place] [address] Phone: [phone] Fax: [phone]

## 2017-10-30 NOTE — Telephone Encounter (Signed)
Copied from [place] [id number]. Topic: Quick Communication - Rx Refill/Question >> Oct 30, 2017  5:18 PM Blase Mess A wrote: Medication: dextroamphetamine (DEXTROSTAT) 10 MG tablet [[id number]]   Has the patient contacted their pharmacy? Yes  (Agent: If no, request that the patient contact the pharmacy for the refill.) (Agent: If yes, when and what did the pharmacy advise?)  Preferred Pharmacy (with phone number or street name): [facility] [address] [place] [address] Phone: [phone] Fax: [phone]    Agent: Please be advised that RX refills may take up to 3 business days. We ask that you follow-up with your pharmacy.

## 2017-10-31 MED ORDER — DEXTROAMPHETAMINE SULFATE 10 MG PO TABS: ORAL_TABLET | ORAL | 0 refills | Status: DC

## 2017-10-31 NOTE — Telephone Encounter (Signed)
pmp and chart reviewed Med refilled Please schedule patient to establish care as patient's previous pcp in longer with this practice. thanks

## 2017-12-11 ENCOUNTER — Telehealth: Payer: Self-pay | Admitting: Family Medicine

## 2017-12-11 DIAGNOSIS — F321 Major depressive disorder, single episode, moderate: Secondary | ICD-10-CM

## 2017-12-11 NOTE — Telephone Encounter (Signed)
Copied from [place] [id number]. Topic: Quick Communication - Rx Refill/Question >> Dec 11, 2017  4:23 PM Waylan Rocher, [place] L wrote: Medication: dextroamphetamine (DEXTROSTAT) 10 MG tablet (See TE from 10/30/2017 as 1 month supply was sent in by [place] and patient was supposed to establish care with her but never did since Weber left. Patient says no one told her she needed to establish care. Please notify patient if she still needs an appointment)  Has the patient contacted their pharmacy? Yes.   (Agent: If no, request that the patient contact the pharmacy for the refill.) (Agent: If yes, when and what did the pharmacy advise?)  Preferred Pharmacy (with phone number or street name):  [facility] [address], [place] - [place] [address] Phone: [phone] Fax: [phone]  Agent: Please be advised that RX refills may take up to 3 business days. We ask that you follow-up with your pharmacy.

## 2017-12-18 MED ORDER — ESCITALOPRAM OXALATE 20 MG PO TABS: 30.0000 mg | ORAL_TABLET | Freq: Every day | ORAL | 0 refills | Status: DC

## 2017-12-18 NOTE — Telephone Encounter (Signed)
pmp reviewed Patient filled stimulant on 12/15/17 I refilled her lexapro today

## 2017-12-18 NOTE — Telephone Encounter (Signed)
Spoke with pt advised she will need TOC appt with [place] as she has not been seen since June 2019.  Pt has appt scheduled with santiago on 12/31/17 at 9 am.  Advised I would send message to refill dextrostat 10 mg and lexapro.   [facility]  Advised to keep appt and come on time for appt.  Advised pt message will be sent re: dextrostat/lexapro refills until appt.  Pt agreeable. [place]

## 2017-12-31 ENCOUNTER — Ambulatory Visit: Payer: Self-pay | Admitting: Family Medicine

## 2017-12-31 ENCOUNTER — Other Ambulatory Visit: Payer: Self-pay

## 2017-12-31 ENCOUNTER — Encounter: Payer: Self-pay | Admitting: Family Medicine

## 2017-12-31 VITALS — BP 138/90 | HR 100 | Temp 97.6°F | Ht 66.5 in | Wt 128.4 lb

## 2017-12-31 DIAGNOSIS — F988 Other specified behavioral and emotional disorders with onset usually occurring in childhood and adolescence: Secondary | ICD-10-CM

## 2017-12-31 DIAGNOSIS — Z3042 Encounter for surveillance of injectable contraceptive: Secondary | ICD-10-CM

## 2017-12-31 DIAGNOSIS — F321 Major depressive disorder, single episode, moderate: Secondary | ICD-10-CM

## 2017-12-31 DIAGNOSIS — Z79899 Other long term (current) drug therapy: Secondary | ICD-10-CM

## 2017-12-31 LAB — POCT URINE PREGNANCY: Preg Test, Ur: NEGATIVE

## 2017-12-31 MED ORDER — DEXTROAMPHETAMINE SULFATE 10 MG PO TABS: ORAL_TABLET | ORAL | 0 refills | Status: DC

## 2017-12-31 MED ORDER — GABAPENTIN 400 MG PO CAPS: 400.0000 mg | ORAL_CAPSULE | Freq: Every day | ORAL | 1 refills | Status: DC

## 2017-12-31 MED ORDER — MEDROXYPROGESTERONE ACETATE 150 MG/ML IM SUSY: 150.0000 mg | PREFILLED_SYRINGE | INTRAMUSCULAR | 3 refills | Status: DC

## 2017-12-31 MED ORDER — ESCITALOPRAM OXALATE 20 MG PO TABS: 30.0000 mg | ORAL_TABLET | Freq: Every day | ORAL | 1 refills | Status: DC

## 2017-12-31 NOTE — Progress Notes (Signed)
11/20/201910:09 AM  Suzanne [name] 25-Jun-1990, 27 y.o. female [id number]  Chief Complaint  Patient presents with  . Depression  . Medication Refill    adderrall, Lexapro    HPI:   Patient is a 27 y.o. female with past medical history significant for ADD and depression who presents today for routine followup  Previous PCP Weber, PA-C Last OV June 2019  Doing well on this regime: lexapro, gabapentin, dexotrostat Has never been hospitalized Does not do counseling Denies any side effects Sleep and appetite are good Works  Would like to get back on depo, self administers Stopped when she lost her insurance LMP spotting Last depo injection in March No h/o migraine, blood clots  DUI 9 years ago Has not had DL since then Finally eligible to get DL if she gets breathalyzer in her car Cant get machine to work Requesting medical letter She does smoke, no h/o childhood asthma   Fall Risk  07/25/2017 09/13/2016 05/28/2016 04/25/2016 03/29/2016  Falls in the past year? No No No No No     Depression screen [facility] 2/9 12/31/2017 07/25/2017 09/13/2016  Decreased Interest 1 0 0  Down, Depressed, Hopeless 1 0 0  PHQ - 2 Score 2 0 0  Altered sleeping 1 - -  Tired, decreased energy 1 - -  Change in appetite 0 - -  Feeling bad or failure about yourself  1 - -  Trouble concentrating 1 - -  Moving slowly or fidgety/restless 0 - -  Suicidal thoughts 0 - -  PHQ-9 Score 6 - -  Difficult doing work/chores Not difficult at all - -    No Known Allergies  Prior to Admission medications   Medication Sig Start Date End Date Taking? Authorizing Provider  dextroamphetamine (DEXTROSTAT) 10 MG tablet 3 po qam, 2 po qafternoon 10/31/17  Yes Rutherford Guys, MD  escitalopram (LEXAPRO) 20 MG tablet Take 1.5 tablets (30 mg total) by mouth daily. 12/18/17  Yes Rutherford Guys, MD  gabapentin (NEURONTIN) 400 MG capsule Take 1 capsule (400 mg total) by mouth at bedtime. 07/25/17  Yes Weber, Damaris Hippo, PA-C    medroxyPROGESTERone Acetate 150 MG/ML SUSY Inject 1 mL (150 mg total) into the muscle once for 1 dose. 07/25/17 07/25/17  Mancel Bale, PA-C    Past Medical History:  Diagnosis Date  . 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 Lipoma of neck 04/2017   posterior  . Scoliosis    idiopathic   . WPW (Wolff-Parkinson-White syndrome)     Past Surgical History:  Procedure Laterality Date  . COLPOSCOPY  01/2010 & 02/2011    both were negative   . ELECTROPHYSIOLOGIC STUDY N/A 05/17/2015   SVT ablation by Dr Curt Bears  . LIPOMA EXCISION N/A 05/07/2017   Procedure: EXCISION LIPOMA POSTERIOR NECK ERAS PATHWAY;  Surgeon: Erroll Luna, MD;  Location: [place];  Service: General;  Laterality: N/A;  . WISDOM TOOTH EXTRACTION      Social History   Tobacco Use  . Smoking status: Current Every Day Smoker    Packs/day: 0.00    Years: 7.00    Pack years: 0.00    Types: Cigarettes  . Smokeless tobacco: Never Used  . Tobacco comment: 1 cig./day  Substance Use Topics  . Alcohol use: Yes    Alcohol/week: 1.0 standard drinks    Types: 1 Glasses of wine per week    Comment: 1 glass wine/day    Family History  Problem  Relation Age of Onset  . Other Brother        substance abuse  . Hyperlipidemia Maternal Grandfather   . Anxiety disorder Maternal Grandfather   . Alcoholism Maternal Grandfather   . Anxiety disorder Mother   . ADD / ADHD Father   . Drug abuse Father   . ADD / ADHD Brother   . Other Brother        bone marrow transplant  . Breast cancer Paternal Grandmother   . Aplastic anemia Brother   . Hypertension Maternal Grandmother   . Alcoholism Maternal Grandmother   . Diabetes Paternal Grandfather   . Cancer Paternal Grandfather   . Arrhythmia Brother   . Alcoholism Other   . Alcoholism Maternal Uncle     ROS Per hpi  OBJECTIVE:  Blood pressure 138/90, pulse 100, temperature 97.6 F (36.4 C), temperature source Oral, height 5'  6.5" (1.689 m), weight 128 lb 6.4 oz (58.2 kg), SpO2 100 %. Body mass index is 20.41 kg/m.    Physical Exam  Constitutional: She is oriented to person, place, and time. She appears well-developed and well-nourished.  HENT:  Head: Normocephalic and atraumatic.  Mouth/Throat: Mucous membranes are normal.  Eyes: Pupils are equal, round, and reactive to light. Conjunctivae and EOM are normal. No scleral icterus.  Neck: Neck supple.  Pulmonary/Chest: Effort normal.  Neurological: She is alert and oriented to person, place, and time.  Skin: Skin is warm and dry.  Psychiatric: She has a normal mood and affect.  Nursing note and vitals reviewed.     Results for orders placed or performed in visit on 12/31/17 (from the past 24 hour(s))  POCT urine pregnancy     Status: None   Collection Time: 12/31/17 10:09 AM  Result Value Ref Range   Preg Test, Ur Negative Negative    ASSESSMENT and PLAN  1. Attention deficit disorder (ADD) without hyperactivity - dextroamphetamine (DEXTROSTAT) 10 MG tablet; 3 po qam, 2 po qafternoon  2. Medication management - ToxASSURE Select 13 (MW), Urine - Care order/instruction:  3. Encounter for Depo-Provera contraception - POCT urine pregnancy  4. Current moderate episode of major depressive disorder without prior episode ([facility]) - escitalopram (LEXAPRO) 20 MG tablet; Take 1.5 tablets (30 mg total) by mouth daily. - gabapentin (NEURONTIN) 400 MG capsule; Take 1 capsule (400 mg total) by mouth at bedtime.  5. Encounter for surveillance of injectable contraceptive - medroxyPROGESTERone Acetate 150 MG/ML SUSY; Inject 1 mL (150 mg total) into the muscle every 3 (three) months.  Other orders - dextroamphetamine (DEXTROSTAT) 10 MG tablet; Take 3 tablets (30 mg total) by mouth daily before breakfast AND 2 tablets (20 mg total) daily after lunch. - dextroamphetamine (DEXTROSTAT) 10 MG tablet; Take 3 tablets (30 mg total) by mouth daily before breakfast AND 2  tablets (20 mg total) daily after lunch.  Overall stable on current regime. meds refilled. She will bring in court paperwork related to [place]  Return in about 6 months (around 07/01/2018) for meds mgt.    Rutherford Guys, MD Primary Care at [facility] [place], [place] [address] Ph.  [phone] Fax [phone]

## 2017-12-31 NOTE — Patient Instructions (Signed)
° ° ° °  If you have lab work done today you will be contacted with your lab results within the next 2 weeks.  If you have not heard from us then please contact us. The fastest way to get your results is to register for My Chart. ° ° °IF you received an x-ray today, you will receive an invoice from [facility]. Please contact  Radiology at [phone] with questions or concerns regarding your invoice.  ° °IF you received labwork today, you will receive an invoice from [facility]. Please contact [facility] at [phone] with questions or concerns regarding your invoice.  ° °Our billing staff will not be able to assist you with questions regarding bills from these companies. ° °You will be contacted with the lab results as soon as they are available. The fastest way to get your results is to activate your My Chart account. Instructions are located on the last page of this paperwork. If you have not heard from us regarding the results in 2 weeks, please contact this office. °  ° ° ° °

## 2018-01-05 LAB — TOXASSURE SELECT 13 (MW), URINE

## 2018-03-23 ENCOUNTER — Other Ambulatory Visit: Payer: Self-pay | Admitting: Family Medicine

## 2018-03-23 DIAGNOSIS — Z3042 Encounter for surveillance of injectable contraceptive: Secondary | ICD-10-CM

## 2018-03-23 NOTE — Telephone Encounter (Signed)
Please review for refills for dextroamphetamine 10 mg tab (LR 03/02/18)  and medroxyprogesterone injection 150 mg (LR 12/31/17). LOV  12/31/17 NOV  06/30/2018 Dr. Pamella Pert

## 2018-03-23 NOTE — Telephone Encounter (Signed)
Please Advise Patient is requesting a refill of the following medications: Requested Prescriptions   Pending Prescriptions Disp Refills  . dextroamphetamine (DEXTROSTAT) 10 MG tablet 150 tablet 0    Sig: Take 3 tablets (30 mg total) by mouth daily before breakfast AND 2 tablets (20 mg total) daily after lunch. Do all this for 30 days.  . medroxyPROGESTERone Acetate 150 MG/ML SUSY 1 mL 3    Sig: Inject 1 mL (150 mg total) into the muscle every 3 (three) months.

## 2018-03-23 NOTE — Telephone Encounter (Signed)
Copied from [place] [id number]. Topic: Quick Communication - Rx Refill/Question >> Mar 23, 2018 12:57 PM Margot Ables wrote: Medication: medroxyPROGESTERone (DEPO-PROVERA) injection 150 mg  - due 2/25 dextroamphetamine (DEXTROSTAT) 10 MG tablet - pt has 3-4 days left but said she's fine - please send when due  Has the patient contacted their pharmacy? No - controlled Preferred Pharmacy (with phone number or street name): [facility] [address] [place] [address] Phone: [phone] Fax: [phone]

## 2018-03-25 MED ORDER — MEDROXYPROGESTERONE ACETATE 150 MG/ML IM SUSY: 150.0000 mg | PREFILLED_SYRINGE | INTRAMUSCULAR | 3 refills | Status: DC

## 2018-04-12 ENCOUNTER — Other Ambulatory Visit: Payer: Self-pay

## 2018-04-12 ENCOUNTER — Encounter (HOSPITAL_COMMUNITY): Payer: Self-pay | Admitting: Emergency Medicine

## 2018-04-12 ENCOUNTER — Emergency Department (HOSPITAL_COMMUNITY)
Admission: EM | Admit: 2018-04-12 | Discharge: 2018-04-12 | Disposition: A | Discharge status: Home / Self Care | Payer: Self-pay | Attending: Emergency Medicine | Admitting: Emergency Medicine

## 2018-04-12 ENCOUNTER — Emergency Department (HOSPITAL_COMMUNITY): Payer: Self-pay

## 2018-04-12 DIAGNOSIS — Y999 Unspecified external cause status: Secondary | ICD-10-CM | POA: Insufficient documentation

## 2018-04-12 DIAGNOSIS — Y9389 Activity, other specified: Secondary | ICD-10-CM | POA: Insufficient documentation

## 2018-04-12 DIAGNOSIS — Y929 Unspecified place or not applicable: Secondary | ICD-10-CM | POA: Insufficient documentation

## 2018-04-12 DIAGNOSIS — Z79899 Other long term (current) drug therapy: Secondary | ICD-10-CM | POA: Insufficient documentation

## 2018-04-12 DIAGNOSIS — F17210 Nicotine dependence, cigarettes, uncomplicated: Secondary | ICD-10-CM | POA: Insufficient documentation

## 2018-04-12 DIAGNOSIS — S51812A Laceration without foreign body of left forearm, initial encounter: Secondary | ICD-10-CM | POA: Insufficient documentation

## 2018-04-12 DIAGNOSIS — W25XXXA Contact with sharp glass, initial encounter: Secondary | ICD-10-CM | POA: Insufficient documentation

## 2018-04-12 MED ORDER — LIDOCAINE-EPINEPHRINE-TETRACAINE (LET) SOLUTION
3.0000 mL | Freq: Once | NASAL | Status: DC
  Filled 2018-04-12: qty 3

## 2018-04-12 MED ORDER — IBUPROFEN 800 MG PO TABS
800.0000 mg | ORAL_TABLET | Freq: Once | ORAL | Status: AC
  Administered 2018-04-12: 800 mg via ORAL
  Filled 2018-04-12: qty 1

## 2018-04-12 MED ORDER — LIDOCAINE-EPINEPHRINE (PF) 2 %-1:200000 IJ SOLN
10.0000 mL | Freq: Once | INTRAMUSCULAR | Status: AC
  Administered 2018-04-12: 10 mL
  Filled 2018-04-12: qty 20

## 2018-04-12 NOTE — ED Provider Notes (Signed)
[facility] DEPT Provider Note   CSN: [id number] Arrival date & time: 04/12/18  0326    History   Chief Complaint Chief Complaint  Patient presents with  . Extremity Laceration    HPI Suzanne [name] is a 28 y.o. female.    28 year old female presents to the emergency department for evaluation of laceration.  She states that she cut her arm on a glass desk while falling.  Notes that the desk subsequently shattered.  Bleeding controlled prior to arrival with pressure.  She has been experiencing some constant discomfort to the area of injury.  No medications taken prior to arrival.  States that she is up-to-date on her tetanus shot which was last received approximately 2 years ago.  No associated numbness or paresthesias.  She is not on chronic anticoagulation.  The history is provided by the patient. No language interpreter was used.    Past Medical History:  Diagnosis Date  . 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 Depression   . Lipoma of neck 04/2017   posterior  . Scoliosis    idiopathic   . WPW (Wolff-Parkinson-White syndrome)     Patient Active Problem List   Diagnosis Date Noted  . Irritability 03/29/2016  . Sinus tachycardia 08/07/2015  . Shortness of breath 08/07/2015  . SVT (supraventricular tachycardia) ([place]) 05/17/2015  . Major depression 05/03/2015  . Wolff-Parkinson-White syndrome 05/18/2014  . Smoker 05/18/2014  . ADD (attention deficit disorder) 08/18/2013    Past Surgical History:  Procedure Laterality Date  . COLPOSCOPY  01/2010 & 02/2011    both were negative   . ELECTROPHYSIOLOGIC STUDY N/A 05/17/2015   SVT ablation by Dr Curt Bears  . LIPOMA EXCISION N/A 05/07/2017   Procedure: EXCISION LIPOMA POSTERIOR NECK ERAS PATHWAY;  Surgeon: Erroll Luna, MD;  Location: [facility];  Service: General;  Laterality: N/A;  . WISDOM TOOTH EXTRACTION       OB History    Gravida  0   Para      Term      Preterm      AB      Living        SAB      TAB      Ectopic      Multiple      Live Births               Home Medications    Prior to Admission medications   Medication Sig Start Date End Date Taking? Authorizing Provider  dextroamphetamine (DEXTROSTAT) 10 MG tablet 3 po qam, 2 po qafternoon 12/31/17   Rutherford Guys, MD  dextroamphetamine (DEXTROSTAT) 10 MG tablet Take 3 tablets (30 mg total) by mouth daily before breakfast AND 2 tablets (20 mg total) daily after lunch. 01/30/18 03/01/18  Rutherford Guys, MD  dextroamphetamine (DEXTROSTAT) 10 MG tablet Take 3 tablets (30 mg total) by mouth daily before breakfast AND 2 tablets (20 mg total) daily after lunch. 03/02/18 04/01/18  Rutherford Guys, MD  escitalopram (LEXAPRO) 20 MG tablet Take 1.5 tablets (30 mg total) by mouth daily. 12/31/17   Rutherford Guys, MD  gabapentin (NEURONTIN) 400 MG capsule Take 1 capsule (400 mg total) by mouth at bedtime. 12/31/17   Rutherford Guys, MD  medroxyPROGESTERone Acetate 150 MG/ML SUSY Inject 1 mL (150 mg total) into the muscle every 3 (three) months. 03/25/18   Rutherford Guys, MD    Family History Family History  Problem Relation Age of Onset  . Other Brother        substance abuse  . Hyperlipidemia Maternal Grandfather   . Anxiety disorder Maternal Grandfather   . Alcoholism Maternal Grandfather   . Anxiety disorder Mother   . ADD / ADHD Father   . Drug abuse Father   . ADD / ADHD Brother   . Other Brother        bone marrow transplant  . Breast cancer Paternal Grandmother   . Aplastic anemia Brother   . Hypertension Maternal Grandmother   . Alcoholism Maternal Grandmother   . Diabetes Paternal Grandfather   . Cancer Paternal Grandfather   . Arrhythmia Brother   . Alcoholism Other   . Alcoholism Maternal Uncle     Social History Social History   Tobacco Use  . Smoking status: Current Every Day Smoker    Packs/day: 0.00    Years: 7.00    Pack  years: 0.00    Types: Cigarettes  . Smokeless tobacco: Never Used  . Tobacco comment: 1 cig./day  Substance Use Topics  . Alcohol use: Yes    Alcohol/week: 1.0 standard drinks    Types: 1 Glasses of wine per week    Comment: 1 glass wine/day  . Drug use: Yes    Types: Marijuana    Comment: last used 04/13/2017     Allergies   Patient has no known allergies.   Review of Systems Review of Systems Ten systems reviewed and are negative for acute change, except as noted in the HPI.    Physical Exam Updated Vital Signs BP 139/84   Pulse 99   Temp 98 F (36.7 C)   Resp 16   Ht 5\' 6"  (1.676 m)   Wt 56.7 kg   LMP 04/11/2018   SpO2 100%   BMI 20.18 kg/m   Physical Exam Vitals signs and nursing note reviewed.  Constitutional:      General: She is not in acute distress.    Appearance: She is well-developed. She is not diaphoretic.     Comments: Nontoxic appearing and in NAD  HENT:     Head: Normocephalic and atraumatic.  Eyes:     General: No scleral icterus.    Conjunctiva/sclera: Conjunctivae normal.  Neck:     Musculoskeletal: Normal range of motion.  Cardiovascular:     Rate and Rhythm: Normal rate and regular rhythm.     Pulses: Normal pulses.     Comments: Distal radial pulse 2+ in the LUE Pulmonary:     Effort: Pulmonary effort is normal. No respiratory distress.     Comments: Respirations even and unlabored Musculoskeletal: Normal range of motion.     Left forearm: She exhibits laceration.       Arms:     Comments: Normal ROM of the LUE  Skin:    General: Skin is warm and dry.     Coloration: Skin is not pale.     Findings: No erythema or rash.     Comments: 10cm linear laceration to left volar forearm. Bleeding controlled.   Neurological:     Mental Status: She is alert and oriented to person, place, and time.  Psychiatric:        Behavior: Behavior normal.      ED Treatments / Results  Labs (all labs ordered are listed, but only abnormal  results are displayed) Labs Reviewed - No data to display  EKG None  Radiology Dg Forearm Left  Result  Date: 04/12/2018 CLINICAL DATA:  28 year old female with laceration of the forearm with glass. EXAM: LEFT FOREARM - 2 VIEW COMPARISON:  None. FINDINGS: There is no acute fracture or dislocation. The bones are well mineralized. No arthritic changes. No joint effusion. Laceration of the volar aspect of the forearm. No radiopaque foreign object. IMPRESSION: Negative. Electronically Signed   By: Anner Crete M.D.   On: 04/12/2018 04:13    Procedures Procedures (including critical care time)  LACERATION REPAIR Performed by: Antonietta Breach Authorized by: Antonietta Breach Consent: Verbal consent obtained. Risks and benefits: risks, benefits and alternatives were discussed Consent given by: patient Patient identity confirmed: provided demographic data Prepped and Draped in normal sterile fashion Wound explored  Laceration Location: volar L forearm  Laceration Length: 10cm  No Foreign Bodies seen or palpated  Anesthesia: local infiltration  Local anesthetic: lidocaine 2% with epinephrine  Anesthetic total: 14 ml  Irrigation method: syringe Amount of cleaning: standard  Skin closure: 5-0 prolene  Number of sutures: 8  Technique: simple interrupted (3), horizontal mattress (5)  Patient tolerance: Patient tolerated the procedure well with no immediate complications.   LACERATION REPAIR Performed by: Antonietta Breach Authorized by: Antonietta Breach Consent: Verbal consent obtained. Risks and benefits: risks, benefits and alternatives were discussed Consent given by: patient Patient identity confirmed: provided demographic data Prepped and Draped in normal sterile fashion Wound explored  Laceration Location: volar L forearm  Laceration Length: 10cm  No Foreign Bodies seen or palpated  Anesthesia: local infiltration  Local anesthetic: lidocaine 2% with epinephrine  Anesthetic  total: 14 ml  Irrigation method: syringe Amount of cleaning: standard  Skin closure: 4-0 chromic  Number of sutures: 3  Technique: subcutaneous   Patient tolerance: Patient tolerated the procedure well with no immediate complications.   Medications Ordered in ED Medications  lidocaine-EPINEPHrine-tetracaine (LET) solution (3 mLs Topical Canceled Entry 04/12/18 0346)  lidocaine-EPINEPHrine (XYLOCAINE W/EPI) 2 %-1:200000 (PF) injection 10 mL (10 mLs Infiltration Given 04/12/18 0415)  ibuprofen (ADVIL,MOTRIN) tablet 800 mg (800 mg Oral Given 04/12/18 0415)     Initial Impression / Assessment and Plan / ED Course  I have reviewed the triage vital signs and the nursing notes.  Pertinent labs & imaging results that were available during my care of the patient were reviewed by me and considered in my medical decision making (see chart for details).        Tdap booster UTD. Pressure irrigation performed. Laceration occurred < 8 hours prior to repair which was well tolerated. Pt has no comorbidities to effect normal wound healing. Discussed suture home care with pt and answered questions. Pt to follow up for wound check and suture removal in 12-14 days. Pt is hemodynamically stable with no complaints prior to discharge.     Final Clinical Impressions(s) / ED Diagnoses   Final diagnoses:  Laceration of left forearm, initial encounter    ED Discharge Orders    None       Antonietta Breach, PA-C 04/12/18 0510    Molpus, Jenny Reichmann, MD 04/12/18 [phone]

## 2018-04-12 NOTE — ED Triage Notes (Signed)
Patient states she was cut with some glass. Patient has a deep laceration to lower inner forearm. Patient got cut 30 min ago.

## 2018-04-12 NOTE — Discharge Instructions (Signed)
We recommend application of topical bacitracin twice a day.  Change your dressing at least every 24 hours to keep the area clean and dry.  Take 600 mg ibuprofen every 6 hours as needed for pain or soreness.  You may take this with Tylenol as needed for additional pain control.  Keep the area clean with mild soap and warm water.  Do not scrub the area.  Have your primary care doctor remove your stitches in 12 to 14 days.  You may return to the emergency department for new or concerning symptoms or if signs of infection develop.

## 2018-04-21 ENCOUNTER — Encounter

## 2018-04-21 ENCOUNTER — Ambulatory Visit: Payer: Self-pay | Admitting: Family Medicine

## 2018-05-07 ENCOUNTER — Telehealth: Payer: Self-pay | Admitting: Family Medicine

## 2018-05-07 NOTE — Telephone Encounter (Signed)
Copied from [place] [id number]. Topic: Quick Communication - Rx Refill/Question >> May 07, 2018  2:52 PM Richardo Priest, NT wrote: Medication: dextroamphetamine (DEXTROSTAT) 10 MG tablet  Has the patient contacted their pharmacy? Yes, patient states she no longer has any refills.  Preferred Pharmacy (with phone number or street name):  [facility] [address], [place] - [place] [phone] (Phone) [phone] (Fax)  Agent: Please be advised that RX refills may take up to 3 business days. We ask that you follow-up with your pharmacy.

## 2018-05-07 NOTE — Telephone Encounter (Signed)
Please advise 

## 2018-05-09 NOTE — Telephone Encounter (Signed)
As stated with last previous refill requests, her last UDS was positive for cocaine. I will not refill her medications. She should see a specialists in ADD. I am happy to make a referral. Thanks

## 2018-05-09 NOTE — Telephone Encounter (Signed)
Pt has been informed.

## 2018-05-19 NOTE — Telephone Encounter (Signed)
Pt's mom called to request refill for dextroamphetamine (DEXTROSTAT) 10 MG tablet Mom put Pt on the phone and I advised Pt of the note left and that [place] informed her. Pt states she would like to come in and take UDS over or if she needs appt. Could not reach office due to office phone not working / please advise

## 2018-05-22 NOTE — Telephone Encounter (Signed)
patient needs tele med.

## 2018-05-25 ENCOUNTER — Telehealth: Payer: Self-pay | Admitting: Family Medicine

## 2018-05-25 DIAGNOSIS — F17200 Nicotine dependence, unspecified, uncomplicated: Secondary | ICD-10-CM

## 2018-05-25 NOTE — Telephone Encounter (Signed)
Copied from [place] [id number]. Topic: Quick Communication - See Telephone Encounter >> May 25, 2018  4:56 PM Valla Leaver wrote: CRM for notification. See Telephone encounter for: 05/25/18. Patients dad, Thurmond Butts wants to know if Dr. Pamella Pert can order pulmonary function test so the patient can get a breathalizer in her vehicle?

## 2018-05-26 NOTE — Telephone Encounter (Signed)
Please see note below. 

## 2018-05-26 NOTE — Telephone Encounter (Signed)
Referral made 

## 2018-05-28 ENCOUNTER — Other Ambulatory Visit: Payer: Self-pay

## 2018-05-28 ENCOUNTER — Telehealth (INDEPENDENT_AMBULATORY_CARE_PROVIDER_SITE_OTHER): Payer: Self-pay | Admitting: Family Medicine

## 2018-05-28 DIAGNOSIS — F1911 Other psychoactive substance abuse, in remission: Secondary | ICD-10-CM

## 2018-05-28 DIAGNOSIS — F321 Major depressive disorder, single episode, moderate: Secondary | ICD-10-CM

## 2018-05-28 DIAGNOSIS — F988 Other specified behavioral and emotional disorders with onset usually occurring in childhood and adolescence: Secondary | ICD-10-CM

## 2018-05-28 DIAGNOSIS — I456 Pre-excitation syndrome: Secondary | ICD-10-CM

## 2018-05-28 NOTE — Progress Notes (Signed)
Chief Complaint:   Pt would like med refill Dextroamphetamine. Pending medication  PHQ9 Score 3 and GAD 7 score 2   GAD 7 Questionnaire Over the last 2 weeks, how often have you been bothered by the following problems?   Not at all sure = 0   Several days = 1   Over half the days =2   Nearly every day =3  1.  Feeling nervous, anxious, or on edge - 0 2.  Not being able to stop or control worrying -0 3.  Worrying too much about different things -1 4.  Trouble relaxing -0 5.  Being so restless that it's hard to sit still - 0 6.  Becoming easily annoyed or irritable - 1 7.  Feeling afraid as if something awful might happen - 0 Total Score (add your column scores) =           If you checked off any problems, how difficult have these made it for you to do your work, take care of things at home, or get along with other people? - Somewhat difficult

## 2018-05-28 NOTE — Progress Notes (Signed)
Virtual Visit via telephone Note  I connected with patient on 05/28/18 at 1036 am by telephone and verified that I am speaking with the correct person using two identifiers. Suzanne [name] is currently located at home and patient is currently with her during visit. The provider, Rutherford Guys, MD is located in their office at time of visit.  I discussed the limitations, risks, security and privacy concerns of performing an evaluation and management service by telephone and the availability of in person appointments. I also discussed with the patient that there may be a patient responsible charge related to this service. The patient expressed understanding and agreed to proceed.  Telephone visit today for medication refill  HPI Last OV Nov 2019 ADD, depression, anxiety, WPW syndrome, remote DUI Last UDS + cocaine and THC Patient did not receive mychart message She denies cocaine use, "someone must put something in my drink" "I used to go to bars a lot, I don't go now" She reports occ THC use Last med refilled per pmp in feb?  Fall Risk  05/28/2018 07/25/2017 09/13/2016 05/28/2016 04/25/2016  Falls in the past year? 0 No No No No  Number falls in past yr: 0 - - - -  Injury with Fall? 0 - - - -  Follow up Falls evaluation completed - - - -     Depression screen [facility] 2/9 05/28/2018 12/31/2017 07/25/2017  Decreased Interest 1 1 0  Down, Depressed, Hopeless 0 1 0  PHQ - 2 Score 1 2 0  Altered sleeping 0 1 -  Tired, decreased energy 1 1 -  Change in appetite 0 0 -  Feeling bad or failure about yourself  0 1 -  Trouble concentrating 1 1 -  Moving slowly or fidgety/restless 0 0 -  Suicidal thoughts 0 0 -  PHQ-9 Score 3 6 -  Difficult doing work/chores Not difficult at all Not difficult at all -    No Known Allergies  Prior to Admission medications   Medication Sig Start Date End Date Taking? Authorizing Provider  dextroamphetamine (DEXTROSTAT) 10 MG tablet 3 po qam, 2 po qafternoon  12/31/17  Yes Rutherford Guys, MD  escitalopram (LEXAPRO) 20 MG tablet Take 1.5 tablets (30 mg total) by mouth daily. 12/31/17  Yes Rutherford Guys, MD  gabapentin (NEURONTIN) 400 MG capsule Take 1 capsule (400 mg total) by mouth at bedtime. 12/31/17  Yes Rutherford Guys, MD  dextroamphetamine (DEXTROSTAT) 10 MG tablet Take 3 tablets (30 mg total) by mouth daily before breakfast AND 2 tablets (20 mg total) daily after lunch. 01/30/18 03/01/18  Rutherford Guys, MD  dextroamphetamine (DEXTROSTAT) 10 MG tablet Take 3 tablets (30 mg total) by mouth daily before breakfast AND 2 tablets (20 mg total) daily after lunch. 03/02/18 04/01/18  Rutherford Guys, MD  medroxyPROGESTERone Acetate 150 MG/ML SUSY Inject 1 mL (150 mg total) into the muscle every 3 (three) months. Patient not taking: Reported on 05/28/2018 03/25/18   Rutherford Guys, MD    Past Medical History:  Diagnosis Date  . 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 Depression   . Lipoma of neck 04/2017   posterior  . Scoliosis    idiopathic   . WPW (Wolff-Parkinson-White syndrome)     Past Surgical History:  Procedure Laterality Date  . COLPOSCOPY  01/2010 & 02/2011    both were negative   . ELECTROPHYSIOLOGIC STUDY N/A 05/17/2015   SVT ablation by Dr Curt Bears  .  LIPOMA EXCISION N/A 05/07/2017   Procedure: EXCISION LIPOMA POSTERIOR NECK ERAS PATHWAY;  Surgeon: Erroll Luna, MD;  Location: [facility];  Service: General;  Laterality: N/A;  . WISDOM TOOTH EXTRACTION      Social History   Tobacco Use  . Smoking status: Current Every Day Smoker    Packs/day: 0.00    Years: 7.00    Pack years: 0.00    Types: Cigarettes  . Smokeless tobacco: Never Used  . Tobacco comment: 1 cig./day  Substance Use Topics  . Alcohol use: Yes    Alcohol/week: 1.0 standard drinks    Types: 1 Glasses of wine per week    Comment: 1 glass wine/day    Family History  Problem Relation Age of Onset  . Other  Brother        substance abuse  . Hyperlipidemia Maternal Grandfather   . Anxiety disorder Maternal Grandfather   . Alcoholism Maternal Grandfather   . Anxiety disorder Mother   . ADD / ADHD Father   . Drug abuse Father   . ADD / ADHD Brother   . Other Brother        bone marrow transplant  . Breast cancer Paternal Grandmother   . Aplastic anemia Brother   . Hypertension Maternal Grandmother   . Alcoholism Maternal Grandmother   . Diabetes Paternal Grandfather   . Cancer Paternal Grandfather   . Arrhythmia Brother   . Alcoholism Other   . Alcoholism Maternal Uncle     ROS Per hpi  Objective  Vitals as reported by the patient: none  There were no vitals filed for this visit.  ASSESSMENT and PLAN  1. Attention deficit disorder (ADD) without hyperactivity 2. Current moderate episode of major depressive disorder without prior episode ([place]) 3. Wolff-Parkinson-White syndrome 4. History of substance abuse [facility]) - Ambulatory referral to Psychiatry  Discussed with patient that given co-morbidities with substance use psychiatric care is more appropriate. Referral made. Medications not refilled.   FOLLOW-UP: prn   The above assessment and management plan was discussed with the patient. The patient verbalized understanding of and has agreed to the management plan. Patient is aware to call the clinic if symptoms persist or worsen. Patient is aware when to return to the clinic for a follow-up visit. Patient educated on when it is appropriate to go to the emergency department.    I provided 17 minutes of non-face-to-face time during this encounter.  Rutherford Guys, MD [facility] at [facility] [place], [place] [address] Ph.  [phone] Fax [phone]

## 2018-06-27 ENCOUNTER — Other Ambulatory Visit: Payer: Self-pay

## 2018-06-27 DIAGNOSIS — S0101XA Laceration without foreign body of scalp, initial encounter: Secondary | ICD-10-CM | POA: Insufficient documentation

## 2018-06-27 DIAGNOSIS — F17210 Nicotine dependence, cigarettes, uncomplicated: Secondary | ICD-10-CM | POA: Insufficient documentation

## 2018-06-27 DIAGNOSIS — Y9389 Activity, other specified: Secondary | ICD-10-CM | POA: Insufficient documentation

## 2018-06-27 DIAGNOSIS — Z79899 Other long term (current) drug therapy: Secondary | ICD-10-CM | POA: Insufficient documentation

## 2018-06-27 DIAGNOSIS — Y999 Unspecified external cause status: Secondary | ICD-10-CM | POA: Insufficient documentation

## 2018-06-27 DIAGNOSIS — Y929 Unspecified place or not applicable: Secondary | ICD-10-CM | POA: Insufficient documentation

## 2018-06-28 ENCOUNTER — Emergency Department (HOSPITAL_COMMUNITY): Payer: Self-pay

## 2018-06-28 ENCOUNTER — Emergency Department (HOSPITAL_COMMUNITY)
Admission: EM | Admit: 2018-06-28 | Discharge: 2018-06-28 | Disposition: A | Discharge status: Home / Self Care | Payer: Self-pay | Attending: Emergency Medicine | Admitting: Emergency Medicine

## 2018-06-28 ENCOUNTER — Encounter (HOSPITAL_COMMUNITY): Payer: Self-pay | Admitting: Emergency Medicine

## 2018-06-28 ENCOUNTER — Other Ambulatory Visit: Payer: Self-pay

## 2018-06-28 DIAGNOSIS — S0101XA Laceration without foreign body of scalp, initial encounter: Secondary | ICD-10-CM

## 2018-06-28 MED ORDER — LIDOCAINE-EPINEPHRINE (PF) 2 %-1:200000 IJ SOLN
20.0000 mL | Freq: Once | INTRAMUSCULAR | Status: AC
  Administered 2018-06-28: 20 mL

## 2018-06-28 MED ORDER — IBUPROFEN 800 MG PO TABS
800.0000 mg | ORAL_TABLET | Freq: Once | ORAL | Status: AC
  Administered 2018-06-28: 800 mg via ORAL
  Filled 2018-06-28: qty 1
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
## 2018-06-28 NOTE — ED Notes (Signed)
Bed: WTR9 Expected date:  Expected time:  Means of arrival:  Comments: 
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
## 2018-06-30 ENCOUNTER — Ambulatory Visit: Payer: Self-pay | Admitting: Family Medicine

## 2018-09-09 ENCOUNTER — Institutional Professional Consult (permissible substitution): Payer: Self-pay | Admitting: Pulmonary Disease

## 2018-09-10 ENCOUNTER — Institutional Professional Consult (permissible substitution): Payer: Self-pay | Admitting: Pulmonary Disease

## 2018-09-10 NOTE — Progress Notes (Deleted)
Subjective:   PATIENT ID: Suzanne [name] GENDER: female DOB: 1990-10-07, MRN: [id number]   HPI  No chief complaint on file.   Reason for Visit: ***Follow-up ***New consult for ***  *** Social History:  Environmental exposures: ***  I have personally reviewed patient's past medical/family/social history, allergies, current medications.***  Past Medical History:  Diagnosis Date  . 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 Depression   . Lipoma of neck 04/2017   posterior  . Scoliosis    idiopathic   . WPW (Wolff-Parkinson-White syndrome)      Family History  Problem Relation Age of Onset  . Other Brother        substance abuse  . Hyperlipidemia Maternal Grandfather   . Anxiety disorder Maternal Grandfather   . Alcoholism Maternal Grandfather   . Anxiety disorder Mother   . ADD / ADHD Father   . Drug abuse Father   . ADD / ADHD Brother   . Other Brother        bone marrow transplant  . Breast cancer Paternal Grandmother   . Aplastic anemia Brother   . Hypertension Maternal Grandmother   . Alcoholism Maternal Grandmother   . Diabetes Paternal Grandfather   . Cancer Paternal Grandfather   . Arrhythmia Brother   . Alcoholism Other   . Alcoholism Maternal Uncle      Social History   Occupational History  . Not on file  Tobacco Use  . Smoking status: Current Every Day Smoker    Packs/day: 0.00    Years: 7.00    Pack years: 0.00    Types: Cigarettes  . Smokeless tobacco: Never Used  . Tobacco comment: 1 cig./day  Substance and Sexual Activity  . Alcohol use: Yes    Alcohol/week: 1.0 standard drinks    Types: 1 Glasses of wine per week    Comment: 1 glass wine/day  . Drug use: Yes    Types: Marijuana    Comment: last used 04/13/2017  . Sexual activity: Yes    Partners: Male    Birth control/protection: None    No Known Allergies   Outpatient Medications Prior to Visit  Medication Sig Dispense Refill  . dextroamphetamine  (DEXTROSTAT) 10 MG tablet 3 po qam, 2 po qafternoon 150 tablet 0  . dextroamphetamine (DEXTROSTAT) 10 MG tablet Take 3 tablets (30 mg total) by mouth daily before breakfast AND 2 tablets (20 mg total) daily after lunch. 150 tablet 0  . dextroamphetamine (DEXTROSTAT) 10 MG tablet Take 3 tablets (30 mg total) by mouth daily before breakfast AND 2 tablets (20 mg total) daily after lunch. 150 tablet 0  . escitalopram (LEXAPRO) 20 MG tablet Take 1.5 tablets (30 mg total) by mouth daily. 135 tablet 1  . gabapentin (NEURONTIN) 400 MG capsule Take 1 capsule (400 mg total) by mouth at bedtime. 90 capsule 1  . medroxyPROGESTERone Acetate 150 MG/ML SUSY Inject 1 mL (150 mg total) into the muscle every 3 (three) months. (Patient not taking: Reported on 05/28/2018) 1 mL 3   No facility-administered medications prior to visit.     ROS   Objective:  There were no vitals filed for this visit.    Physical Exam: General: Well-appearing, no acute distress HENT: [place], AT, OP clear, MMM Eyes: EOMI, no scleral icterus Lymph: no cervical lymphadenopathy Respiratory: Clear to auscultation bilaterally.  No crackles, wheezing or rales Cardiovascular: RRR, -M/R/G, no JVD GI: BS+, soft, nontender Extremities:-Edema,-tenderness Neuro: AAO x4, CNII-XII grossly intact  Skin: Intact, no rashes or bruising Psych: Normal mood, normal affect  Data Reviewed:  Imaging:  PFT:  Labs:  Imaging, labs and tests noted above have been reviewed independently by me.    Assessment & Plan:   Discussion: ***  Health Maintenance Pneumonia*** Influenza*** CT Lung Screen***  No orders of the defined types were placed in this encounter. No orders of the defined types were placed in this encounter.   No follow-ups on file.  [place], MD [facility] 09/10/2018 9:42 AM  Office Number [phone]

## 2018-12-20 DIAGNOSIS — F1910 Other psychoactive substance abuse, uncomplicated: Secondary | ICD-10-CM | POA: Insufficient documentation

## 2018-12-20 DIAGNOSIS — T7411XA Adult physical abuse, confirmed, initial encounter: Secondary | ICD-10-CM | POA: Insufficient documentation

## 2019-06-05 ENCOUNTER — Emergency Department (HOSPITAL_COMMUNITY)
Admission: EM | Admit: 2019-06-05 | Discharge: 2019-06-05 | Payer: Self-pay | Attending: Emergency Medicine | Admitting: Emergency Medicine

## 2019-06-05 ENCOUNTER — Other Ambulatory Visit: Payer: Self-pay

## 2019-06-05 ENCOUNTER — Encounter (HOSPITAL_COMMUNITY): Payer: Self-pay

## 2019-06-05 DIAGNOSIS — F10920 Alcohol use, unspecified with intoxication, uncomplicated: Secondary | ICD-10-CM

## 2019-06-05 DIAGNOSIS — F10929 Alcohol use, unspecified with intoxication, unspecified: Secondary | ICD-10-CM | POA: Insufficient documentation

## 2019-06-05 DIAGNOSIS — R451 Restlessness and agitation: Secondary | ICD-10-CM | POA: Insufficient documentation

## 2019-06-05 DIAGNOSIS — R509 Fever, unspecified: Secondary | ICD-10-CM | POA: Insufficient documentation

## 2019-06-05 DIAGNOSIS — Z653 Problems related to other legal circumstances: Secondary | ICD-10-CM | POA: Insufficient documentation
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
## 2019-06-05 NOTE — ED Notes (Signed)
Patient refused to sign for dc
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
## 2019-07-01 ENCOUNTER — Ambulatory Visit: Payer: Self-pay

## 2019-07-05 ENCOUNTER — Ambulatory Visit: Payer: Self-pay

## 2019-10-18 ENCOUNTER — Encounter (HOSPITAL_COMMUNITY): Payer: Self-pay

## 2019-10-18 ENCOUNTER — Inpatient Hospital Stay (HOSPITAL_COMMUNITY)
Admission: EM | Admit: 2019-10-18 | Discharge: 2019-10-29 | DRG: 871 | Disposition: A | Discharge status: Home / Self Care | Payer: Self-pay | Attending: Internal Medicine | Admitting: Internal Medicine

## 2019-10-18 ENCOUNTER — Emergency Department (HOSPITAL_COMMUNITY): Payer: Self-pay

## 2019-10-18 ENCOUNTER — Other Ambulatory Visit: Payer: Self-pay

## 2019-10-18 DIAGNOSIS — Z8249 Family history of ischemic heart disease and other diseases of the circulatory system: Secondary | ICD-10-CM

## 2019-10-18 DIAGNOSIS — K7460 Unspecified cirrhosis of liver: Secondary | ICD-10-CM

## 2019-10-18 DIAGNOSIS — Z818 Family history of other mental and behavioral disorders: Secondary | ICD-10-CM

## 2019-10-18 DIAGNOSIS — K745 Biliary cirrhosis, unspecified: Secondary | ICD-10-CM

## 2019-10-18 DIAGNOSIS — F17290 Nicotine dependence, other tobacco product, uncomplicated: Secondary | ICD-10-CM | POA: Diagnosis present

## 2019-10-18 DIAGNOSIS — D62 Acute posthemorrhagic anemia: Secondary | ICD-10-CM | POA: Diagnosis present

## 2019-10-18 DIAGNOSIS — Z803 Family history of malignant neoplasm of breast: Secondary | ICD-10-CM

## 2019-10-18 DIAGNOSIS — F329 Major depressive disorder, single episode, unspecified: Secondary | ICD-10-CM | POA: Diagnosis present

## 2019-10-18 DIAGNOSIS — K2100 Gastro-esophageal reflux disease with esophagitis, without bleeding: Secondary | ICD-10-CM | POA: Diagnosis present

## 2019-10-18 DIAGNOSIS — F17210 Nicotine dependence, cigarettes, uncomplicated: Secondary | ICD-10-CM | POA: Diagnosis present

## 2019-10-18 DIAGNOSIS — K254 Chronic or unspecified gastric ulcer with hemorrhage: Secondary | ICD-10-CM

## 2019-10-18 DIAGNOSIS — M009 Pyogenic arthritis, unspecified: Secondary | ICD-10-CM | POA: Diagnosis present

## 2019-10-18 DIAGNOSIS — Z83438 Family history of other disorder of lipoprotein metabolism and other lipidemia: Secondary | ICD-10-CM

## 2019-10-18 DIAGNOSIS — N179 Acute kidney failure, unspecified: Secondary | ICD-10-CM | POA: Diagnosis present

## 2019-10-18 DIAGNOSIS — K449 Diaphragmatic hernia without obstruction or gangrene: Secondary | ICD-10-CM | POA: Diagnosis present

## 2019-10-18 DIAGNOSIS — R0789 Other chest pain: Secondary | ICD-10-CM

## 2019-10-18 DIAGNOSIS — K264 Chronic or unspecified duodenal ulcer with hemorrhage: Secondary | ICD-10-CM | POA: Diagnosis present

## 2019-10-18 DIAGNOSIS — K922 Gastrointestinal hemorrhage, unspecified: Secondary | ICD-10-CM | POA: Diagnosis not present

## 2019-10-18 DIAGNOSIS — K921 Melena: Secondary | ICD-10-CM | POA: Diagnosis not present

## 2019-10-18 DIAGNOSIS — A4101 Sepsis due to Methicillin susceptible Staphylococcus aureus: Principal | ICD-10-CM | POA: Diagnosis present

## 2019-10-18 DIAGNOSIS — J189 Pneumonia, unspecified organism: Secondary | ICD-10-CM | POA: Diagnosis present

## 2019-10-18 DIAGNOSIS — E876 Hypokalemia: Secondary | ICD-10-CM | POA: Diagnosis present

## 2019-10-18 DIAGNOSIS — F1910 Other psychoactive substance abuse, uncomplicated: Secondary | ICD-10-CM | POA: Diagnosis present

## 2019-10-18 DIAGNOSIS — I079 Rheumatic tricuspid valve disease, unspecified: Secondary | ICD-10-CM | POA: Diagnosis present

## 2019-10-18 DIAGNOSIS — R7881 Bacteremia: Secondary | ICD-10-CM | POA: Diagnosis present

## 2019-10-18 DIAGNOSIS — Z813 Family history of other psychoactive substance abuse and dependence: Secondary | ICD-10-CM

## 2019-10-18 DIAGNOSIS — M25511 Pain in right shoulder: Secondary | ICD-10-CM | POA: Diagnosis present

## 2019-10-18 DIAGNOSIS — A419 Sepsis, unspecified organism: Secondary | ICD-10-CM | POA: Diagnosis present

## 2019-10-18 DIAGNOSIS — Z20822 Contact with and (suspected) exposure to covid-19: Secondary | ICD-10-CM | POA: Diagnosis present

## 2019-10-18 DIAGNOSIS — F1120 Opioid dependence, uncomplicated: Secondary | ICD-10-CM | POA: Diagnosis present

## 2019-10-18 DIAGNOSIS — R112 Nausea with vomiting, unspecified: Secondary | ICD-10-CM | POA: Diagnosis present

## 2019-10-18 DIAGNOSIS — R079 Chest pain, unspecified: Secondary | ICD-10-CM

## 2019-10-18 DIAGNOSIS — Z833 Family history of diabetes mellitus: Secondary | ICD-10-CM

## 2019-10-18 DIAGNOSIS — I330 Acute and subacute infective endocarditis: Secondary | ICD-10-CM | POA: Diagnosis present

## 2019-10-18 DIAGNOSIS — E871 Hypo-osmolality and hyponatremia: Secondary | ICD-10-CM | POA: Diagnosis present

## 2019-10-18 DIAGNOSIS — R45851 Suicidal ideations: Secondary | ICD-10-CM | POA: Diagnosis not present

## 2019-10-18 DIAGNOSIS — Z79899 Other long term (current) drug therapy: Secondary | ICD-10-CM

## 2019-10-18 DIAGNOSIS — M419 Scoliosis, unspecified: Secondary | ICD-10-CM | POA: Diagnosis present

## 2019-10-18 DIAGNOSIS — I456 Pre-excitation syndrome: Secondary | ICD-10-CM | POA: Diagnosis present

## 2019-10-18 DIAGNOSIS — I2690 Septic pulmonary embolism without acute cor pulmonale: Secondary | ICD-10-CM | POA: Diagnosis present

## 2019-10-18 DIAGNOSIS — F419 Anxiety disorder, unspecified: Secondary | ICD-10-CM | POA: Diagnosis present

## 2019-10-18 DIAGNOSIS — T39395A Adverse effect of other nonsteroidal anti-inflammatory drugs [NSAID], initial encounter: Secondary | ICD-10-CM | POA: Diagnosis present

## 2019-10-18 DIAGNOSIS — E878 Other disorders of electrolyte and fluid balance, not elsewhere classified: Secondary | ICD-10-CM | POA: Diagnosis present

## 2019-10-18 DIAGNOSIS — R0602 Shortness of breath: Secondary | ICD-10-CM | POA: Diagnosis present

## 2019-10-18 DIAGNOSIS — Z5320 Procedure and treatment not carried out because of patient's decision for unspecified reasons: Secondary | ICD-10-CM | POA: Diagnosis not present

## 2019-10-18 DIAGNOSIS — F1123 Opioid dependence with withdrawal: Secondary | ICD-10-CM | POA: Diagnosis present

## 2019-10-18 HISTORY — DX: Sepsis, unspecified organism: A41.9

## 2019-10-18 LAB — CBC
HCT: 33.1 % — ABNORMAL LOW (ref 36.0–46.0)
Hemoglobin: 11.1 g/dL — ABNORMAL LOW (ref 12.0–15.0)
MCH: 28.6 pg (ref 26.0–34.0)
MCHC: 33.5 g/dL (ref 30.0–36.0)
MCV: 85.3 fL (ref 80.0–100.0)
Platelets: 210 10*3/uL (ref 150–400)
RBC: 3.88 MIL/uL (ref 3.87–5.11)
RDW: 13.6 % (ref 11.5–15.5)
WBC: 21.8 10*3/uL — ABNORMAL HIGH (ref 4.0–10.5)
nRBC: 0 % (ref 0.0–0.2)

## 2019-10-18 LAB — I-STAT BETA HCG BLOOD, ED (MC, WL, AP ONLY): I-stat hCG, quantitative: <5 m[IU]/mL (ref <5)

## 2019-10-18 LAB — BASIC METABOLIC PANEL
Anion gap: 12 (ref 5–15)
BUN: 31 mg/dL — ABNORMAL HIGH (ref 6–20)
CO2: 21 mmol/L — ABNORMAL LOW (ref 22–32)
Calcium: 8.9 mg/dL (ref 8.9–10.3)
Chloride: 99 mmol/L (ref 98–111)
Creatinine, Ser: 1.31 mg/dL — ABNORMAL HIGH (ref 0.44–1.00)
GFR calc Af Amer: >60 mL/min (ref >60)
GFR calc non Af Amer: 55 mL/min — ABNORMAL LOW (ref >60)
Glucose, Bld: 115 mg/dL — ABNORMAL HIGH (ref 70–99)
Potassium: 3.7 mmol/L (ref 3.5–5.1)
Sodium: 132 mmol/L — ABNORMAL LOW (ref 135–145)

## 2019-10-18 LAB — TROPONIN I (HIGH SENSITIVITY): Troponin I (High Sensitivity): 33 ng/L — ABNORMAL HIGH (ref <18)

## 2019-10-18 MED ORDER — IBUPROFEN 400 MG PO TABS
400.0000 mg | ORAL_TABLET | Freq: Once | ORAL | Status: AC | PRN
  Administered 2019-10-18: 400 mg via ORAL
  Filled 2019-10-18: qty 1

## 2019-10-18 NOTE — ED Triage Notes (Signed)
Pt reports that for the past 4 days she has been having central CP, worse at night, pain radiates to R arm, some nausea, and SOB.

## 2019-10-19 ENCOUNTER — Emergency Department (HOSPITAL_COMMUNITY): Payer: Self-pay

## 2019-10-19 ENCOUNTER — Encounter (HOSPITAL_COMMUNITY): Payer: Self-pay | Admitting: Internal Medicine

## 2019-10-19 ENCOUNTER — Other Ambulatory Visit (HOSPITAL_COMMUNITY): Payer: Medicaid Other

## 2019-10-19 ENCOUNTER — Other Ambulatory Visit: Payer: Self-pay

## 2019-10-19 DIAGNOSIS — N179 Acute kidney failure, unspecified: Secondary | ICD-10-CM

## 2019-10-19 DIAGNOSIS — F988 Other specified behavioral and emotional disorders with onset usually occurring in childhood and adolescence: Secondary | ICD-10-CM

## 2019-10-19 DIAGNOSIS — I456 Pre-excitation syndrome: Secondary | ICD-10-CM

## 2019-10-19 DIAGNOSIS — F1120 Opioid dependence, uncomplicated: Secondary | ICD-10-CM

## 2019-10-19 DIAGNOSIS — A419 Sepsis, unspecified organism: Secondary | ICD-10-CM

## 2019-10-19 DIAGNOSIS — I10 Essential (primary) hypertension: Secondary | ICD-10-CM

## 2019-10-19 DIAGNOSIS — F17290 Nicotine dependence, other tobacco product, uncomplicated: Secondary | ICD-10-CM

## 2019-10-19 DIAGNOSIS — R0789 Other chest pain: Secondary | ICD-10-CM

## 2019-10-19 DIAGNOSIS — M25511 Pain in right shoulder: Secondary | ICD-10-CM

## 2019-10-19 DIAGNOSIS — F329 Major depressive disorder, single episode, unspecified: Secondary | ICD-10-CM

## 2019-10-19 LAB — BLOOD CULTURE ID PANEL (REFLEXED) - BCID2

## 2019-10-19 LAB — RAPID URINE DRUG SCREEN, HOSP PERFORMED
Amphetamines: POSITIVE — AB
Barbiturates: NOT DETECTED
Benzodiazepines: NOT DETECTED
Cocaine: NOT DETECTED
Opiates: POSITIVE — AB
Tetrahydrocannabinol: NOT DETECTED

## 2019-10-19 LAB — URINALYSIS, ROUTINE W REFLEX MICROSCOPIC
Bilirubin Urine: NEGATIVE
Glucose, UA: NEGATIVE mg/dL
Hgb urine dipstick: NEGATIVE
Ketones, ur: NEGATIVE mg/dL
Leukocytes,Ua: NEGATIVE
Nitrite: NEGATIVE
Protein, ur: NEGATIVE mg/dL
Specific Gravity, Urine: 1.021 (ref 1.005–1.030)
pH: 5 (ref 5.0–8.0)

## 2019-10-19 LAB — CBG MONITORING, ED: Glucose-Capillary: 115 mg/dL — ABNORMAL HIGH (ref 70–99)

## 2019-10-19 LAB — TROPONIN I (HIGH SENSITIVITY): Troponin I (High Sensitivity): 31 ng/L — ABNORMAL HIGH (ref <18)

## 2019-10-19 LAB — SARS CORONAVIRUS 2 BY RT PCR (HOSPITAL ORDER, PERFORMED IN ~~LOC~~ HOSPITAL LAB): SARS Coronavirus 2: NEGATIVE

## 2019-10-19 LAB — BRAIN NATRIURETIC PEPTIDE
B Natriuretic Peptide: 397.1 pg/mL — ABNORMAL HIGH (ref 0.0–100.0)
B Natriuretic Peptide: 146.1 pg/mL — ABNORMAL HIGH (ref 0.0–100.0)

## 2019-10-19 LAB — LACTIC ACID, PLASMA: Lactic Acid, Venous: 1.4 mmol/L (ref 0.5–1.9)

## 2019-10-19 MED ORDER — MORPHINE BOLUS VIA INFUSION: 4.0000 mg | INTRAVENOUS | Status: DC | PRN

## 2019-10-19 MED ORDER — GABAPENTIN 400 MG PO CAPS
400.0000 mg | ORAL_CAPSULE | Freq: Every day | ORAL | Status: DC
  Administered 2019-10-20 – 2019-10-28 (×9): 400 mg via ORAL
  Filled 2019-10-19 (×10): qty 1

## 2019-10-19 MED ORDER — PIPERACILLIN-TAZOBACTAM 3.375 G IVPB 30 MIN
3.3750 g | Freq: Once | INTRAVENOUS | Status: AC
  Administered 2019-10-19: 3.375 g via INTRAVENOUS
  Filled 2019-10-19: qty 50

## 2019-10-19 MED ORDER — VANCOMYCIN HCL 1250 MG/250ML IV SOLN
1250.0000 mg | Freq: Once | INTRAVENOUS | Status: AC
  Administered 2019-10-19: 1250 mg via INTRAVENOUS
  Filled 2019-10-19: qty 250

## 2019-10-19 MED ORDER — ESCITALOPRAM OXALATE 20 MG PO TABS
30.0000 mg | ORAL_TABLET | Freq: Every day | ORAL | Status: DC
  Administered 2019-10-19: 30 mg via ORAL
  Filled 2019-10-19: qty 3

## 2019-10-19 MED ORDER — ALUM & MAG HYDROXIDE-SIMETH 200-200-20 MG/5ML PO SUSP
30.0000 mL | Freq: Once | ORAL | Status: AC
  Administered 2019-10-19: 30 mL via ORAL
  Filled 2019-10-19: qty 30

## 2019-10-19 MED ORDER — MORPHINE SULFATE (PF) 2 MG/ML IV SOLN
2.0000 mg | Freq: Once | INTRAVENOUS | Status: AC
  Administered 2019-10-19: 2 mg via INTRAVENOUS
  Filled 2019-10-19: qty 1

## 2019-10-19 MED ORDER — ACETAMINOPHEN 325 MG PO TABS
650.0000 mg | ORAL_TABLET | Freq: Once | ORAL | Status: AC
  Administered 2019-10-19: 650 mg via ORAL
  Filled 2019-10-19: qty 2

## 2019-10-19 MED ORDER — OXYCODONE HCL 5 MG PO TABS
5.0000 mg | ORAL_TABLET | ORAL | Status: DC | PRN
  Administered 2019-10-19 (×2): 10 mg via ORAL
  Administered 2019-10-20 (×3): 5 mg via ORAL
  Administered 2019-10-20 – 2019-10-21 (×4): 10 mg via ORAL
  Filled 2019-10-19 (×3): qty 2
  Filled 2019-10-19 (×2): qty 1
  Filled 2019-10-19: qty 2
  Filled 2019-10-19: qty 1
  Filled 2019-10-19 (×2): qty 2

## 2019-10-19 MED ORDER — MORPHINE SULFATE (PF) 4 MG/ML IV SOLN
4.0000 mg | Freq: Once | INTRAVENOUS | Status: AC
  Administered 2019-10-19: 4 mg via INTRAVENOUS
  Filled 2019-10-19: qty 1

## 2019-10-19 MED ORDER — ACETAMINOPHEN 325 MG PO TABS
650.0000 mg | ORAL_TABLET | Freq: Four times a day (QID) | ORAL | Status: DC | PRN
  Administered 2019-10-19: 650 mg via ORAL
  Filled 2019-10-19: qty 2

## 2019-10-19 MED ORDER — ACETAMINOPHEN 650 MG RE SUPP: 650.0000 mg | Freq: Four times a day (QID) | RECTAL | Status: DC | PRN

## 2019-10-19 MED ORDER — ASPIRIN 81 MG PO CHEW
324.0000 mg | CHEWABLE_TABLET | Freq: Once | ORAL | Status: AC
  Administered 2019-10-19: 324 mg via ORAL
  Filled 2019-10-19: qty 4

## 2019-10-19 MED ORDER — IOHEXOL 300 MG/ML  SOLN
60.0000 mL | Freq: Once | INTRAMUSCULAR | Status: AC | PRN
  Administered 2019-10-19: 60 mL via INTRAVENOUS

## 2019-10-19 MED ORDER — DICLOFENAC SODIUM 1 % EX GEL
4.0000 g | Freq: Four times a day (QID) | CUTANEOUS | Status: DC
  Administered 2019-10-20 – 2019-10-29 (×13): 4 g via TOPICAL
  Filled 2019-10-19: qty 100

## 2019-10-19 MED ORDER — CEFAZOLIN SODIUM-DEXTROSE 2-4 GM/100ML-% IV SOLN
2.0000 g | Freq: Three times a day (TID) | INTRAVENOUS | Status: DC
  Administered 2019-10-20 – 2019-10-29 (×30): 2 g via INTRAVENOUS
  Filled 2019-10-19 (×32): qty 100

## 2019-10-19 MED ORDER — HEPARIN SODIUM (PORCINE) 5000 UNIT/ML IJ SOLN
5000.0000 [IU] | Freq: Three times a day (TID) | INTRAMUSCULAR | Status: DC
  Administered 2019-10-19 – 2019-10-20 (×2): 5000 [IU] via SUBCUTANEOUS
  Filled 2019-10-19 (×2): qty 1

## 2019-10-19 MED ORDER — VANCOMYCIN HCL 500 MG/100ML IV SOLN
500.0000 mg | Freq: Two times a day (BID) | INTRAVENOUS | Status: DC
  Filled 2019-10-19: qty 100

## 2019-10-19 MED ORDER — DEXTROAMPHETAMINE SULFATE 5 MG PO TABS: 20.0000 mg | ORAL_TABLET | Freq: Two times a day (BID) | ORAL | Status: DC | PRN

## 2019-10-19 MED ORDER — PIPERACILLIN-TAZOBACTAM 3.375 G IVPB
3.3750 g | Freq: Three times a day (TID) | INTRAVENOUS | Status: DC
  Administered 2019-10-19: 3.375 g via INTRAVENOUS
  Filled 2019-10-19: qty 50

## 2019-10-19 MED ORDER — MORPHINE SULFATE (PF) 4 MG/ML IV SOLN
4.0000 mg | INTRAVENOUS | Status: DC | PRN
  Administered 2019-10-21 – 2019-10-22 (×7): 4 mg via INTRAVENOUS
  Filled 2019-10-19 (×7): qty 1

## 2019-10-19 MED ORDER — SODIUM CHLORIDE 0.9 % IV BOLUS
500.0000 mL | Freq: Once | INTRAVENOUS | Status: AC
  Administered 2019-10-19: 500 mL via INTRAVENOUS

## 2019-10-19 MED ORDER — LIDOCAINE VISCOUS HCL 2 % MT SOLN
15.0000 mL | Freq: Once | OROMUCOSAL | Status: AC
  Administered 2019-10-19: 15 mL via ORAL
  Filled 2019-10-19: qty 15

## 2019-10-19 NOTE — ED Notes (Signed)
ED Provider at bedside. 
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
## 2019-10-19 NOTE — ED Provider Notes (Signed)
Patient is a 29 year old female whose care was transferred to me at shift change from [place] PA-C.  Her HPI is below:  Evoleht Hovatter is a 29 y.o. female with a hx of ADD, Wolff-Parkinson-White, depression presents to the Emergency Department complaining of gradual, persistent, progressively worsening central chest pain with associated radiation into the right shoulder onset 2 days ago.  Additionally, patient reports associated dryness on the hands and face, shortness of breath, pleurisy, cough, nausea, vomiting.  Patient denies birth control usage but is a smoker.  Additionally she reports associated low-grade fevers, nausea and intermittent vomiting.  She reports generalized weakness and generally feeling ill.  Denies any known Covid contacts.  Physical Exam  BP 133/86 (BP Location: Right Arm)   Pulse 97   Temp 98.4 F (36.9 C) (Oral)   Resp 18   SpO2 100%   Physical Exam Vitals and nursing note reviewed.  Constitutional:      General: She is not in acute distress.    Appearance: She is not diaphoretic.  HENT:     Head: Normocephalic.  Eyes:     General: No scleral icterus.    Conjunctiva/sclera: Conjunctivae normal.  Cardiovascular:     Rate and Rhythm: Normal rate and regular rhythm.     Pulses: Normal pulses.          Radial pulses are 2+ on the right side and 2+ on the left side.  Pulmonary:     Effort: No tachypnea, accessory muscle usage, prolonged expiration, respiratory distress or retractions.     Breath sounds: No stridor.     Comments: LCTAB Chest:     Chest wall: No edema.  Abdominal:     General: There is no distension.     Palpations: Abdomen is soft.     Tenderness: There is no abdominal tenderness. There is no guarding or rebound.  Musculoskeletal:     Cervical back: Normal range of motion.     Comments: Moves all extremities equally and without difficulty.  Skin:    General: Skin is warm and dry.     Capillary Refill: Capillary refill takes less than 2  seconds.  Neurological:     Mental Status: She is alert.     GCS: GCS eye subscore is 4. GCS verbal subscore is 5. GCS motor subscore is 6.     Comments: Speech is clear and goal oriented.  Psychiatric:        Mood and Affect: Mood normal.   ED Course/Procedures   Clinical Course as of Oct 19 1022  Tue Oct 19, 2019  0620 Noted - possible pneumonia and COVID  WBC(!): 21.8 [HM]  1004 1. No evidence of acute pulmonary embolism to the level of the proximal segmental pulmonary arteries. 2. Multifocal bilateral nodular areas of consolidation with a few areas of possible early cavitation. Primary differential considerations include septic emboli (favored) and multifocal pneumonia (including atypical infections). Correlate with any history of IV drug use and consider echocardiography to further evaluate. Metastatic disease is felt unlikekely given patient's young age and no known history of malignancy; however, recommend follow-up to resolution. 3. Borderline enlargement of the main pulmonary artery, which can be seen with pulmonary arterial hypertension.  CT Angio Chest PE W and/or Wo Contrast [LJ]    Clinical Course User Index [HM] Muthersbaugh, Jarrett Soho, PA-C [LJ] Rayna Sexton, PA-C   Procedures  MDM  Patient is a 29 year old female whose care was transferred to me at shift change from [place] PA-C.  She presented today due to gradual, persistent, progressively worsening central chest pain which radiates to her right shoulder.  Began about 2 days ago.  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  Patient had multiple symptoms such as fevers, chills, nausea, vomiting, body aches.  Concern for COVID-19 but patient was found to be negative for this today.  Labs today show an elevated creatinine of 1.31.  Leukocytosis of 21.8.  Elevated BNP at 397.1.  Elevated troponins at 33 and 31 respectively.  Given her symptoms and vital signs, a PE study was  obtained.  No evidence of acute PE but there is multifocal bilateral nodular areas of consolidation with few areas of possible early cavitation.  Primary differential includes septic emboli which is favored considering her history of drug use.  Patient was discussed with and evaluated by my attending physician Dr. Gareth Morgan.  We will start patient on vancomycin as well as Zosyn.  Will discuss with hospitalist team for admission and further work-up.  Plan was discussed with patient and she is amenable.  Note: Portions of this report may have been transcribed using voice recognition software. Every effort was made to ensure accuracy; however, inadvertent computerized transcription errors may be present.       Rayna Sexton, PA-C 10/19/19 [place], MD 10/19/19 2250

## 2019-10-19 NOTE — ED Provider Notes (Signed)
[facility] EMERGENCY DEPARTMENT Provider Note   CSN: [id number] Arrival date & time: 10/18/19  2003     History Chief Complaint  Patient presents with  . Chest Pain    Suzanne [name] is a 29 y.o. female with a hx of ADD, Wolff-Parkinson-White, depression presents to the Emergency Department complaining of gradual, persistent, progressively worsening central chest pain with associated radiation into the right shoulder onset 2 days ago.  Additionally, patient reports associated dryness on the hands and face, shortness of breath, pleurisy, cough, nausea, vomiting.  Patient denies birth control usage but is a smoker.  Additionally she reports associated low-grade fevers, nausea and intermittent vomiting.  She reports generalized weakness and generally feeling ill.  Denies any known Covid contacts.   The history is provided by the patient and medical records. No language interpreter was used.       Past Medical History:  Diagnosis Date  . 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 Depression   . Lipoma of neck 04/2017   posterior  . Scoliosis    idiopathic   . WPW (Wolff-Parkinson-White syndrome)     Patient Active Problem List   Diagnosis Date Noted  . Irritability 03/29/2016  . Sinus tachycardia 08/07/2015  . Shortness of breath 08/07/2015  . SVT (supraventricular tachycardia) ([place]) 05/17/2015  . Major depression 05/03/2015  . Wolff-Parkinson-White syndrome 05/18/2014  . Smoker 05/18/2014  . ADD (attention deficit disorder) 08/18/2013    Past Surgical History:  Procedure Laterality Date  . COLPOSCOPY  01/2010 & 02/2011    both were negative   . ELECTROPHYSIOLOGIC STUDY N/A 05/17/2015   SVT ablation by Dr Curt Bears  . LIPOMA EXCISION N/A 05/07/2017   Procedure: EXCISION LIPOMA POSTERIOR NECK ERAS PATHWAY;  Surgeon: Erroll Luna, MD;  Location: [place];  Service: General;  Laterality: N/A;  . WISDOM TOOTH EXTRACTION        OB History    Gravida  0   Para      Term      Preterm      AB      Living        SAB      TAB      Ectopic      Multiple      Live Births              Family History  Problem Relation Age of Onset  . Other Brother        substance abuse  . Hyperlipidemia Maternal Grandfather   . Anxiety disorder Maternal Grandfather   . Alcoholism Maternal Grandfather   . Anxiety disorder Mother   . ADD / ADHD Father   . Drug abuse Father   . ADD / ADHD Brother   . Other Brother        bone marrow transplant  . Breast cancer Paternal Grandmother   . Aplastic anemia Brother   . Hypertension Maternal Grandmother   . Alcoholism Maternal Grandmother   . Diabetes Paternal Grandfather   . Cancer Paternal Grandfather   . Arrhythmia Brother   . Alcoholism Other   . Alcoholism Maternal Uncle     Social History   Tobacco Use  . Smoking status: Current Every Day Smoker    Packs/day: 0.00    Years: 7.00    Pack years: 0.00    Types: Cigarettes  . Smokeless tobacco: Never Used  . Tobacco comment: 1 cig./day  Vaping Use  .  Vaping Use: Every day  Substance Use Topics  . Alcohol use: Yes    Alcohol/week: 1.0 standard drink    Types: 1 Glasses of wine per week    Comment: 1 glass wine/day  . Drug use: Yes    Types: Marijuana, IV    Comment: last used 04/13/2017    Home Medications Prior to Admission medications   Medication Sig Start Date End Date Taking? Authorizing Provider  dextroamphetamine (DEXTROSTAT) 10 MG tablet 3 po qam, 2 po qafternoon 12/31/17   Rutherford Guys, MD  dextroamphetamine (DEXTROSTAT) 10 MG tablet Take 3 tablets (30 mg total) by mouth daily before breakfast AND 2 tablets (20 mg total) daily after lunch. 01/30/18 03/01/18  Rutherford Guys, MD  dextroamphetamine (DEXTROSTAT) 10 MG tablet Take 3 tablets (30 mg total) by mouth daily before breakfast AND 2 tablets (20 mg total) daily after lunch. 03/02/18 04/01/18  Rutherford Guys, MD   escitalopram (LEXAPRO) 20 MG tablet Take 1.5 tablets (30 mg total) by mouth daily. 12/31/17   Rutherford Guys, MD  gabapentin (NEURONTIN) 400 MG capsule Take 1 capsule (400 mg total) by mouth at bedtime. 12/31/17   Rutherford Guys, MD  medroxyPROGESTERone Acetate 150 MG/ML SUSY Inject 1 mL (150 mg total) into the muscle every 3 (three) months. Patient not taking: Reported on 05/28/2018 03/25/18   Rutherford Guys, MD    Allergies    Patient has no known allergies.  Review of Systems   Review of Systems  Constitutional: Positive for fatigue. Negative for appetite change, diaphoresis, fever and unexpected weight change.  HENT: Negative for mouth sores.   Eyes: Negative for visual disturbance.  Respiratory: Positive for cough and shortness of breath. Negative for chest tightness and wheezing.   Cardiovascular: Positive for chest pain.  Gastrointestinal: Positive for nausea and vomiting. Negative for abdominal pain, constipation and diarrhea.  Endocrine: Negative for polydipsia, polyphagia and polyuria.  Genitourinary: Negative for dysuria, frequency, hematuria and urgency.  Musculoskeletal: Negative for back pain and neck stiffness.  Skin: Negative for rash.  Allergic/Immunologic: Negative for immunocompromised state.  Neurological: Positive for headaches. Negative for syncope and light-headedness.  Hematological: Does not bruise/bleed easily.  Psychiatric/Behavioral: Negative for sleep disturbance. The patient is not nervous/anxious.     Physical Exam Updated Vital Signs BP 133/86 (BP Location: Right Arm)   Pulse 97   Temp 98.4 F (36.9 C) (Oral)   Resp 18   SpO2 100%    Physical Exam Vitals and nursing note reviewed.  Constitutional:      General: She is not in acute distress.    Appearance: She is not diaphoretic.  HENT:     Head: Normocephalic.  Eyes:     General: No scleral icterus.    Conjunctiva/sclera: Conjunctivae normal.  Cardiovascular:     Rate and Rhythm:  Normal rate and regular rhythm.     Pulses: Normal pulses.          Radial pulses are 2+ on the right side and 2+ on the left side.  Pulmonary:     Effort: No tachypnea, accessory muscle usage, prolonged expiration, respiratory distress or retractions.     Breath sounds: No stridor.     Comments: Equal chest rise. No increased work of breathing. Chest:     Chest wall: No edema.  Abdominal:     General: There is no distension.     Palpations: Abdomen is soft.     Tenderness: There is no abdominal tenderness.  There is no guarding or rebound.  Musculoskeletal:     Cervical back: Normal range of motion.     Comments: Moves all extremities equally and without difficulty.  Skin:    General: Skin is warm and dry.     Capillary Refill: Capillary refill takes less than 2 seconds.  Neurological:     Mental Status: She is alert.     GCS: GCS eye subscore is 4. GCS verbal subscore is 5. GCS motor subscore is 6.     Comments: Speech is clear and goal oriented.  Psychiatric:        Mood and Affect: Mood normal.     ED Results / Procedures / Treatments   Labs (all labs ordered are listed, but only abnormal results are displayed) Labs Reviewed  BASIC METABOLIC PANEL - Abnormal; Notable for the following components:      Result Value   Sodium 132 (*)    CO2 21 (*)    Glucose, Bld 115 (*)    BUN 31 (*)    Creatinine, Ser 1.31 (*)    GFR calc non Af Amer 55 (*)    All other components within normal limits  CBC - Abnormal; Notable for the following components:   WBC 21.8 (*)    Hemoglobin 11.1 (*)    HCT 33.1 (*)    All other components within normal limits  CBG MONITORING, ED - Abnormal; Notable for the following components:   Glucose-Capillary 115 (*)    All other components within normal limits  TROPONIN I (HIGH SENSITIVITY) - Abnormal; Notable for the following components:   Troponin I (High Sensitivity) 33 (*)    All other components within normal limits  TROPONIN I (HIGH  SENSITIVITY) - Abnormal; Notable for the following components:   Troponin I (High Sensitivity) 31 (*)    All other components within normal limits  SARS CORONAVIRUS 2 BY RT PCR (HOSPITAL ORDER, [place])  BRAIN NATRIURETIC PEPTIDE  RAPID URINE DRUG SCREEN, HOSP PERFORMED  URINALYSIS, ROUTINE W REFLEX MICROSCOPIC  BRAIN NATRIURETIC PEPTIDE  I-STAT BETA HCG BLOOD, ED ([facility], [facility], [facility] ONLY)    EKG EKG Interpretation  Date/Time:  Monday October 18 2019 20:24:46 EDT Ventricular Rate:  134 PR Interval:  116 QRS Duration: 78 QT Interval:  296 QTC Calculation: 442 R Axis:   81 Text Interpretation: Sinus tachycardia ST & T wave abnormality, consider inferior ischemia ST & T wave abnormality, consider anterolateral ischemia Abnormal ECG No previous ECGs available Confirmed by Ezequiel Essex [phone]) on 10/19/2019 5:50:40 AM   EKG Interpretation  Date/Time:  Tuesday October 19 2019 06:18:40 EDT Ventricular Rate:  96 PR Interval:  82 QRS Duration: 130 QT Interval:  388 QTC Calculation: 490 R Axis:   26 Text Interpretation: Normal sinus rhythm Ventricular pre-excitation, WPW pattern type B Abnormal ECG Confirmed by Orpah Greek [phone]) on 10/19/2019 6:25:36 AM         Radiology DG Chest 2 View  Result Date: 10/18/2019 CLINICAL DATA:  Chest pain.  Nausea and shortness of breath EXAM: CHEST - 2 VIEW COMPARISON:  None. FINDINGS: Lateral view degraded by patient arm position. Midline trachea. Normal heart size and mediastinal contours. No pleural effusion or pneumothorax. Clear lungs. Biapical pleural thickening. IMPRESSION: No acute cardiopulmonary disease. Electronically Signed   By: Abigail Miyamoto M.D.   On: 10/18/2019 20:41    Procedures Procedures (including critical care time)  Medications Ordered in ED Medications  ibuprofen (ADVIL)  tablet 400 mg (400 mg Oral Given 10/18/19 2027)  acetaminophen (TYLENOL) tablet 650 mg (650 mg Oral Given 10/19/19  0358)  aspirin chewable tablet 324 mg (324 mg Oral Given 10/19/19 0646)  sodium chloride 0.9 % bolus 500 mL (500 mLs Intravenous New Bag/Given 10/19/19 0646)  morphine 4 MG/ML injection 4 mg (4 mg Intravenous Given 10/19/19 0645)    ED Course  I have reviewed the triage vital signs and the nursing notes.  Pertinent labs & imaging results that were available during my care of the patient were reviewed by me and considered in my medical decision making (see chart for details).  Clinical Course as of Oct 19 650  Tue Oct 19, 2019  0620 Noted - possible pneumonia and COVID  WBC(!): 21.8 [HM]    Clinical Course User Index [HM] Suzanne [name], Gwenlyn Perking   MDM Rules/Calculators/A&P                          Suzanne [name] was evaluated in Emergency Department on 10/19/2019 for the symptoms described in the history of present illness. She was evaluated in the context of the global COVID-19 pandemic, which necessitated consideration that the patient might be at risk for infection with the SARS-CoV-2 virus that causes COVID-19. Institutional protocols and algorithms that pertain to the evaluation of patients at risk for COVID-19 are in a state of rapid change based on information released by regulatory bodies including the CDC and federal and state organizations. These policies and algorithms were followed during the patient's care in the ED.   Patient presents with Covid-like illness, central chest pain and pain radiating to her right shoulder.  Initial EKG with tachycardia and inverted T waves.  Concern for possible ischemia.  Troponin elevated slightly on initial and repeat but not significant change between the 2..  Other electrolyte abnormalities include hyponatremia, hypochloremia, low glucose and AKI.  Small fluid bolus given.  Covid testing, BNP and CT angiogram pending to rule out pulmonary embolism.  No hypoxia on room air at rest.  6:22 AM Repeat EKG reassuring.  T wave inversion has resolved.   Tachycardia improved.  Patient will be monitored for a number of hours for reevaluation and disposition by the next provider.  At shift change care was transferred to [facility] who will follow pending studies, re-evaulate and determine disposition.       Final Clinical Impression(s) / ED Diagnoses Final diagnoses:  Nonspecific chest pain  Suspected COVID-19 virus infection    Rx / DC Orders ED Discharge Orders    None       Jaylei Fuerte, Gwenlyn Perking 10/19/19 6734    Orpah Greek, MD 10/19/19 [phone]

## 2019-10-19 NOTE — Progress Notes (Signed)
Pharmacy Antibiotic Note  Suzanne [name] is a 29 y.o. female admitted on 10/18/2019 with hx IVDA, concern for bacteremia with septic emboli on CTA.  Pharmacy has been consulted for vancomycin and zosyn dosing.  Plan: Vancomycin 1250 mg IV x 1, then 500 mg IV every 12 hours (Vancomycin trough target 15-20) Zosyn 3.375 g IV every 8 hours (extended infusion) Monitor renal function, Cx and clinical progression to narrow Vancomycin trough at steady state      Temp (24hrs), Avg:98.5 F (36.9 C), Min:98.4 F (36.9 C), Max:98.6 F (37 C)  Recent Labs  Lab 10/18/19 2024  WBC 21.8*  CREATININE 1.31*    CrCl cannot be calculated (Unknown ideal weight.).    No Known Allergies  Bertis Ruddy, PharmD Clinical Pharmacist ED Pharmacist Phone # [phone] 10/19/2019 10:26 AM

## 2019-10-19 NOTE — ED Notes (Signed)
Pt requesting something for pain.  

## 2019-10-19 NOTE — Progress Notes (Signed)
Notified by pharmacy that patient's BC's resulted and were positive for MSSA. Pharmacy messaged me and recommended narrowing antibiotic coverage to ancef 2g Q8H.   Ancef ordered and discontinued vancomycin and zosyn. ID consulted.

## 2019-10-19 NOTE — Progress Notes (Signed)
PIV consult: Arrived to ED, no duckbill N95 available. Will request another IV RN to assess.

## 2019-10-19 NOTE — H&P (Signed)
Date: 10/19/2019               Patient Name:  Suzanne [name] MRN: [id number]  DOB: 03/15/1990 Age / Sex: 29 y.o., female   PCP: Mancel Bale, PA-C         Medical Service: Internal Medicine Teaching Service         Attending Physician: Dr. Lucious Groves, DO    First Contact: Dr. Konrad Penta Pager: [phone]  Second Contact: Dr. Myrtie Hawk Pager: [phone]       After Hours (After 5p/  First Contact Pager: [phone]  weekends / holidays): Second Contact Pager: [phone]   Chief Complaint: Chest pain  History of Present Illness: This is a 29 year old female who uses IV drugs with a history of WPW, ADD, and depression presenting with worsening chest pain over the past 5 days.  Chest pain located in the middle of her chest, radiates to her right arm, sharp in nature, worse with movement and when she coughs. Nothing seems to improve the pain. Associated with nausea, vomiting, shortness of breath, pleuritic chest pain, and nonproductive cough.  Also endorsed subjective fevers and feeling generally unwell.  No known Covid contacts. Denies any sick contacts. She reports using heroin a few weeks ago. States that her fiance had an infection in his blood stream in [place], similar to what she is experiencing now.   In the ED patient was noted to be afebrile, tachycardic up to 136, mildly hypertensive up to 142/87, respiratory rate 18.  Labs showed troponins 33> 31.  Beta hCG less than 5.  BMP showed sodium 132, potassium 3.7, bicarb 21, creatinine 1.31(baseline around 0.6), normal anion gap.  CBC showed WBC 21, hemoglobin 11, platelet 210.  BNP 397.  Chest x-ray showed no acute opacities or pulmonary edema.  Covid test negative. EKG showed initially concerning for ST changes, improved on repeat EKG. Given persistent tachycardia CTA was obtained that showed no acute PE, multifocal bilateral nodular areas of consolidation with few areas of possible early cavitation, differential includes septic emboli versus  multifocal pneumonia. Findings concerning for septic emboli from endocarditis.  Blood cultures were obtained and patient started on Vanco and Zosyn.  Admitted to IMTS for further management.   Meds:  Current Meds  Medication Sig  . dextroamphetamine (DEXTROSTAT) 10 MG tablet 3 po qam, 2 po qafternoon (Patient taking differently: Take 20-30 mg by mouth 2 (two) times daily as needed (ADHD *no more than 50mg /max per day*). )  . escitalopram (LEXAPRO) 20 MG tablet Take 1.5 tablets (30 mg total) by mouth daily.  Marland Kitchen gabapentin (NEURONTIN) 400 MG capsule Take 1 capsule (400 mg total) by mouth at bedtime.   Allergies: Allergies as of 10/18/2019  . (No Known Allergies)   Past Medical History:  Diagnosis Date  . 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 Depression   . Lipoma of neck 04/2017   posterior  . Scoliosis    idiopathic   . WPW (Wolff-Parkinson-White syndrome)     Family History:  Denies any significant family history, no cardiac or cancer history noted.   Social History: Reports vaping daily, 1 ciggerrete per week. Denies EtOH use. History of IVDU, used heroin about 2 weeks ago, has been on methadone in the past. Lives with fiance who she reports is a "drug addict". Works at [employer].   Review of Systems: A complete ROS was negative except as per HPI.   Physical Exam: Blood pressure  117/87, pulse 93, temperature 98.4 F (36.9 C), temperature source Oral, resp. rate (!) 26, SpO2 100 %. Physical Exam Constitutional:      Appearance: She is well-developed.  HENT:     Head: Normocephalic and atraumatic.  Eyes:     Extraocular Movements: Extraocular movements intact.     Pupils: Pupils are equal, round, and reactive to light.  Cardiovascular:     Rate and Rhythm: Normal rate and regular rhythm.     Heart sounds: Normal heart sounds. Heart sounds not distant.  Pulmonary:     Effort: Pulmonary effort is normal.     Breath sounds: Normal breath sounds.    Chest:     Chest wall: No mass, tenderness or edema.  Abdominal:     General: Bowel sounds are normal.     Palpations: Abdomen is soft.  Musculoskeletal:        General: Normal range of motion.     Cervical back: Normal range of motion and neck supple.  Skin:    General: Skin is warm and dry.     Capillary Refill: Capillary refill takes less than 2 seconds.  Neurological:     General: No focal deficit present.     Mental Status: She is alert and oriented to person, place, and time.  Psychiatric:        Mood and Affect: Mood normal.        Behavior: Behavior normal.     EKG: personally reviewed my interpretation is sinus tachycardia, TWI in 2, 3, and aVF, and V3-V6  Repeat EKG on 9/7: Personally reviewed, my interpretation is NSR, normal axis, TWI resolved  CXR: personally reviewed my interpretation is no acute cardiopulmonary disease  CTA Chest: IMPRESSION: 1. No evidence of acute pulmonary embolism to the level of the proximal segmental pulmonary arteries. 2. Multifocal bilateral nodular areas of consolidation with a few areas of possible early cavitation. Primary differential considerations include septic emboli (favored) and multifocal pneumonia (including atypical infections). Correlate with any history of IV drug use and consider echocardiography to further evaluate. Metastatic disease is felt unlikekely given patient's young age and no known history of malignancy; however, recommend follow-up to resolution. 3. Borderline enlargement of the main pulmonary artery, which can be seen with pulmonary arterial hypertension.  Assessment & Plan by Problem: Active Problems:   Sepsis [facility])  This is a 29 year old female who uses IV drugs with history of ADD, depression, and Wolff-Parkinson-White syndrome presenting with chest pain, shortness of breath, pleurisy, and subjective fevers.  CTA showed findings concerning for septic emboli versus atypical pneumonia, favored septic  emboli given history of IVDU.  Patient started on broad-spectrum antibiotics and blood cultures were obtained.  Sepsis 2/2 suspected endocarditis: Chest pain: Patient presenting with a 2-day history of progressively persistent central chest pain with radiation down her right arm, associated with shortness of breath, nausea, vomiting, pleuritic chest pain, nonproductive cough, and low-grade fevers.  No known Covid contacts.  Labs significant for leukocytosis of 21, AKI with creatinine 1.3, elevated troponins at 33 and 31. Chest x-ray was unremarkable.  EKG showed initial ST changes that resolved on repeat EKG. Heart score of 3, low risk for ACS at this time. Given persistent tachycardia, and symptoms obtained a CTA to evaluate for PE.  CTA showed no evidence of PE however findings concerning for septic emboli versus atypical pneumonia, favored septic emboli given history of IVDU. Currently does not meet criteria for endocarditis based on Dukes criteria however will empirically treat  due to high suspicion for endocarditis, will await blood cultures.  -Follow-up blood cultures -Continue vancomycin and Zosyn -Echocardiogram -Monitor fever curve -Daily CBC -Follow-up urinalysis and UDS -OxyIR for pain control  AKI: Creatinine 1.3 on arrival, up from baseline of 0.7.  Patient given bolus of fluids. Likely pre-renal 2/2 sepsis from above.   -Daily BMP  ADD: Major depressive disorder: Patient is on lexapro and dexamphetamine at home.  Continues to have some agitation currently while admitted. Will resume home medications.   -Resume lexapro -Resume home dexoamphetamine  History of IV drug use: Last use was about 2 weeks ago. Reports that she has been on methadone in the past, not currently on any medications.  -Consult social work  Dispo: Admit patient to Inpatient with expected length of stay greater than 2 midnights.  Signed: Asencion Noble, MD 10/19/2019, 12:32 PM  Pager:  [phone] After 5pm on weekdays and 1pm on weekends: On Call pager: [phone]

## 2019-10-19 NOTE — ED Notes (Signed)
Kim in lab advised they could add on BNP to previously drawn specimen.

## 2019-10-19 NOTE — Progress Notes (Signed)
PHARMACY - PHYSICIAN COMMUNICATION CRITICAL VALUE ALERT - BLOOD CULTURE IDENTIFICATION (BCID)  Suzanne [name] is an 29 y.o. female who presented to [facility] on 10/18/2019 with a chief complaint of septic emboli (IV drug user)  Assessment:  Pt growing MSSA in blood cultures  Name of physician (or Provider) Contacted: Dr. Johnney Ou  Current antibiotics: Zosyn and Vancomycin  Changes to prescribed antibiotics recommended:  Narrow to Ancef 2gm IV q8h. ID will get automatic consult in a.m.  Results for orders placed or performed during the hospital encounter of 10/18/19  Blood Culture ID Panel (Reflexed) (Collected: 10/19/2019  9:57 AM)  Result Value Ref Range   Enterococcus faecalis NOT DETECTED NOT DETECTED   Enterococcus Faecium NOT DETECTED NOT DETECTED   Listeria monocytogenes NOT DETECTED NOT DETECTED   Staphylococcus species DETECTED (A) NOT DETECTED   Staphylococcus aureus (BCID) DETECTED (A) NOT DETECTED   Staphylococcus epidermidis NOT DETECTED NOT DETECTED   Staphylococcus lugdunensis NOT DETECTED NOT DETECTED   Streptococcus species NOT DETECTED NOT DETECTED   Streptococcus agalactiae NOT DETECTED NOT DETECTED   Streptococcus pneumoniae NOT DETECTED NOT DETECTED   Streptococcus pyogenes NOT DETECTED NOT DETECTED   A.calcoaceticus-baumannii NOT DETECTED NOT DETECTED   Bacteroides fragilis NOT DETECTED NOT DETECTED   Enterobacterales NOT DETECTED NOT DETECTED   Enterobacter cloacae complex NOT DETECTED NOT DETECTED   Escherichia coli NOT DETECTED NOT DETECTED   Klebsiella aerogenes NOT DETECTED NOT DETECTED   Klebsiella oxytoca NOT DETECTED NOT DETECTED   Klebsiella pneumoniae NOT DETECTED NOT DETECTED   Proteus species NOT DETECTED NOT DETECTED   Salmonella species NOT DETECTED NOT DETECTED   Serratia marcescens NOT DETECTED NOT DETECTED   Haemophilus influenzae NOT DETECTED NOT DETECTED   Neisseria meningitidis NOT DETECTED NOT DETECTED   Pseudomonas aeruginosa NOT  DETECTED NOT DETECTED   Stenotrophomonas maltophilia NOT DETECTED NOT DETECTED   Candida albicans NOT DETECTED NOT DETECTED   Candida auris NOT DETECTED NOT DETECTED   Candida glabrata NOT DETECTED NOT DETECTED   Candida krusei NOT DETECTED NOT DETECTED   Candida parapsilosis NOT DETECTED NOT DETECTED   Candida tropicalis NOT DETECTED NOT DETECTED   Cryptococcus neoformans/gattii NOT DETECTED NOT DETECTED   Meth resistant mecA/C and MREJ NOT DETECTED NOT DETECTED    Sherlon Handing, PharmD, BCPS Please see amion for complete clinical pharmacist phone list 10/19/2019  11:47 PM

## 2019-10-19 NOTE — ED Notes (Signed)
Pt made aware that urine sample is needed.

## 2019-10-19 NOTE — ED Notes (Signed)
Pt cursing in hallway bed. Verbalizing how miserable she is. April RN spoke to her calmly and escorted her to the restroom. Pt back in bed at this time.

## 2019-10-20 ENCOUNTER — Inpatient Hospital Stay (HOSPITAL_COMMUNITY): Payer: Self-pay

## 2019-10-20 DIAGNOSIS — M25511 Pain in right shoulder: Secondary | ICD-10-CM | POA: Diagnosis present

## 2019-10-20 DIAGNOSIS — R1110 Vomiting, unspecified: Secondary | ICD-10-CM

## 2019-10-20 DIAGNOSIS — R7881 Bacteremia: Secondary | ICD-10-CM

## 2019-10-20 DIAGNOSIS — B9561 Methicillin susceptible Staphylococcus aureus infection as the cause of diseases classified elsewhere: Secondary | ICD-10-CM

## 2019-10-20 DIAGNOSIS — E876 Hypokalemia: Secondary | ICD-10-CM

## 2019-10-20 DIAGNOSIS — I330 Acute and subacute infective endocarditis: Secondary | ICD-10-CM

## 2019-10-20 DIAGNOSIS — A4101 Sepsis due to Methicillin susceptible Staphylococcus aureus: Principal | ICD-10-CM

## 2019-10-20 DIAGNOSIS — I2690 Septic pulmonary embolism without acute cor pulmonale: Secondary | ICD-10-CM

## 2019-10-20 DIAGNOSIS — M009 Pyogenic arthritis, unspecified: Secondary | ICD-10-CM

## 2019-10-20 LAB — BASIC METABOLIC PANEL
Anion gap: 13 (ref 5–15)
BUN: 19 mg/dL (ref 6–20)
CO2: 19 mmol/L — ABNORMAL LOW (ref 22–32)
Calcium: 8.9 mg/dL (ref 8.9–10.3)
Chloride: 103 mmol/L (ref 98–111)
Creatinine, Ser: 0.81 mg/dL (ref 0.44–1.00)
GFR calc Af Amer: >60 mL/min (ref >60)
GFR calc non Af Amer: >60 mL/min (ref >60)
Glucose, Bld: 114 mg/dL — ABNORMAL HIGH (ref 70–99)
Potassium: 2.9 mmol/L — ABNORMAL LOW (ref 3.5–5.1)
Sodium: 135 mmol/L (ref 135–145)

## 2019-10-20 LAB — CBC
HCT: 28.4 % — ABNORMAL LOW (ref 36.0–46.0)
Hemoglobin: 9.6 g/dL — ABNORMAL LOW (ref 12.0–15.0)
MCH: 28.5 pg (ref 26.0–34.0)
MCHC: 33.8 g/dL (ref 30.0–36.0)
MCV: 84.3 fL (ref 80.0–100.0)
Platelets: 212 10*3/uL (ref 150–400)
RBC: 3.37 MIL/uL — ABNORMAL LOW (ref 3.87–5.11)
RDW: 13.8 % (ref 11.5–15.5)
WBC: 24.9 10*3/uL — ABNORMAL HIGH (ref 4.0–10.5)
nRBC: 0 % (ref 0.0–0.2)

## 2019-10-20 LAB — HIV ANTIBODY (ROUTINE TESTING W REFLEX): HIV Screen 4th Generation wRfx: NONREACTIVE

## 2019-10-20 MED ORDER — ESCITALOPRAM OXALATE 10 MG PO TABS
30.0000 mg | ORAL_TABLET | Freq: Every day | ORAL | Status: DC
  Administered 2019-10-20 – 2019-10-29 (×10): 30 mg via ORAL
  Filled 2019-10-20 (×12): qty 3

## 2019-10-20 MED ORDER — ENOXAPARIN SODIUM 40 MG/0.4ML ~~LOC~~ SOLN
40.0000 mg | SUBCUTANEOUS | Status: DC
  Administered 2019-10-20 – 2019-10-22 (×3): 40 mg via SUBCUTANEOUS
  Filled 2019-10-20 (×3): qty 0.4

## 2019-10-20 MED ORDER — ONDANSETRON HCL 4 MG/2ML IJ SOLN
4.0000 mg | Freq: Four times a day (QID) | INTRAMUSCULAR | Status: DC | PRN
  Administered 2019-10-20 – 2019-10-21 (×3): 4 mg via INTRAVENOUS
  Filled 2019-10-20 (×3): qty 2

## 2019-10-20 MED ORDER — POTASSIUM CHLORIDE CRYS ER 20 MEQ PO TBCR
40.0000 meq | EXTENDED_RELEASE_TABLET | Freq: Two times a day (BID) | ORAL | Status: AC
  Administered 2019-10-20 (×2): 40 meq via ORAL
  Filled 2019-10-20 (×3): qty 2

## 2019-10-20 MED ORDER — POTASSIUM CHLORIDE 10 MEQ/100ML IV SOLN
10.0000 meq | INTRAVENOUS | Status: DC
  Administered 2019-10-20: 10 meq via INTRAVENOUS
  Filled 2019-10-20 (×3): qty 100

## 2019-10-20 NOTE — Consult Note (Signed)
[facility] for Infectious Disease    Date of Admission:  10/18/2019     Total days of antibiotics 2  Day 1 Cefazolin         Reason for Consult: MSSA bacteremia     Referring Provider: CHAMP  Primary Care Provider: Mancel Bale, PA-C   Assessment: Suzanne [name] is a 29 y.o. female with history of current injection drug use and 5 days of febrile-illness. 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 She has been narrowed to appropriate therapy on Cefazolin.  Will repeat blood cultures tomorrow AM.  She will need a TEE arranged to examine heart valves.  Hold on any central lines if possible for now until blood clears.  Will need evaluation of R shoulder - would prefer arthrocentesis with cell count and culture but MRI would probably be helpful with surgical planning.   Opioid dependence = appreciate primary team efforts to medicate for acute pain  ?adding IV Toradol short term for pain relief     IV drug use = check HIV and Hepatitis C if not done recently.    Plan: 1. Continue cefazolin  2. Blood cultures in AM 3. Please consult cardiology for TEE  4. Hold on central line 5. MRI vs arthrocentesis R shoulder when patient pain more controlled  6. HIV, Hep B/C     Principal Problem:   MSSA bacteremia Active Problems:   Shortness of breath   Sepsis ([place])   Right shoulder pain   Acute septic pulmonary embolism ([place])   . diclofenac Sodium  4 g Topical QID  . enoxaparin (LOVENOX) injection  40 mg Subcutaneous Q24H  . escitalopram  30 mg Oral Daily  . gabapentin  400 mg Oral QHS    HPI: Suzanne [name] is a 29 y.o. female here for evaluation of 4 day history of central chest pain and right arm pain, nausea and shortness of breath.   Chest pain was described to be persistently worsening but at the time of exam today her shoulder pain was more severe. She  has no history of any trauma to the right shoulder that would have caused pain. States she cannot lift the elbow above shoulder level. She has had some chills/sweating at home as well with nausea. She does smoke cigarettes and uses intravenous drugs including heroin.  COVID19 negative.  Chest imaging reveals concern for bilateral septic emboli, somewhat dilated pulmonary artery. WBC 21.8K, BNP nearly 400, Troponin 33.  Pain is currently too limiting to do much with full exam. Denies any back pain, headaches, or skin abscesses. No abdominal pain.   Shares needles with her current boyfriend.    Review of Systems: Review of Systems  Constitutional: Positive for chills, diaphoresis, fever and malaise/fatigue.  Respiratory: Positive for cough and shortness of breath.   Cardiovascular: Positive for chest pain. Negative for palpitations and leg swelling.  Gastrointestinal: Positive for nausea. Negative for abdominal pain, diarrhea and vomiting.  Genitourinary: Negative for dysuria.  Musculoskeletal: Positive for joint pain (R shoulder ). Negative for back pain.  Skin: Negative for rash.  Neurological: Negative for dizziness, focal weakness and headaches.    Past Medical History:  Diagnosis Date  . 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 Depression   . Lipoma of neck 04/2017   posterior  . Scoliosis    idiopathic   .  Sepsis ([place])   . WPW (Wolff-Parkinson-White syndrome)     Social History   Tobacco Use  . Smoking status: Current Every Day Smoker    Packs/day: 0.00    Years: 7.00    Pack years: 0.00    Types: Cigarettes  . Smokeless tobacco: Never Used  . Tobacco comment: 1 cig./day  Vaping Use  . Vaping Use: Every day  Substance Use Topics  . Alcohol use: Yes    Alcohol/week: 1.0 standard drink    Types: 1 Glasses of wine per week    Comment: 1 glass wine/day  . Drug use: Yes    Types: Marijuana, IV, Heroin    Comment: last used 04/13/2017    Family  History  Problem Relation Age of Onset  . Other Brother        substance abuse  . Hyperlipidemia Maternal Grandfather   . Anxiety disorder Maternal Grandfather   . Alcoholism Maternal Grandfather   . Anxiety disorder Mother   . ADD / ADHD Father   . Drug abuse Father   . ADD / ADHD Brother   . Other Brother        bone marrow transplant  . Breast cancer Paternal Grandmother   . Aplastic anemia Brother   . Hypertension Maternal Grandmother   . Alcoholism Maternal Grandmother   . Diabetes Paternal Grandfather   . Cancer Paternal Grandfather   . Arrhythmia Brother   . Alcoholism Other   . Alcoholism Maternal Uncle    No Known Allergies  OBJECTIVE: Blood pressure 129/90, pulse 68, temperature 98.8 F (37.1 C), temperature source Oral, resp. rate 18, weight 57.5 kg, last menstrual period 10/11/2019, SpO2 100 %.  Physical Exam Constitutional:      Appearance: She is well-developed.     Comments: Curled up on left side. Does rotate supine briefly for exam and pain medication. Appears uncomfortable.   Cardiovascular:     Rate and Rhythm: Regular rhythm. Tachycardia present.     Heart sounds: Murmur heard.  Systolic murmur is present with a grade of 1/6.   Pulmonary:     Effort: Pulmonary effort is normal. No tachypnea or respiratory distress.     Breath sounds: Normal breath sounds.  Abdominal:     General: Bowel sounds are normal.  Musculoskeletal:     Comments: R shoulder pain but unwilling to participate in exam. No TTP while applying topical volteren  Skin:    Capillary Refill: Capillary refill takes less than 2 seconds.  Neurological:     Mental Status: She is alert and oriented to person, place, and time.     Lab Results Lab Results  Component Value Date   WBC 24.9 (H) 10/20/2019   HGB 9.6 (L) 10/20/2019   HCT 28.4 (L) 10/20/2019   MCV 84.3 10/20/2019   PLT 212 10/20/2019    Lab Results  Component Value Date   CREATININE 0.81 10/20/2019   BUN 19  10/20/2019   NA 135 10/20/2019   K 2.9 (L) 10/20/2019   CL 103 10/20/2019   CO2 19 (L) 10/20/2019    Lab Results  Component Value Date   ALT 24 09/13/2016   AST 50 (H) 09/13/2016   ALKPHOS 100 09/13/2016   BILITOT <0.2 09/13/2016     Microbiology: Recent Results (from the past 240 hour(s))  SARS Coronavirus 2 by RT PCR (hospital order, performed in [place] hospital lab) Nasopharyngeal Nasopharyngeal Swab     Status: None   Collection Time: 10/19/19  7:02 AM   Specimen: Nasopharyngeal Swab  Result Value Ref Range Status   SARS Coronavirus 2 NEGATIVE NEGATIVE Final    Comment: (NOTE) SARS-CoV-2 target nucleic acids are NOT DETECTED.  The SARS-CoV-2 RNA is generally detectable in upper and lower respiratory specimens during the acute phase of infection. The lowest concentration of SARS-CoV-2 viral copies this assay can detect is 250 copies / mL. A negative result does not preclude SARS-CoV-2 infection and should not be used as the sole basis for treatment or other patient management decisions.  A negative result may occur with improper specimen collection / handling, submission of specimen other than nasopharyngeal swab, presence of viral mutation(s) within the areas targeted by this assay, and inadequate number of viral copies (<250 copies / mL). A negative result must be combined with clinical observations, patient history, and epidemiological information.  Fact Sheet for Patients:   StrictlyIdeas.no  Fact Sheet for Healthcare Providers: BankingDealers.co.za  This test is not yet approved or  cleared by the [place] FDA and has been authorized for detection and/or diagnosis of SARS-CoV-2 by FDA under an Emergency Use Authorization (EUA).  This EUA will remain in effect (meaning this test can be used) for the duration of the COVID-19 declaration under Section 564(b)(1) of the Act, 21 U.S.C. section 360bbb-3(b)(1),  unless the authorization is terminated or revoked sooner.  Performed at [facility], [place] [address], [place], [place] [address]   Blood culture (routine x 2)     Status: Abnormal (Preliminary result)   Collection Time: 10/19/19  9:57 AM   Specimen: BLOOD RIGHT HAND  Result Value Ref Range Status   Specimen Description BLOOD RIGHT HAND  Final   Special Requests   Final    BOTTLES DRAWN AEROBIC ONLY Blood Culture results may not be optimal due to an inadequate volume of blood received in culture bottles   Culture  Setup Time   Final    GRAM POSITIVE COCCI IN CLUSTERS AEROBIC BOTTLE ONLY CRITICAL RESULT CALLED TO, READ BACK BY AND VERIFIED WITH: PHARMD C AMEND 10/19/19 AT 2328 SK    Culture (A)  Final    STAPHYLOCOCCUS AUREUS SUSCEPTIBILITIES TO FOLLOW Performed at [facility], [place] [address], [place], [place] [address]    Report Status PENDING  Incomplete  Blood Culture ID Panel (Reflexed)     Status: Abnormal   Collection Time: 10/19/19  9:57 AM  Result Value Ref Range Status   Enterococcus faecalis NOT DETECTED NOT DETECTED Final   Enterococcus Faecium NOT DETECTED NOT DETECTED Final   Listeria monocytogenes NOT DETECTED NOT DETECTED Final   Staphylococcus species DETECTED (A) NOT DETECTED Final    Comment: CRITICAL RESULT CALLED TO, READ BACK BY AND VERIFIED WITH: PHARMD C AMEND 10/19/19 AT 2328 SK    Staphylococcus aureus (BCID) DETECTED (A) NOT DETECTED Final    Comment: CRITICAL RESULT CALLED TO, READ BACK BY AND VERIFIED WITH: PHARMD C AMEND 10/19/19 AT 2328 SK    Staphylococcus epidermidis NOT DETECTED NOT DETECTED Final   Staphylococcus lugdunensis NOT DETECTED NOT DETECTED Final   Streptococcus species NOT DETECTED NOT DETECTED Final   Streptococcus agalactiae NOT DETECTED NOT DETECTED Final   Streptococcus pneumoniae NOT DETECTED NOT DETECTED Final   Streptococcus pyogenes NOT DETECTED NOT DETECTED Final   A.calcoaceticus-baumannii NOT DETECTED NOT  DETECTED Final   Bacteroides fragilis NOT DETECTED NOT DETECTED Final   Enterobacterales NOT DETECTED NOT DETECTED Final   Enterobacter cloacae complex NOT DETECTED NOT  DETECTED Final   Escherichia coli NOT DETECTED NOT DETECTED Final   Klebsiella aerogenes NOT DETECTED NOT DETECTED Final   Klebsiella oxytoca NOT DETECTED NOT DETECTED Final   Klebsiella pneumoniae NOT DETECTED NOT DETECTED Final   Proteus species NOT DETECTED NOT DETECTED Final   Salmonella species NOT DETECTED NOT DETECTED Final   Serratia marcescens NOT DETECTED NOT DETECTED Final   Haemophilus influenzae NOT DETECTED NOT DETECTED Final   Neisseria meningitidis NOT DETECTED NOT DETECTED Final   Pseudomonas aeruginosa NOT DETECTED NOT DETECTED Final   Stenotrophomonas maltophilia NOT DETECTED NOT DETECTED Final   Candida albicans NOT DETECTED NOT DETECTED Final   Candida auris NOT DETECTED NOT DETECTED Final   Candida glabrata NOT DETECTED NOT DETECTED Final   Candida krusei NOT DETECTED NOT DETECTED Final   Candida parapsilosis NOT DETECTED NOT DETECTED Final   Candida tropicalis NOT DETECTED NOT DETECTED Final   Cryptococcus neoformans/gattii NOT DETECTED NOT DETECTED Final   Meth resistant mecA/C and MREJ NOT DETECTED NOT DETECTED Final    Comment: Performed at [facility], [place] [address] [place], [place] [address]  Blood culture (routine x 2)     Status: Abnormal (Preliminary result)   Collection Time: 10/19/19 10:07 AM   Specimen: BLOOD RIGHT ARM  Result Value Ref Range Status   Specimen Description BLOOD RIGHT ARM  Final   Special Requests   Final    BOTTLES DRAWN AEROBIC AND ANAEROBIC Blood Culture adequate volume   Culture  Setup Time   Final    GRAM POSITIVE COCCI IN CLUSTERS IN BOTH AEROBIC AND ANAEROBIC BOTTLES CRITICAL VALUE NOTED.  VALUE IS CONSISTENT WITH PREVIOUSLY REPORTED AND CALLED VALUE. Performed at [facility], [place] [address] [place], [place] [address]    Culture  STAPHYLOCOCCUS AUREUS (A)  Final   Report Status PENDING  Incomplete    Janene Madeira, MSN, NP-C [facility] for Infectious Disease [email or web address] Pager: [phone] Office: [place]: [phone]   10/20/2019 11:06 AM

## 2019-10-20 NOTE — Progress Notes (Signed)
Updated Mother regarding patient condition. Patient does not want to talk to anybody at present. Told patient her mother wants to talk to her. Back pain of 10/10. Pain med given. Come back in four hours when my pain med is due.

## 2019-10-20 NOTE — Progress Notes (Signed)
Patient reported that her arm was burning at the IV site where she was receiving potassium. Patient also reported that her arm was hurting very bad to remove the medication. RN removed the medication and notified the MD, no new orders have been placed. RN will continue to monitor the patient, as the patient has refused further administration of this medication.

## 2019-10-20 NOTE — Hospital Course (Addendum)
Patient with new rectal bleeding this morning after TEE, bedside commode with dark red fluid. Also had 2 episodes of diarrhea last night, she is unsure if there was blood in it. She shrugs in response to query about pain control, she has been getting her IV morphine now after we spoke with nursing staff yesterday. Reports continued R shoulder pain, but will not elaborate or allow [place] to examine it. Agreeable to MRI in the future but not today. Denies CP, SOB.  Patient had multiple, acute episodes of bloody maroonish-brown diarrhea 10/22/19. Hemoglobin dropped to 4.3 with unstable vital signs and she was given 3 units of PRBC and LR boluses until tachycardia resolved. On 9/12, hemoglobin dropped from 8.9 to 6.7 and patient received 1 unit PRBCs. C. Diff testing negative. EGD 10/23/19 showed L:A Grade A esophagitis, small hiatal hernia, three 6-7-75mm non-bleeding gastric ulcers, and many superficial duodenal ulcers in the duodenal bulb without any signs of active bleeding, likely 2/2 frequent NSAID use. Bx showed reactive gastropathy, negative for H. Pylori. Hemoglobin now stable 8.2.  ******************************************************************* MSSA bacteremia with TV endocarditis, septic pulmonary emboli, suspected septic R shoulder, and R chest wall infection;  *** Patient presented with 5 days of CP, SOB, fever, with hx IVDU and septic emboli on CTA chest.  Blood cultures positive for MSSA. TTE performed 10/21/19 unremarkable. 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 Soft tissue ultrasound of the chest today showed a lenticular hypoechoic mass within the muscles anterior to the sternum ([id number]) with surrounding hypervascularity, most consistent with a solid lesion such as fibroma, less likely hematoma or infection, although no drainable fluid collection was present. HIV, HepBsAg and HCV Ab  negative. Repeat blood cultures drawn 10/22/19 resulted in no growth. - ID following. Grateful for their recommendations. - Plan is for discharge with linezolid and follow up with infectious disease outpatient; will need to discontinue lexapro and dexamphetamine if started on linezolid given risk of Serotonin syndrome - Patient not a candidate for angiovac procedure of TV due to positioning of vegetation and need for heparinization following procedure, per Dr. Kipp Brood / CT surgery  - Will give 4 day course of Oxycodone 5mg  q4 hours PRN for home and instruct patient to stop taking these medications the evening prior to her scheduled suboxone appointment at [facility] 11/02/19.  Hx Heroine Use Disorder ***   Upper GI Bleed 2/2 NSAID Use During admission, patient noted maroonish-brown diarrheal episodes. Later that day had worsening episodes of bloody bowel movements. Hgb drop from 9.2 to 4.3. Transfused 3 units of blood, hgb improved to 8.9. Next morning dropped again to 6.7, another unit transfused. EGD on 9/11 which demonstrated non-bleeding gastric and duodenal ulcers, most likely 2/2 NSAID use. Hgb remained stable for rest of admission. Counseled on avoiding NSAIDs.  Major Depressive Disorder Patient is on Lexapro at home. Stated "I just want to die" early in admission, but no SI. Patient interested in establishing counseling outpatient and encouraged PCP follow up. Discontinued Lexapro on discharge for possibility of serotonin syndrome with concurrent Linezolid.

## 2019-10-20 NOTE — Progress Notes (Signed)
Patient stated to RN that she wanted to die, MD has been notified, emotional support has been given to patient. RN will continue to monitor patient.

## 2019-10-20 NOTE — Progress Notes (Signed)
Patient refused to complete echocardiogram, RN notified MD and patient was educated. RN will continue to monitor patient.

## 2019-10-20 NOTE — Progress Notes (Signed)
Patient doesn't want to do echo right now because they didn't get enough sleep last nite, per patient

## 2019-10-20 NOTE — Progress Notes (Signed)
Pt refused night time meds stated she was throwing up in trash can.RN did not observe any vomit in trash can. Pt request pain med, RN ask pt if she could keep the pain med down since she was throwing up,pt stated she could,explain to pt I will bring her night time med Neurontin, pt stated she has her pills in the room and already took her neurontin explain to pt that she cannot take her own pills that I have to take it to pharmacy.Pt refused to give her meds to RN ,pt stated leave me alone will cont to monitor.

## 2019-10-20 NOTE — Progress Notes (Signed)
Subjective:  Suzanne [name] reports feeling unwell this morning. She has been vomiting since last night and reports poor sleep. She continues to have pain including pain of her right shoulder. She denies SOB, CP, and says her sternal pain has resolved. Her significant other in the room complains of night sweats for 2 weeks and intermittent fevers and chills himself, requiring 4 weeks of ABX treatment. Both he and patient are IV drug users and share needles. She states "I don't want y'all keeping me here forever." She refused heparin injection last night, but after a discussion of benefits, is agreeable to restarting anti-coagulation therapy. When the topic of future suboxone/methadone treatment was broached, she states "I don't want any of that shit."  Objective:  Vital signs in last 24 hours: Vitals:   10/19/19 1702 10/19/19 2113 10/20/19 0509 10/20/19 1017  BP:  127/82 130/90 129/90  Pulse:  (!) 108 96 68  Resp:  17 18 18   Temp:  97.8 F (36.6 C) 98.8 F (37.1 C) 98.8 F (37.1 C)  TempSrc:    Oral  SpO2:  100% 99% 100%  Weight: 57.5 kg      Weight change:   Intake/Output Summary (Last 24 hours) at 10/20/2019 1124 Last data filed at 10/20/2019 0509 Gross per 24 hour  Intake 340 ml  Output 0 ml  Net 340 ml   Physical Exam Vitals and nursing note reviewed.  Cardiovascular:     Rate and Rhythm: Normal rate and regular rhythm.     Heart sounds: Normal heart sounds. No murmur heard.   Pulmonary:     Effort: Pulmonary effort is normal.     Breath sounds: Normal breath sounds.  Chest:     Chest wall: No tenderness.  Musculoskeletal:     Comments: R shoulder warm to the touch.  Patient refused to demonstrate ROM or permit further exam.    General: Patient is in acute distress secondary to active vomiting. Skin: No erythema or edema  Assessment/Plan:  Principal Problem:   MSSA bacteremia Active Problems:   Shortness of breath   Sepsis ([place])   Right shoulder pain   Acute  septic pulmonary embolism (HCC)  MSSA bacteremia with presumed bacterial endocarditis: Patient presented with 5 days of CP, SOB, fever, with hx IVDU and septic emboli on CTA chest, concerning for endocarditis. Blood cultures positive for MSSA. WBC 21.8 --> 24.9.  - Continue IV Ancef  - ID on board, appreciate their recommendations; TEE being scheduled for Friday - HIV, Hep B/C pending.  - Daily CBC - Oxy IR and morphine PRN for pain - Repeat blood cultures tomorrow  R Shoulder Pain with concern for Septic Joint: Pain is slightly improved since yesterday, but range of motion remains severely limited. Joint is warm to touch, but patient refused further exam.  - Will attempt to obtain arthrocentesis vs. MRI if patient allows it  Vomiting: Vomiting likely due to Zosyn/Vancomycin given overnight vs. infectious.  - will monitor for improvement on Ancef - Ondansetron PRN   Hypokalemia: K of 2.9 on BMP today. Patient did not tolerate IV KCl repletion due to burning.  - Will try PO potassium supplementation - repeat BMP  IV Heroin Use: Patient remains pre-contemplative for substance abuse treatment.   - Consult social work   MDD: Patient is on Lexapro and dexamphetamine at home.  - Continue home medications - Will discuss SI during admission  Prior to Admission Living Arrangement: Home  Anticipated Discharge Location: Home Barriers to  Discharge: IV ABX / likely endocarditis  Code Status: Full Code Diet: Heart Healthy Diet IVF: None DVT PPx: Switched heparin to Lovenox for once daily dosing.    LOS: 1 day   Suzanne [name], Medical Student 10/20/2019, 11:24 AM

## 2019-10-21 ENCOUNTER — Inpatient Hospital Stay (HOSPITAL_COMMUNITY): Payer: Self-pay

## 2019-10-21 DIAGNOSIS — R079 Chest pain, unspecified: Secondary | ICD-10-CM

## 2019-10-21 DIAGNOSIS — R112 Nausea with vomiting, unspecified: Secondary | ICD-10-CM

## 2019-10-21 LAB — COMPREHENSIVE METABOLIC PANEL
ALT: 12 U/L (ref 0–44)
AST: 21 U/L (ref 15–41)
Albumin: 2.1 g/dL — ABNORMAL LOW (ref 3.5–5.0)
Alkaline Phosphatase: 156 U/L — ABNORMAL HIGH (ref 38–126)
Anion gap: 14 (ref 5–15)
BUN: 12 mg/dL (ref 6–20)
CO2: 19 mmol/L — ABNORMAL LOW (ref 22–32)
Calcium: 8.8 mg/dL — ABNORMAL LOW (ref 8.9–10.3)
Chloride: 103 mmol/L (ref 98–111)
Creatinine, Ser: 0.63 mg/dL (ref 0.44–1.00)
GFR calc Af Amer: >60 mL/min (ref >60)
GFR calc non Af Amer: >60 mL/min (ref >60)
Glucose, Bld: 117 mg/dL — ABNORMAL HIGH (ref 70–99)
Potassium: 3.6 mmol/L (ref 3.5–5.1)
Sodium: 136 mmol/L (ref 135–145)
Total Bilirubin: 0.2 mg/dL — ABNORMAL LOW (ref 0.3–1.2)
Total Protein: 6.1 g/dL — ABNORMAL LOW (ref 6.5–8.1)

## 2019-10-21 LAB — CULTURE, BLOOD (ROUTINE X 2): Special Requests: ADEQUATE

## 2019-10-21 LAB — ECHOCARDIOGRAM COMPLETE
Area-P 1/2: 3.97 cm2
S' Lateral: 3 cm
Weight: 2028.23 oz

## 2019-10-21 LAB — CBC
HCT: 27.6 % — ABNORMAL LOW (ref 36.0–46.0)
Hemoglobin: 9.2 g/dL — ABNORMAL LOW (ref 12.0–15.0)
MCH: 28.3 pg (ref 26.0–34.0)
MCHC: 33.3 g/dL (ref 30.0–36.0)
MCV: 84.9 fL (ref 80.0–100.0)
Platelets: 244 10*3/uL (ref 150–400)
RBC: 3.25 MIL/uL — ABNORMAL LOW (ref 3.87–5.11)
RDW: 13.6 % (ref 11.5–15.5)
WBC: 25.1 10*3/uL — ABNORMAL HIGH (ref 4.0–10.5)
nRBC: 0 % (ref 0.0–0.2)

## 2019-10-21 MED ORDER — OXYCODONE HCL 5 MG PO TABS
10.0000 mg | ORAL_TABLET | Freq: Four times a day (QID) | ORAL | Status: DC
  Administered 2019-10-21 – 2019-10-27 (×20): 10 mg via ORAL
  Filled 2019-10-21 (×21): qty 2

## 2019-10-21 MED ORDER — ONDANSETRON HCL 4 MG/2ML IJ SOLN
4.0000 mg | Freq: Four times a day (QID) | INTRAMUSCULAR | Status: DC | PRN
  Administered 2019-10-21: 4 mg via INTRAVENOUS
  Filled 2019-10-21: qty 2

## 2019-10-21 MED ORDER — ONDANSETRON HCL 4 MG/2ML IJ SOLN: 4.0000 mg | Freq: Four times a day (QID) | INTRAMUSCULAR | Status: DC

## 2019-10-21 MED ORDER — ONDANSETRON HCL 4 MG/2ML IJ SOLN
4.0000 mg | Freq: Four times a day (QID) | INTRAMUSCULAR | Status: DC
  Administered 2019-10-21: 4 mg via INTRAVENOUS
  Filled 2019-10-21: qty 2

## 2019-10-21 MED ORDER — SODIUM CHLORIDE 0.9 % IV SOLN: INTRAVENOUS | Status: DC

## 2019-10-21 MED ORDER — SCOPOLAMINE 1 MG/3DAYS TD PT72
1.0000 | MEDICATED_PATCH | TRANSDERMAL | Status: DC
  Administered 2019-10-21 – 2019-10-24 (×2): 1.5 mg via TRANSDERMAL
  Filled 2019-10-21 (×3): qty 1

## 2019-10-21 MED ORDER — ONDANSETRON HCL 4 MG/2ML IJ SOLN: 4.0000 mg | Freq: Four times a day (QID) | INTRAMUSCULAR | Status: DC | PRN

## 2019-10-21 NOTE — Progress Notes (Signed)
Subjective:  Patient complaining of 10/10 back pain, has received her oxycodone but not her IV morphine which has been ordered since 2 days ago. She states "I want to die," and that this feeling is due to pain and her expected prolonged hospital stay. Patient agrees to 1 blood draw later today for blood culture, and TEE tomorrow. States that her shoulder pain is the same as yesterday, refuses physical exam today but agrees to MRI, but not today or tomorrow. She reports continued severe nausea and vomiting with last vomiting episode "a few minutes ago."  Objective:  Vital signs in last 24 hours: Vitals:   10/20/19 1630 10/20/19 2059 10/21/19 0434 10/21/19 0913  BP: (!) 133/91 128/90 (!) 133/98 139/87  Pulse: 85 83 93   Resp: 18 17 18 18   Temp: 97.8 F (36.6 C) 97.9 F (36.6 C) 98.4 F (36.9 C) 97.8 F (36.6 C)  TempSrc: Oral Oral  Oral  SpO2: 99% 98% 99%   Weight:       Weight change:   Intake/Output Summary (Last 24 hours) at 10/21/2019 1120 Last data filed at 10/21/2019 0914 Gross per 24 hour  Intake 1864.64 ml  Output 0 ml  Net 1864.64 ml   Physical Exam Vitals and nursing note reviewed.  Constitutional:      Comments: Patient uncomfortable-appearing.   Cardiovascular:     Rate and Rhythm: Normal rate and regular rhythm.     Heart sounds: No murmur heard.   Pulmonary:     Effort: Pulmonary effort is normal. No tachypnea or respiratory distress.     Breath sounds: Normal breath sounds.  Musculoskeletal:     Comments: R shoulder remains slightly warm to the touch, but is non-erythematous.  Patient refuses to demonstrate ROM or further exam.      Assessment/Plan:  Principal Problem:   MSSA bacteremia Active Problems:   Shortness of breath   Sepsis ([place])   Right shoulder pain   Acute septic pulmonary embolism (HCC)   MSSA bacteremia with presumed bacterial endocarditis: Patient presented with 5 days of CP, SOB, fever, with hx IVDU and septic emboli on CTA  chest, concerning for endocarditis. Blood cultures positive for MSSA. Day 3 of IV abx.  - Continue IV Ancef  - TEE tomorrow - TTE done this morning; pending - Repeat blood culture to be drawn today - HIV, Hep B/C pending.  - Daily CBC - Oxy IR 10mg  q6 for pain - Morphine 4-6 mg IV q3 PRN for breakthrough pain  R Shoulder Pain with concern for Septic Joint: Pain is slightly improved since yesterday, but range of motion remains severely limited. Joint is warm to touch, but patient refused further exam. Patient refused MRI today or tomorrow, but open to it in future.  - Hopeful for MRI as soon as possible - Pain control as above  Nausea/Vomiting: Continued n/v with Zofran at irregular intervals.  - Will schedule Zofran 4 mg q6  MDD: Patient is on Lexapro at home. Expressing passive SI likely 2/2 pain and anxiety. - Continue home medications - Continue to monitor mood/SI  IV Heroin Use: Patient remains pre-contemplative for substance abuse treatment.   - Encouraged nursing staff to provide morphine as needed for breakthrough pain  Prior to Admission Living Arrangement: Home  Anticipated Discharge Location: Home Barriers to Discharge: IV ABX / likely endocarditis  Code Status: Full Code Diet:Heart Healthy Diet IVF: None DVT PPx: Lovenox   LOS: 2 days   Pearla Dubonnet, Medical Student  10/21/2019, 11:20 AM

## 2019-10-21 NOTE — H&P (View-Only) (Signed)
Subjective:  Patient complaining of 10/10 back pain, has received her oxycodone but not her IV morphine which has been ordered since 2 days ago. She states "I want to die," and that this feeling is due to pain and her expected prolonged hospital stay. Patient agrees to 1 blood draw later today for blood culture, and TEE tomorrow. States that her shoulder pain is the same as yesterday, refuses physical exam today but agrees to MRI, but not today or tomorrow. She reports continued severe nausea and vomiting with last vomiting episode "a few minutes ago."  Objective:  Vital signs in last 24 hours: Vitals:   10/20/19 1630 10/20/19 2059 10/21/19 0434 10/21/19 0913  BP: (!) 133/91 128/90 (!) 133/98 139/87  Pulse: 85 83 93   Resp: 18 17 18 18   Temp: 97.8 F (36.6 C) 97.9 F (36.6 C) 98.4 F (36.9 C) 97.8 F (36.6 C)  TempSrc: Oral Oral  Oral  SpO2: 99% 98% 99%   Weight:       Weight change:   Intake/Output Summary (Last 24 hours) at 10/21/2019 1120 Last data filed at 10/21/2019 0914 Gross per 24 hour  Intake 1864.64 ml  Output 0 ml  Net 1864.64 ml   Physical Exam Vitals and nursing note reviewed.  Constitutional:      Comments: Patient uncomfortable-appearing.   Cardiovascular:     Rate and Rhythm: Normal rate and regular rhythm.     Heart sounds: No murmur heard.   Pulmonary:     Effort: Pulmonary effort is normal. No tachypnea or respiratory distress.     Breath sounds: Normal breath sounds.  Musculoskeletal:     Comments: R shoulder remains slightly warm to the touch, but is non-erythematous.  Patient refuses to demonstrate ROM or further exam.      Assessment/Plan:  Principal Problem:   MSSA bacteremia Active Problems:   Shortness of breath   Sepsis ([place])   Right shoulder pain   Acute septic pulmonary embolism (HCC)   MSSA bacteremia with presumed bacterial endocarditis: Patient presented with 5 days of CP, SOB, fever, with hx IVDU and septic emboli on CTA  chest, concerning for endocarditis. Blood cultures positive for MSSA. Day 3 of IV abx.  - Continue IV Ancef  - TEE tomorrow - TTE done this morning; pending - Repeat blood culture to be drawn today - HIV, Hep B/C pending.  - Daily CBC - Oxy IR 10mg  q6 for pain - Morphine 4-6 mg IV q3 PRN for breakthrough pain  R Shoulder Pain with concern for Septic Joint: Pain is slightly improved since yesterday, but range of motion remains severely limited. Joint is warm to touch, but patient refused further exam. Patient refused MRI today or tomorrow, but open to it in future.  - Hopeful for MRI as soon as possible - Pain control as above  Nausea/Vomiting: Continued n/v with Zofran at irregular intervals.  - Will schedule Zofran 4 mg q6  MDD: Patient is on Lexapro at home. Expressing passive SI likely 2/2 pain and anxiety. - Continue home medications - Continue to monitor mood/SI  IV Heroin Use: Patient remains pre-contemplative for substance abuse treatment.   - Encouraged nursing staff to provide morphine as needed for breakthrough pain  Prior to Admission Living Arrangement: Home  Anticipated Discharge Location: Home Barriers to Discharge: IV ABX / likely endocarditis  Code Status: Full Code Diet:Heart Healthy Diet IVF: None DVT PPx: Lovenox   LOS: 2 days   Pearla Dubonnet, Medical Student  10/21/2019, 11:20 AM

## 2019-10-21 NOTE — Progress Notes (Signed)
   [facility] has been requested to perform a transesophageal echocardiogram on Suzanne [name] for bacteremia and IV drug use.  After careful review of history and examination, the risks and benefits of transesophageal echocardiogram have been explained including risks of esophageal damage, perforation (1:10,000 risk), bleeding, pharyngeal hematoma as well as other potential complications associated with conscious sedation including aspiration, arrhythmia, respiratory failure and death. Alternatives to treatment were discussed, questions were answered. Patient is willing to proceed.   Procedure scheduled for 10/22/2019 at 10:30am with Dr. Marlou Porch. Orders placed.  Darreld Mclean, PA-C 10/21/2019 4:01 PM

## 2019-10-21 NOTE — Progress Notes (Signed)
Pt refused AM labs states she is "trying to sleep" agreed to try again later today. MD notified.

## 2019-10-21 NOTE — Plan of Care (Signed)
Problem: Education: °Goal: Knowledge of General Education information will improve °Description: Including pain rating scale, medication(s)/side effects and non-pharmacologic comfort measures °Outcome: Completed/Met °  °

## 2019-10-21 NOTE — Progress Notes (Signed)
  Echocardiogram 2D Echocardiogram has been performed.  Suzanne [name] G Suzanne [name] 10/21/2019, 9:15 AM

## 2019-10-21 NOTE — Plan of Care (Signed)
  Problem: Pain Managment: Goal: General experience of comfort will improve Outcome: Not Progressing   

## 2019-10-22 ENCOUNTER — Inpatient Hospital Stay (HOSPITAL_COMMUNITY): Payer: Self-pay

## 2019-10-22 ENCOUNTER — Inpatient Hospital Stay (HOSPITAL_COMMUNITY): Payer: Self-pay | Admitting: Anesthesiology

## 2019-10-22 ENCOUNTER — Encounter (HOSPITAL_COMMUNITY)
Admission: EM | Disposition: A | Discharge status: Home / Self Care | Payer: Self-pay | Source: Home / Self Care | Attending: Internal Medicine

## 2019-10-22 ENCOUNTER — Encounter (HOSPITAL_COMMUNITY): Payer: Self-pay | Admitting: Internal Medicine

## 2019-10-22 DIAGNOSIS — I361 Nonrheumatic tricuspid (valve) insufficiency: Secondary | ICD-10-CM

## 2019-10-22 DIAGNOSIS — K921 Melena: Secondary | ICD-10-CM

## 2019-10-22 DIAGNOSIS — I079 Rheumatic tricuspid valve disease, unspecified: Secondary | ICD-10-CM | POA: Diagnosis present

## 2019-10-22 DIAGNOSIS — R195 Other fecal abnormalities: Secondary | ICD-10-CM

## 2019-10-22 DIAGNOSIS — R197 Diarrhea, unspecified: Secondary | ICD-10-CM

## 2019-10-22 DIAGNOSIS — I339 Acute and subacute endocarditis, unspecified: Secondary | ICD-10-CM

## 2019-10-22 DIAGNOSIS — D62 Acute posthemorrhagic anemia: Secondary | ICD-10-CM

## 2019-10-22 DIAGNOSIS — R7881 Bacteremia: Secondary | ICD-10-CM

## 2019-10-22 DIAGNOSIS — I38 Endocarditis, valve unspecified: Secondary | ICD-10-CM

## 2019-10-22 HISTORY — PX: TEE WITHOUT CARDIOVERSION: SHX5443

## 2019-10-22 LAB — COMPREHENSIVE METABOLIC PANEL
ALT: 15 U/L (ref 0–44)
AST: 22 U/L (ref 15–41)
Albumin: 1.9 g/dL — ABNORMAL LOW (ref 3.5–5.0)
Alkaline Phosphatase: 104 U/L (ref 38–126)
Anion gap: 10 (ref 5–15)
BUN: 13 mg/dL (ref 6–20)
CO2: 21 mmol/L — ABNORMAL LOW (ref 22–32)
Calcium: 8.1 mg/dL — ABNORMAL LOW (ref 8.9–10.3)
Chloride: 106 mmol/L (ref 98–111)
Creatinine, Ser: 0.48 mg/dL (ref 0.44–1.00)
GFR calc Af Amer: >60 mL/min (ref >60)
GFR calc non Af Amer: >60 mL/min (ref >60)
Glucose, Bld: 110 mg/dL — ABNORMAL HIGH (ref 70–99)
Potassium: 3.5 mmol/L (ref 3.5–5.1)
Sodium: 137 mmol/L (ref 135–145)
Total Bilirubin: 0.3 mg/dL (ref 0.3–1.2)
Total Protein: 5.3 g/dL — ABNORMAL LOW (ref 6.5–8.1)

## 2019-10-22 LAB — CBC
HCT: 13.6 % — ABNORMAL LOW (ref 36.0–46.0)
Hemoglobin: 4.3 g/dL — CL (ref 12.0–15.0)
MCH: 27.7 pg (ref 26.0–34.0)
MCHC: 31.6 g/dL (ref 30.0–36.0)
MCV: 87.7 fL (ref 80.0–100.0)
Platelets: 304 10*3/uL (ref 150–400)
RBC: 1.55 MIL/uL — ABNORMAL LOW (ref 3.87–5.11)
RDW: 14.1 % (ref 11.5–15.5)
WBC: 29.9 10*3/uL — ABNORMAL HIGH (ref 4.0–10.5)
nRBC: 0 % (ref 0.0–0.2)

## 2019-10-22 LAB — PREPARE RBC (CROSSMATCH)

## 2019-10-22 LAB — ABO/RH: ABO/RH(D): A NEG

## 2019-10-22 LAB — HEPATITIS B SURFACE ANTIGEN: Hepatitis B Surface Ag: NONREACTIVE

## 2019-10-22 SURGERY — ECHOCARDIOGRAM, TRANSESOPHAGEAL
Anesthesia: Monitor Anesthesia Care

## 2019-10-22 MED ORDER — PANTOPRAZOLE SODIUM 40 MG IV SOLR
40.0000 mg | Freq: Once | INTRAVENOUS | Status: AC
  Administered 2019-10-22: 40 mg via INTRAVENOUS
  Filled 2019-10-22: qty 40

## 2019-10-22 MED ORDER — MORPHINE SULFATE (PF) 4 MG/ML IV SOLN
4.0000 mg | INTRAVENOUS | Status: DC | PRN
  Administered 2019-10-22 – 2019-10-24 (×6): 4 mg via INTRAVENOUS
  Filled 2019-10-22 (×6): qty 1

## 2019-10-22 MED ORDER — LACTATED RINGERS IV BOLUS
1000.0000 mL | Freq: Once | INTRAVENOUS | Status: AC
  Administered 2019-10-22: 1000 mL via INTRAVENOUS

## 2019-10-22 MED ORDER — PHENYLEPHRINE 40 MCG/ML (10ML) SYRINGE FOR IV PUSH (FOR BLOOD PRESSURE SUPPORT)
PREFILLED_SYRINGE | INTRAVENOUS | Status: DC | PRN
  Administered 2019-10-22 (×2): 80 ug via INTRAVENOUS

## 2019-10-22 MED ORDER — LIDOCAINE 2% (20 MG/ML) 5 ML SYRINGE
INTRAMUSCULAR | Status: DC | PRN
  Administered 2019-10-22: 100 mg via INTRAVENOUS

## 2019-10-22 MED ORDER — LACTATED RINGERS IV BOLUS
1000.0000 mL | Freq: Once | INTRAVENOUS | Status: AC
  Administered 2019-10-23: 1000 mL via INTRAVENOUS

## 2019-10-22 MED ORDER — SODIUM CHLORIDE 0.9 % IV SOLN
80.0000 mg | Freq: Once | INTRAVENOUS | Status: AC
  Administered 2019-10-23: 80 mg via INTRAVENOUS
  Filled 2019-10-22: qty 80

## 2019-10-22 MED ORDER — PANTOPRAZOLE SODIUM 40 MG IV SOLR: 40.0000 mg | Freq: Two times a day (BID) | INTRAVENOUS | Status: DC

## 2019-10-22 MED ORDER — DEXMEDETOMIDINE (PRECEDEX) IN NS 20 MCG/5ML (4 MCG/ML) IV SYRINGE
PREFILLED_SYRINGE | INTRAVENOUS | Status: DC | PRN
  Administered 2019-10-22: 4 ug via INTRAVENOUS
  Administered 2019-10-22: 8 ug via INTRAVENOUS
  Administered 2019-10-22 (×2): 4 ug via INTRAVENOUS

## 2019-10-22 MED ORDER — SODIUM CHLORIDE 0.9% IV SOLUTION: Freq: Once | INTRAVENOUS | Status: DC

## 2019-10-22 MED ORDER — PROPOFOL 500 MG/50ML IV EMUL
INTRAVENOUS | Status: DC | PRN
  Administered 2019-10-22: 100 ug/kg/min via INTRAVENOUS

## 2019-10-22 MED ORDER — BUTAMBEN-TETRACAINE-BENZOCAINE 2-2-14 % EX AERO
INHALATION_SPRAY | CUTANEOUS | Status: DC | PRN
  Administered 2019-10-22: 2 via TOPICAL

## 2019-10-22 MED ORDER — SODIUM CHLORIDE 0.9 % IV BOLUS
1000.0000 mL | Freq: Once | INTRAVENOUS | Status: AC
  Administered 2019-10-22: 1000 mL via INTRAVENOUS

## 2019-10-22 MED ORDER — SODIUM CHLORIDE 0.9 % IV SOLN
8.0000 mg/h | INTRAVENOUS | Status: DC
  Administered 2019-10-23 – 2019-10-24 (×3): 8 mg/h via INTRAVENOUS
  Filled 2019-10-22 (×5): qty 80

## 2019-10-22 NOTE — Anesthesia Procedure Notes (Signed)
Procedure Name: MAC Date/Time: 10/22/2019 8:40 AM Performed by: Lieutenant Diego, CRNA Pre-anesthesia Checklist: Patient identified, Emergency Drugs available, Suction available, Patient being monitored and Timeout performed Patient Re-evaluated:Patient Re-evaluated prior to induction Oxygen Delivery Method: Simple face mask Preoxygenation: Pre-oxygenation with 100% oxygen Induction Type: IV induction

## 2019-10-22 NOTE — CV Procedure (Signed)
   Transesophageal Echocardiogram  Indications: Bacteremia  Time out performed  Anesthesia present, propofol  Findings:  Left Ventricle: Normal left ventricular ejection fraction 55 to 60%  Mitral Valve: Normal mitral valve, trivial mitral regurgitation  Aortic Valve: Normal aortic valve, no aortic regurgitation  Tricuspid Valve: There is a large 2 x 1.5 cm globular circumferential vegetation with a subsidiary elongated 2 cm mobile portion attached to the lateral aspect of the tricuspid valve apparatus cord in the right ventricle.  There is mild tricuspid regurgitation.  Left Atrium: Normal, no left atrial appendage thrombus   Impression: Tricuspid valve endocarditis.  Large 2 cm vegetation.  Candee Furbish, MD

## 2019-10-22 NOTE — Progress Notes (Signed)
Pt arrived fromn procedure to the unit. Large bloody BM with clots. Pt states she is unable to control bowel and is unaware when she has to have a BM. MD notified.

## 2019-10-22 NOTE — Progress Notes (Addendum)
IM Teaching Service - Updated Progress Note  Subjective:   Ms. Mower had an episode of watery stools this morning that appeared brown in color. She had a second stool earlier this afternoon that appeared more red in color. She endorses multiple episodes of diarrhea this morning / early afternoon. She denies any nausea, vomiting, or abdominal pain currently. She denies any other symptoms including light-headedness, dizziness, or shortness of breath. She denies any fevers or chills. Her boyfriend in the room states that she looks pale to him. She denies any history of GI bleeding, hemorrhoids, gastric ulcers, frequent alcohol use.   Objective:  Vital signs in last 24 hours: Vitals:   10/22/19 0930 10/22/19 0940 10/22/19 0950 10/22/19 1327  BP: (!) 96/47 117/65 (!) 124/92 128/79  Pulse: 72 72 (!) 43 (!) 143  Resp: (!) 26 (!) 30 (!) 24 20  Temp: 97.7 F (36.5 C)   97.6 F (36.4 C)  TempSrc: Temporal   Oral  SpO2: 100% 100% 100% 100%  Weight:       General: Patient looks uncomfortable but in no acute distress, laying in bed.  Eyes:  Respiratory: Lungs are CTA, bilaterally. Patient is tachypneic.  Cardiovascular: Rate is tachycardic in the 120's. Rhythm is regular. No murmurs, rubs, or gallops appreciated.  Abdominal: Abdomen is soft and non-distended with no TTP. No guarding or rebound. Digital Rectal Examination showed dark maroonish-brown stool with residual along gluteal cleft.  Skin:  Psychiatric: Flat affect.   Assessment/Plan:  Principal Problem:   MSSA bacteremia Active Problems:   Shortness of breath   Sepsis ([place])   Right shoulder pain   Acute septic pulmonary embolism ([place])  1. Active Gastrointestinal Bleeding  Patient developed diarrhea this morning and now has FOBT positive maroonish-brown stools in commode and on digital rectal exam. No abdominal TTP or continued vomiting. No history of GIB despite slight drop in hemoglobin from 11.1 to 9.2 yesterday. - GI consulted,  appreciate their recommendations - Initiated IV Protonix 40mg   - Ordered STAT CBC; phlebotomy had difficulty with access earlier today  2. 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  - Continue plan as per previous note - Zofran PRN for nausea with scopolamine patch, monitor for QTc prolongation  Addendum: - Patient given 1L NS and 1L LR - Order placed for vitals q30 mins given new HoTN, tachycardia, and tachypnea - GI Consulted: Patient refusing EGD.  - Will check for C. Diff  - Started on Protonix 40mg  IV BID  Jeralyn Bennett, MD 10/22/2019, 2:24 PM Pager: [phone] After 5pm on weekdays and 1pm on weekends: On Call pager [phone]

## 2019-10-22 NOTE — Progress Notes (Signed)
Present to place second PIV as requested.  Patient refusing at this time.

## 2019-10-22 NOTE — Progress Notes (Addendum)
Subjective:  Nursing staff report patient with new bloody diarrhea after TEE this morning. Bedside commode has brown-appearing watery stool in it during interview. Nursing staff report that toilet paper was bright red after wiping. The patient is unable to clarify whether she noted any blood in her stools. Also had 2 episodes of diarrhea last night. She denies any abdominal pain or history of GI bleeding. She reports improved nausea with Scopolomine patch. States "I don't know" when asked if she is having any more vomiting. She indicates that her pain is better controlled with IV morphine in addition to her oxycodone. She continues to endorse R shoulder pain, but states she "just flipped over", laying on her R side as we speak and will not allow [place] to further questions her. Continues to deny MRI today although is agreeable to have her blood drawn prior to possible CT surgery procedure. Denies CP, SOB.  Objective:  Vital signs in last 24 hours: Vitals:   10/22/19 0749 10/22/19 0930 10/22/19 0940 10/22/19 0950  BP: 139/86 (!) 96/47 117/65 (!) 124/92  Pulse: 87 72 72 (!) 43  Resp: 14 (!) 26 (!) 30 (!) 24  Temp: 98.4 F (36.9 C) 97.7 F (36.5 C)    TempSrc: Oral Temporal    SpO2: 99% 100% 100% 100%  Weight:       Weight change:   Intake/Output Summary (Last 24 hours) at 10/22/2019 1100 Last data filed at 10/22/2019 0907 Gross per 24 hour  Intake 997.93 ml  Output 0 ml  Net 997.93 ml   Physical Exam Vitals and nursing note reviewed.  Constitutional:      Comments: Patient uncomfortable-appearing and irritable.  Lying on R shoulder.  Cardiovascular:     Rate and Rhythm: Normal rate and regular rhythm.     Heart sounds: Normal heart sounds. No murmur heard.   Pulmonary:     Effort: Pulmonary effort is normal.     Breath sounds: Normal breath sounds.  Abdominal:     Comments: Patient refuses abdominal or rectal exam  Musculoskeletal:     Comments: Patient refuses R shoulder exam.     Assessment/Plan:  Principal Problem:   MSSA bacteremia Active Problems:   Shortness of breath   Sepsis ([place])   Right shoulder pain   Acute septic pulmonary embolism (HCC)  MSSA bacteremia with TV bacterial endocarditis: Patient presented with 5 days of CP, SOB, fever, with hx IVDU and septic emboli on CTA chest.  Blood cultures positive for MSSA. TTE performed 10/21/19 unremarkable. 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  - ID on board. Grateful for their recommendations. - Continue IV Ancef, Day 4 - Consult CT Surgery for possible Angiovac of large vegetation - Repeat blood culture to be drawn today - HIV negative, will check Hep B/C today - Daily CBC - Oxy IR 10mg  q6 for pain - Morphine 4-6 mg IV q3 PRN for breakthrough pain - Scopolamine patch x3 days for nausea with PRN Zofran  Possible GI Bleeding with Diarrhea: Stool in bedside commode very dark, difficult to assess for presence/absence of blood. Report of bright red blood on paper with wiping concerning for GI bleed of unknown etiology. Given her history of opioid use, there is a high suspicion for hemorrhoids that could be aggravated by diarrhea. Diarrhea likely secondary to opioid withdrawal, although may also be due to Ancef. Hemoglobin 11.1 --> 9.2  yesterday. - CBC today pending. Phlebotomy called to expedite draw.  - Patient refuses abdominal/rectal exam.   R Shoulder Pain with concern for Septic Joint: Patient refuses to elaborate on R shoulder pain, except to say that it hurts. She is lying on her right shoulder at present but refused any exam. Patient refused MRI today. - Hopeful for MRI this weekend - Pain control as above   Nausea/Vomiting: Improved with scopolamine patch. - Continue with scopolamine and PRN Zofran - Prefer scopolamine alone if sufficient given borderline prolonged QTc    MDD: Patient is on Lexapro at home. 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   - Encouraged nursing staff to provide morphine as needed for breakthrough pain for now  - Will discuss transitioning to suboxone or Methadone once testing has been completed   Prior to Admission Living Arrangement: Home  Anticipated Discharge Location: Home Barriers to Discharge: IV ABX / endocarditis   Code Status: Full Code Diet: Heart Healthy Diet IVF: None DVT PPx: Lovenox   LOS: 3 days   Suzanne [name], Medical Student 10/22/2019, 11:00 AM

## 2019-10-22 NOTE — Anesthesia Preprocedure Evaluation (Signed)
Anesthesia Evaluation  Patient identified by MRN, date of birth, ID band Patient awake    Reviewed: Allergy & Precautions, NPO status , Patient's Chart, lab work & pertinent test results  History of Anesthesia Complications Negative for: history of anesthetic complications  Airway Mallampati: I  TM Distance: >3 FB Neck ROM: Full    Dental  (+) Dental Advisory Given, Teeth Intact   Pulmonary Current Smoker and Patient abstained from smoking.,    breath sounds clear to auscultation       Cardiovascular  Rhythm:Regular  1. Left ventricular ejection fraction, by estimation, is 60 to 65%. The  left ventricle has normal function. The left ventricle has no regional  wall motion abnormalities. Left ventricular diastolic parameters were  normal.  2. Right ventricular systolic function is normal. The right ventricular  size is normal. Tricuspid regurgitation signal is inadequate for assessing  PA pressure.  3. The mitral valve is normal in structure. Trivial mitral valve  regurgitation. No evidence of mitral stenosis.  4. The aortic valve is tricuspid. Aortic valve regurgitation is not  visualized. No aortic stenosis is present.  5. The inferior vena cava is normal in size with greater than 50%  respiratory variability, suggesting right atrial pressure of 3 mmHg.   S/p ablation for WPW  Rule out endocarditis   Neuro/Psych PSYCHIATRIC DISORDERS Depression negative neurological ROS     GI/Hepatic negative GI ROS, (+)     substance abuse  methamphetamine use and IV drug use,   Endo/Other  negative endocrine ROS  Renal/GU negative Renal ROS     Musculoskeletal negative musculoskeletal ROS (+) narcotic dependent  Abdominal   Peds  Hematology  (+) Blood dyscrasia, anemia , Lab Results      Component                Value               Date                      WBC                      25.1 (H)            10/21/2019                 HGB                      9.2 (L)             10/21/2019                HCT                      27.6 (L)            10/21/2019                MCV                      84.9                10/21/2019                PLT                      244  10/21/2019              Anesthesia Other Findings   Reproductive/Obstetrics Lab Results      Component                Value               Date                      PREGTESTUR               Negative            12/31/2017                PREGSERUM                NEGATIVE            08/07/2015                HCG                      <5.0                10/18/2019                                        Anesthesia Physical Anesthesia Plan  ASA: III  Anesthesia Plan: MAC   Post-op Pain Management:    Induction: Intravenous  PONV Risk Score and Plan: 1 and Treatment may vary due to age or medical condition and Propofol infusion  Airway Management Planned: Nasal Cannula  Additional Equipment: None  Intra-op Plan:   Post-operative Plan:   Informed Consent: I have reviewed the patients History and Physical, chart, labs and discussed the procedure including the risks, benefits and alternatives for the proposed anesthesia with the patient or authorized representative who has indicated his/her understanding and acceptance.     Dental advisory given  Plan Discussed with: CRNA  Anesthesia Plan Comments:         Anesthesia Quick Evaluation

## 2019-10-22 NOTE — Consult Note (Addendum)
Referring Provider: Dr. Heber Shelly Primary Care Physician:  Mancel Bale, PA-C Primary Gastroenterologist:  Althia Forts  Reason for Consultation:  GI bleed  HPI: Suzanne [name] is a 29 y.o. 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  Apparently last night she started having diarrhea, several times throughout the night and today as well.  This apparently has been very dark in color and was concerned that it contained blood.  The patient is a poor historian, giving little history.  She has also been very difficult, refusing blood draws, etc.  She tells me that she has seen some bright red blood on the toilet paper on occasion at home in the past, but per the nursing staff her stools have been very dark/black in color.  Her husband reports that she was taking several high-dose ibuprofen daily for several days prior to hospital admission.  She denies abdominal pain.  She admits to nausea, but says that the antiemetics helped and she has not had any vomiting in a couple of days.  Hgb 11.1 grams 4 days ago and 9.2 grams yesterday.  She refuse repeat CBC earlier but just had it drawn now.    Past Medical History:  Diagnosis Date  . 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 Depression   . Lipoma of neck 04/2017   posterior  . Scoliosis    idiopathic   . Sepsis ([place])   . WPW (Wolff-Parkinson-White syndrome)     Past Surgical History:  Procedure Laterality Date  . COLPOSCOPY  01/2010 & 02/2011    both were negative   . ELECTROPHYSIOLOGIC STUDY N/A 05/17/2015   SVT ablation by Dr Curt Bears  . LIPOMA EXCISION N/A 05/07/2017   Procedure: EXCISION LIPOMA POSTERIOR NECK ERAS PATHWAY;  Surgeon: Erroll Luna, MD;  Location: [facility];  Service: General;  Laterality: N/A;  . WISDOM TOOTH EXTRACTION      Prior to Admission medications   Medication Sig Start Date End Date Taking? Authorizing  Provider  dextroamphetamine (DEXTROSTAT) 10 MG tablet 3 po qam, 2 po qafternoon Patient taking differently: Take 20-30 mg by mouth 2 (two) times daily as needed (ADHD *no more than 50mg /max per day*).  12/31/17  Yes Rutherford Guys, MD  escitalopram (LEXAPRO) 20 MG tablet Take 1.5 tablets (30 mg total) by mouth daily. 12/31/17  Yes Rutherford Guys, MD  gabapentin (NEURONTIN) 400 MG capsule Take 1 capsule (400 mg total) by mouth at bedtime. 12/31/17  Yes Rutherford Guys, MD  dextroamphetamine (DEXTROSTAT) 10 MG tablet Take 3 tablets (30 mg total) by mouth daily before breakfast AND 2 tablets (20 mg total) daily after lunch. Patient not taking: Reported on 10/19/2019 01/30/18 03/01/18  Rutherford Guys, MD  dextroamphetamine (DEXTROSTAT) 10 MG tablet Take 3 tablets (30 mg total) by mouth daily before breakfast AND 2 tablets (20 mg total) daily after lunch. Patient not taking: Reported on 10/19/2019 03/02/18 04/01/18  Rutherford Guys, MD  medroxyPROGESTERone Acetate 150 MG/ML SUSY Inject 1 mL (150 mg total) into the muscle every 3 (three) months. Patient not taking: Reported on 05/28/2018 03/25/18   Rutherford Guys, MD    Current Facility-Administered Medications  Medication Dose Route Frequency Provider Last Rate Last Admin  . acetaminophen (TYLENOL) tablet 650 mg  650 mg Oral Q6H PRN Jerline Pain, MD   650 mg at 10/19/19 1732   Or  . acetaminophen (TYLENOL)  suppository 650 mg  650 mg Rectal Q6H PRN Jerline Pain, MD      . ceFAZolin (ANCEF) IVPB 2g/100 mL premix  2 g Intravenous Q8H Candee Furbish C, MD 200 mL/hr at 10/22/19 1345 2 g at 10/22/19 1345  . dextroamphetamine (DEXTROSTAT) tablet 20 mg  20 mg Oral BID PRN Jerline Pain, MD      . diclofenac Sodium (VOLTAREN) 1 % topical gel 4 g  4 g Topical QID Jerline Pain, MD   4 g at 10/22/19 1400  . escitalopram (LEXAPRO) tablet 30 mg  30 mg Oral Daily Jerline Pain, MD   30 mg at 10/22/19 1129  . gabapentin (NEURONTIN) capsule 400 mg  400 mg  Oral QHS Jerline Pain, MD   400 mg at 10/21/19 2137  . morphine 4 MG/ML injection 4-6 mg  4-6 mg Intravenous Q3H PRN Jerline Pain, MD   4 mg at 10/22/19 1346  . ondansetron (ZOFRAN) injection 4 mg  4 mg Intravenous Q6H PRN Jerline Pain, MD   4 mg at 10/21/19 2009  . oxyCODONE (Oxy IR/ROXICODONE) immediate release tablet 10 mg  10 mg Oral QID Jerline Pain, MD   10 mg at 10/22/19 1340  . pantoprazole (PROTONIX) injection 40 mg  40 mg Intravenous Q12H Nitish Roes D, PA-C      . scopolamine (TRANSDERM-SCOP) 1 MG/3DAYS 1.5 mg  1 patch Transdermal Q72H Jerline Pain, MD   1.5 mg at 10/21/19 1722    Allergies as of 10/18/2019  . (No Known Allergies)    Family History  Problem Relation Age of Onset  . Other Brother        substance abuse  . Hyperlipidemia Maternal Grandfather   . Anxiety disorder Maternal Grandfather   . Alcoholism Maternal Grandfather   . Anxiety disorder Mother   . ADD / ADHD Father   . Drug abuse Father   . ADD / ADHD Brother   . Other Brother        bone marrow transplant  . Breast cancer Paternal Grandmother   . Aplastic anemia Brother   . Hypertension Maternal Grandmother   . Alcoholism Maternal Grandmother   . Diabetes Paternal Grandfather   . Cancer Paternal Grandfather   . Arrhythmia Brother   . Alcoholism Other   . Alcoholism Maternal Uncle     Social History   Socioeconomic History  . Marital status: Single    Spouse name: Not on file  . Number of children: Not on file  . Years of education: Not on file  . Highest education level: Not on file  Occupational History  . Not on file  Tobacco Use  . Smoking status: Current Every Day Smoker    Packs/day: 0.00    Years: 7.00    Pack years: 0.00    Types: Cigarettes  . Smokeless tobacco: Never Used  . Tobacco comment: 1 cig./day  Vaping Use  . Vaping Use: Every day  Substance and Sexual Activity  . Alcohol use: Yes    Alcohol/week: 1.0 standard drink    Types: 1 Glasses of wine per  week    Comment: 1 glass wine/day  . Drug use: Yes    Types: Marijuana, IV, Heroin    Comment: last used 04/13/2017  . Sexual activity: Yes    Partners: Male    Birth control/protection: None  Other Topics Concern  . 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 Difficulty of Paying Living Expenses: Not on file  Food Insecurity:   . Worried About Charity fundraiser in the Last Year: Not on file  . Ran Out of Food in the Last Year: Not on file  Transportation Needs:   . Lack of Transportation (Medical): Not on file  . Lack of Transportation (Non-Medical): Not on file  Physical Activity:   . Days of Exercise per Week: Not on file  . Minutes of Exercise per Session: Not on file  Stress:   . Feeling of Stress : Not on file  Social Connections:   . Frequency of Communication with Friends and Family: Not on file  . Frequency of Social Gatherings with Friends and Family: Not on file  . Attends Religious Services: Not on file  . Active Member of Clubs or Organizations: Not on file  . Attends Archivist Meetings: Not on file  . Marital Status: Not on file  Intimate Partner Violence:   . Fear of Current or Ex-Partner: Not on file  . Emotionally Abused: Not on file  . Physically Abused: Not on file  . Sexually Abused: Not on file   Review of Systems: ROS is O/W negative except as mentioned in HPI.  Physical Exam: Vital signs in last 24 hours: Temp:  [97.5 F (36.4 C)-98.4 F (36.9 C)] 97.8 F (36.6 C) (09/10 1517) Pulse Rate:  [43-143] 124 (09/10 1517) Resp:  [14-30] 22 (09/10 1517) BP: (92-139)/(47-92) 92/54 (09/10 1517) SpO2:  [99 %-100 %] 100 % (09/10 1517) Last BM Date: 10/21/19 General:  Alert, Well-developed, well-nourished, pleasant and cooperative in NAD; appears pale. Head:  Normocephalic and atraumatic. Eyes:  Sclera  clear, no icterus.  Conjunctiva pink. Ears:  Normal auditory acuity. Mouth:  No deformity or lesions.   Lungs:  Clear throughout to auscultation.  No wheezes, crackles, or rhonchi.  Heart:  Tachy; no murmurs, clicks, rubs, or gallops. Abdomen:  Soft, non-distended.  BS present and hyperactive.  Non-tender.  Rectal:  Was performed by the resident.  Msk:  Symmetrical without gross deformities. Pulses:  Normal pulses noted. Extremities:  Without clubbing or edema. Neurologic:  Alert and oriented x 4;  grossly normal neurologically. Skin:  Intact without significant lesions or rashes. Psych:  Alert and cooperative. Normal mood and affect.  Intake/Output from previous day: 09/09 0701 - 09/10 0700 In: 717.9 [P.O.:240; I.V.:89.2; IV Piggyback:388.8] Out: 0  Intake/Output this shift: Total I/O In: 400 [I.V.:400] Out: -   Lab Results: Recent Labs    10/20/19 0705 10/21/19 1219  WBC 24.9* 25.1*  HGB 9.6* 9.2*  HCT 28.4* 27.6*  PLT 212 244   BMET Recent Labs    10/20/19 0705 10/21/19 1219 10/22/19 1219  NA 135 136 137  K 2.9* 3.6 3.5  CL 103 103 106  CO2 19* 19* 21*  GLUCOSE 114* 117* 110*  BUN 19 12 13   CREATININE 0.81 0.63 0.48  CALCIUM 8.9 8.8* 8.1*   LFT Recent Labs    10/22/19 1219  PROT 5.3*  ALBUMIN 1.9*  AST 22  ALT 15  ALKPHOS 104  BILITOT 0.3   Hepatitis Panel Recent Labs    10/22/19 1219  HEPBSAG NON REACTIVE      Studies/Results: ECHOCARDIOGRAM COMPLETE  Result Date: 10/21/2019    ECHOCARDIOGRAM REPORT   Patient Name:  Suzanne [name] Date of Exam: 10/21/2019 Medical Rec #:  [id number]  Height:       66.0 in Accession #:    [id number] Weight:       126.8 lb Date of Birth:  April 08, 1990  BSA:          1.647 m Patient Age:    29 years   BP:           133/98 mmHg Patient Gender: F          HR:           80 bpm. Exam Location:  Inpatient Procedure: 2D Echo, Cardiac Doppler and Color Doppler Indications:    Fever 780.6/R50.9; Dyspnea 786.09/R06.00; R07.9*  Chest pain,                 unspecified  History:        Patient has prior history of Echocardiogram examinations, most                 recent 08/08/2015. Wolf-Parkinson-White Syndrome. Sepsis.  Sonographer:    Jonelle Sidle Dance Referring Phys: [id number] [place]  1. Left ventricular ejection fraction, by estimation, is 60 to 65%. The left ventricle has normal function. The left ventricle has no regional wall motion abnormalities. Left ventricular diastolic parameters were normal.  2. Right ventricular systolic function is normal. The right ventricular size is normal. Tricuspid regurgitation signal is inadequate for assessing PA pressure.  3. The mitral valve is normal in structure. Trivial mitral valve regurgitation. No evidence of mitral stenosis.  4. The aortic valve is tricuspid. Aortic valve regurgitation is not visualized. No aortic stenosis is present.  5. The inferior vena cava is normal in size with greater than 50% respiratory variability, suggesting right atrial pressure of 3 mmHg. Comparison(s): No significant change from prior study. Conclusion(s)/Recommendation(s): Normal biventricular function without evidence of hemodynamically significant valvular heart disease. No evidence of valvular vegetations on this transthoracic echocardiogram. Would recommend a transesophageal echocardiogram to exclude infective endocarditis if clinically indicated. FINDINGS  Left Ventricle: 3D EF not used as it did not track endocardial borders. Left ventricular ejection fraction, by estimation, is 60 to 65%. The left ventricle has normal function. The left ventricle has no regional wall motion abnormalities. The left ventricular internal cavity size was normal in size. There is no left ventricular hypertrophy. Left ventricular diastolic parameters were normal. Right Ventricle: The right ventricular size is normal. No increase in right ventricular wall thickness. Right ventricular systolic function is normal.  Tricuspid regurgitation signal is inadequate for assessing PA pressure. Left Atrium: Left atrial size was normal in size. Right Atrium: Right atrial size was normal in size. Pericardium: There is no evidence of pericardial effusion. Mitral Valve: The mitral valve is normal in structure. Trivial mitral valve regurgitation. No evidence of mitral valve stenosis. Tricuspid Valve: The tricuspid valve is normal in structure. Tricuspid valve regurgitation is trivial. No evidence of tricuspid stenosis. Aortic Valve: The aortic valve is tricuspid. Aortic valve regurgitation is not visualized. No aortic stenosis is present. Pulmonic Valve: The pulmonic valve was grossly normal. Pulmonic valve regurgitation is mild. No evidence of pulmonic stenosis. Aorta: The aortic root, ascending aorta and aortic arch are all structurally normal, with no evidence of dilitation or obstruction. Venous: The inferior vena cava is normal in size with greater than 50% respiratory variability, suggesting right atrial pressure of 3 mmHg. IAS/Shunts: No atrial level shunt detected by color flow Doppler.  LEFT VENTRICLE PLAX 2D LVIDd:  4.30 cm  Diastology LVIDs:         3.00 cm  LV e' medial:    9.36 cm/s LV PW:         0.90 cm  LV E/e' medial:  10.3 LV IVS:        0.70 cm  LV e' lateral:   15.40 cm/s LVOT diam:     2.00 cm  LV E/e' lateral: 6.2 LV SV:         62 LV SV Index:   37 LVOT Area:     3.14 cm  RIGHT VENTRICLE RV Basal diam:  2.50 cm RV S prime:     10.80 cm/s TAPSE (M-mode): 2.4 cm LEFT ATRIUM             Index       RIGHT ATRIUM          Index LA diam:        3.00 cm 1.82 cm/m  RA Area:     9.65 cm LA Vol (A2C):   36.7 ml 22.28 ml/m RA Volume:   18.40 ml 11.17 ml/m LA Vol (A4C):   25.6 ml 15.54 ml/m LA Biplane Vol: 33.2 ml 20.15 ml/m  AORTIC VALVE LVOT Vmax:   104.00 cm/s LVOT Vmean:  66.500 cm/s LVOT VTI:    0.196 m  AORTA Ao Root diam: 2.70 cm Ao Asc diam:  2.60 cm MITRAL VALVE MV Area (PHT): 3.97 cm    SHUNTS MV Decel  Time: 191 msec    Systemic VTI:  0.20 m MV E velocity: 96.10 cm/s  Systemic Diam: 2.00 cm MV A velocity: 44.10 cm/s MV E/A ratio:  2.18 Buford Dresser MD Electronically signed by Buford Dresser MD Signature Date/Time: 10/21/2019/11:53:41 AM    Final    ECHO TEE  Result Date: 10/22/2019    TRANSESOPHOGEAL ECHO REPORT   Patient Name:   Suzanne [name] Date of Exam: 10/22/2019 Medical Rec #:  [id number]  Height:       66.0 in Accession #:    [id number] Weight:       126.8 lb Date of Birth:  1990/07/01  BSA:          1.647 m Patient Age:    29 years   BP:           124/92 mmHg Patient Gender: F          HR:           73 bpm. Exam Location:  Inpatient Procedure: 3D Echo, Transesophageal Echo, Color Doppler and Cardiac Doppler Indications:     bactermia  History:         Patient has prior history of Echocardiogram examinations, most                  recent 10/21/2019. Sepsis.  Sonographer:     Johny Chess Referring Phys:  [id number] [place] Diagnosing Phys: Candee Furbish MD PROCEDURE: After discussion of the risks and benefits of a TEE, an informed consent was obtained from the patient. The transesophogeal probe was passed without difficulty through the esophogus of the patient. Sedation performed by different physician. The patient was monitored while under deep sedation. Anesthestetic sedation was provided intravenously by Anesthesiology: 418mg  of Propofol. The patient developed no complications during the procedure. IMPRESSIONS  1. Left ventricular ejection fraction, by estimation, is 60 to 65%. The left ventricle has normal function.  2. Right ventricular systolic function is normal. The right ventricular size is  normal.  3. No left atrial/left atrial appendage thrombus was detected.  4. The mitral valve is normal in structure. Trivial mitral valve regurgitation.  5. There is a large 2 x 1.5 cm spherical vegetation attached to the lateral chordal valvular support structure within the right ventricle.  There is also an elongated 2cm x 0.5cm mobile vegetation attached to above vegetation. The tricuspid valve is abnormal.  6. The aortic valve is normal in structure. Aortic valve regurgitation is not visualized. No aortic stenosis is present. Conclusion(s)/Recommendation(s): Findings concerning for tricuspid valve vegetation. FINDINGS  Left Ventricle: Left ventricular ejection fraction, by estimation, is 60 to 65%. The left ventricle has normal function. The left ventricular internal cavity size was normal in size. Right Ventricle: The right ventricular size is normal. No increase in right ventricular wall thickness. Right ventricular systolic function is normal. Left Atrium: Left atrial size was normal in size. No left atrial/left atrial appendage thrombus was detected. Right Atrium: Right atrial size was normal in size. Pericardium: There is no evidence of pericardial effusion. Mitral Valve: The mitral valve is normal in structure. Trivial mitral valve regurgitation. Tricuspid Valve: There is a large 2 x 1.5 cm spherical vegetation attached to the lateral chordal valvular support structure within the right ventricle. There is also an elongated 2cm x 0.5cm mobile vegetation attached to above vegetation. The tricuspid valve is abnormal. Tricuspid valve regurgitation is mild. Aortic Valve: The aortic valve is normal in structure. Aortic valve regurgitation is not visualized. No aortic stenosis is present. Pulmonic Valve: The pulmonic valve was normal in structure. Pulmonic valve regurgitation is not visualized. Aorta: The aortic root is normal in size and structure. IAS/Shunts: No atrial level shunt detected by color flow Doppler. Candee Furbish MD Electronically signed by Candee Furbish MD Signature Date/Time: 10/22/2019/2:13:01 PM    Final    IMPRESSION:  *GI bleed:  Described as melena/very dark stool by the nurse.  Patient is a poor historian.  He husband reports that she was taking several high dose Ibuprofen daily  for several days prior to admission.   *Diarrhea:  Since last night.  ? Side effects of antibiotics vs Cdiff or other etiology. *Acute blood loss anemia:  Hgb 11.1 grams 4 days ago and 9.2 grams yesterday (today is pending because she refused it earlier). 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  Per ID needs eval by CT surgery.  On IV abx. *IVDA  PLAN: -Check for Cdiff. -Will start pantoprazole 40 mg IV BID. -Monitor CBC and transfuse if needed. -Patient declining EGD or any procedure for tomorrow.  **Addendum:  Patient's Hgb came back at 4.3 grams this afternoon.  She is going to receive 3 units of PRBCs.  She is tentatively agreeable for EGD tomorrow AM, rule out ulcer, ? Esophageal tear from TEE earlier today, etc.  If she becomes unstable then she needs to be transferred to the ICU and EGD would be done more emergently overnight.  Laban Emperor. Buford Gayler  10/22/2019, 4:01 PM

## 2019-10-22 NOTE — Progress Notes (Addendum)
Patient evaluated at bedside by night team due to concerns of high likelihood of decompensation. She has been transferred to a progressive unit. She is lying comfortably in bed and does not appear to be in acute distress. She is currently receiving her second unit of blood. Vital signs: BP 102/67, HR 105-110s, SpO2 98% on room air, RR 16-18.  No further bowel movements. Patient is refusing second IV access. Discussed with patient need for further access given suspicion for GI bleed and risk for decompensation. I expressed my concern for rapid fluid resuscitation given ongoing GI bleed and high risk of decompensation with possible transfer to ICU if unable to replace blood and give further fluids. She expresses understanding but continues to review second IV access. RN at bedside. Will continue to monitor.

## 2019-10-22 NOTE — Progress Notes (Signed)
(  Oncall) day team progress note:  We saw pt this afternoon when noticed vital 92/54, HR 124. She just had large amount of dark red/brown BM and + FOBT. She is ill appearing but no abdominal pain, chest pain. Abdominal exam is benign.  Serial vital signs and stat CBC ordered, consulted GI and notified blood bank for anticipated transfusion need. >>Hb came back 4.3. Ordered emergent release of 3 units p RBC. GI is aware. We saw pt again, she looks more pale. GI PA is at bedside as well. We discussed significant sudden drop in Hb today and large amount of bloody stool, and concern for decompensation today. No emergent EGD now per GI. (Patient had previously declined EGD or other procedure from GI. But now tentatively agrees with plan for EGD tomorrow morning, but states that if she feels better with blood products she may decline EGD. Explain her situation may be critical w/o Tx). GI also ordered PPI drip and bedside US to evaluate for cirrhosis. Our attending, Dr. Heber Spur is aware. We will continue bolus fluid, PT, PTT, INR. Called mother and updated her on patient's status. She is appropriately concerned. Answered all questions.  Abbreviated Assessment and Plan  1. Active Gastrointestinal Bleeding  Patient developed diarrhea this morning and now has FOBT positive maroonish-brown stools in commode and on digital rectal exam. No abdominal TTP or continued vomiting. No history of GIB despite slight drop in hemoglobin from 11.1 to 9.2 yesterday. Further drop to 4.3 this afternoon. - GI is on board appreciate follow up and recs. - start 3 units pRBCs (Antibody screen had not yet resulted so emergency release given for 1 unit of most likely compatible blood available, will order other 2 units with screen results.)  - NPO - IV team for 18 gage IV line Per PCCM rec. - 1L LR bolus. Will keep administrating bolus LR her until tachycardia resolves. - Started Protonix drip - f/u PT, PTT, INR - RUQ bedside  ultrasound to r/o cirrhosis, start octreotide if cirrhotic (called [place] department and they will come now) - tentative plan for endoscopy tomorrow morning. If worsening status before then, GI on call and PCCM -Pt at high risk for decompensation. Transfer to progressive unit. Rapid response is aware of pt.   2. 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  - Continue plan as per previous note -talked to Dr. Johnnye Sima and Dr. Heber [place]. Both agrees that we can place central line if needed  - Zofran PRN for nausea with scopolamine patch, monitor for QTc prolongation   [facility] resident team

## 2019-10-22 NOTE — Progress Notes (Signed)
Patient moved her bowels in the bed. Refused to get cleaned up at first but she requested to get washed after she moved her bowels for the second time.Washed patient up and a  complete bed change made. Continue to have loose stools. BSC made available to patient.

## 2019-10-22 NOTE — Progress Notes (Signed)
[facility] for Infectious Disease   Reason for visit: Follow up on bacteremia  Interval History: TEE c/w large TV vegetation. Complaint of pain.  Feels poorly.  Some bloody stool this am.  Remains afebrile.  WBC 25.1.     Physical Exam: Constitutional:  Vitals:   10/22/19 0940 10/22/19 0950  BP: 117/65 (!) 124/92  Pulse: 72 (!) 43  Resp: (!) 30 (!) 24  Temp:    SpO2: 100% 100%   patient appears in NAD Respiratory: Normal respiratory effort; CTA B Cardiovascular: RRR GI: soft, nt, nd  Review of Systems: Constitutional: negative for chills Integument/breast: negative for rash  Lab Results  Component Value Date   WBC 25.1 (H) 10/21/2019   HGB 9.2 (L) 10/21/2019   HCT 27.6 (L) 10/21/2019   MCV 84.9 10/21/2019   PLT 244 10/21/2019    Lab Results  Component Value Date   CREATININE 0.63 10/21/2019   BUN 12 10/21/2019   NA 136 10/21/2019   K 3.6 10/21/2019   CL 103 10/21/2019   CO2 19 (L) 10/21/2019    Lab Results  Component Value Date   ALT 12 10/21/2019   AST 21 10/21/2019   ALKPHOS 156 (H) 10/21/2019     Microbiology: Recent Results (from the past 240 hour(s))  SARS Coronavirus 2 by RT PCR (hospital order, performed in [facility] hospital lab) Nasopharyngeal Nasopharyngeal Swab     Status: None   Collection Time: 10/19/19  7:02 AM   Specimen: Nasopharyngeal Swab  Result Value Ref Range Status   SARS Coronavirus 2 NEGATIVE NEGATIVE Final    Comment: (NOTE) SARS-CoV-2 target nucleic acids are NOT DETECTED.  The SARS-CoV-2 RNA is generally detectable in upper and lower respiratory specimens during the acute phase of infection. The lowest concentration of SARS-CoV-2 viral copies this assay can detect is 250 copies / mL. A negative result does not preclude SARS-CoV-2 infection and should not be used as the sole basis for treatment or other patient management decisions.  A negative result may occur with improper specimen collection / handling,  submission of specimen other than nasopharyngeal swab, presence of viral mutation(s) within the areas targeted by this assay, and inadequate number of viral copies (<250 copies / mL). A negative result must be combined with clinical observations, patient history, and epidemiological information.  Fact Sheet for Patients:   StrictlyIdeas.no  Fact Sheet for Healthcare Providers: BankingDealers.co.za  This test is not yet approved or  cleared by the [place] FDA and has been authorized for detection and/or diagnosis of SARS-CoV-2 by FDA under an Emergency Use Authorization (EUA).  This EUA will remain in effect (meaning this test can be used) for the duration of the COVID-19 declaration under Section 564(b)(1) of the Act, 21 U.S.C. section 360bbb-3(b)(1), unless the authorization is terminated or revoked sooner.  Performed at [facility], [place] [address], [place], [place] [address]   Blood culture (routine x 2)     Status: Abnormal   Collection Time: 10/19/19  9:57 AM   Specimen: BLOOD RIGHT HAND  Result Value Ref Range Status   Specimen Description BLOOD RIGHT HAND  Final   Special Requests   Final    BOTTLES DRAWN AEROBIC ONLY Blood Culture results may not be optimal due to an inadequate volume of blood received in culture bottles   Culture  Setup Time   Final    GRAM POSITIVE COCCI IN CLUSTERS AEROBIC BOTTLE ONLY CRITICAL RESULT CALLED TO, READ BACK  BY AND VERIFIED WITH: PHARMD C AMEND 10/19/19 AT 2328 SK Performed at [facility], [place] [address] [place], [place] [address]    Culture STAPHYLOCOCCUS AUREUS (A)  Final   Report Status 10/21/2019 FINAL  Final   Organism ID, Bacteria STAPHYLOCOCCUS AUREUS  Final      Susceptibility   Staphylococcus aureus - MIC*    CIPROFLOXACIN <=0.5 SENSITIVE Sensitive     ERYTHROMYCIN <=0.25 SENSITIVE Sensitive     GENTAMICIN <=0.5 SENSITIVE Sensitive     OXACILLIN <=0.25  SENSITIVE Sensitive     TETRACYCLINE <=1 SENSITIVE Sensitive     VANCOMYCIN 1 SENSITIVE Sensitive     TRIMETH/SULFA <=10 SENSITIVE Sensitive     CLINDAMYCIN <=0.25 SENSITIVE Sensitive     RIFAMPIN <=0.5 SENSITIVE Sensitive     Inducible Clindamycin NEGATIVE Sensitive     * STAPHYLOCOCCUS AUREUS  Blood Culture ID Panel (Reflexed)     Status: Abnormal   Collection Time: 10/19/19  9:57 AM  Result Value Ref Range Status   Enterococcus faecalis NOT DETECTED NOT DETECTED Final   Enterococcus Faecium NOT DETECTED NOT DETECTED Final   Listeria monocytogenes NOT DETECTED NOT DETECTED Final   Staphylococcus species DETECTED (A) NOT DETECTED Final    Comment: CRITICAL RESULT CALLED TO, READ BACK BY AND VERIFIED WITH: PHARMD C AMEND 10/19/19 AT 2328 SK    Staphylococcus aureus (BCID) DETECTED (A) NOT DETECTED Final    Comment: CRITICAL RESULT CALLED TO, READ BACK BY AND VERIFIED WITH: PHARMD C AMEND 10/19/19 AT 2328 SK    Staphylococcus epidermidis NOT DETECTED NOT DETECTED Final   Staphylococcus lugdunensis NOT DETECTED NOT DETECTED Final   Streptococcus species NOT DETECTED NOT DETECTED Final   Streptococcus agalactiae NOT DETECTED NOT DETECTED Final   Streptococcus pneumoniae NOT DETECTED NOT DETECTED Final   Streptococcus pyogenes NOT DETECTED NOT DETECTED Final   A.calcoaceticus-baumannii NOT DETECTED NOT DETECTED Final   Bacteroides fragilis NOT DETECTED NOT DETECTED Final   Enterobacterales NOT DETECTED NOT DETECTED Final   Enterobacter cloacae complex NOT DETECTED NOT DETECTED Final   Escherichia coli NOT DETECTED NOT DETECTED Final   Klebsiella aerogenes NOT DETECTED NOT DETECTED Final   Klebsiella oxytoca NOT DETECTED NOT DETECTED Final   Klebsiella pneumoniae NOT DETECTED NOT DETECTED Final   Proteus species NOT DETECTED NOT DETECTED Final   Salmonella species NOT DETECTED NOT DETECTED Final   Serratia marcescens NOT DETECTED NOT DETECTED Final   Haemophilus influenzae NOT  DETECTED NOT DETECTED Final   Neisseria meningitidis NOT DETECTED NOT DETECTED Final   Pseudomonas aeruginosa NOT DETECTED NOT DETECTED Final   Stenotrophomonas maltophilia NOT DETECTED NOT DETECTED Final   Candida albicans NOT DETECTED NOT DETECTED Final   Candida auris NOT DETECTED NOT DETECTED Final   Candida glabrata NOT DETECTED NOT DETECTED Final   Candida krusei NOT DETECTED NOT DETECTED Final   Candida parapsilosis NOT DETECTED NOT DETECTED Final   Candida tropicalis NOT DETECTED NOT DETECTED Final   Cryptococcus neoformans/gattii NOT DETECTED NOT DETECTED Final   Meth resistant mecA/C and MREJ NOT DETECTED NOT DETECTED Final    Comment: Performed at [facility], [address] [place],  [address]  Blood culture (routine x 2)     Status: Abnormal   Collection Time: 10/19/19 10:07 AM   Specimen: BLOOD RIGHT ARM  Result Value Ref Range Status   Specimen Description BLOOD RIGHT ARM  Final   Special Requests   Final    BOTTLES DRAWN AEROBIC AND  ANAEROBIC Blood Culture adequate volume   Culture  Setup Time   Final    GRAM POSITIVE COCCI IN CLUSTERS IN BOTH AEROBIC AND ANAEROBIC BOTTLES CRITICAL VALUE NOTED.  VALUE IS CONSISTENT WITH PREVIOUSLY REPORTED AND CALLED VALUE.    Culture (A)  Final    STAPHYLOCOCCUS AUREUS SUSCEPTIBILITIES PERFORMED ON PREVIOUS CULTURE WITHIN THE LAST 5 DAYS. Performed at [facility], [place] [address], [place], [place] [address]    Report Status 10/21/2019 FINAL  Final    Impression/Plan:  1. TV endocarditis - large size.  MSSA on blood cultures.  She will need an evaluation by TCTS, ? Angiovac.  Will also need to continue IV antibiotics in a supervised setting.  Discussed with primary team  2.  Screening - HIV negative. Hepatitis C pending.   Dr. Johnnye Sima available over the weekend.  Dr. Megan Salon will follow up on Monday.

## 2019-10-22 NOTE — Interval H&P Note (Signed)
History and Physical Interval Note:  10/22/2019 8:30 AM  Suzanne [name]  has presented today for surgery, with the diagnosis of BACTERIMIA IV DRUG USE.  The various methods of treatment have been discussed with the patient and family. After consideration of risks, benefits and other options for treatment, the patient has consented to  Procedure(s): TRANSESOPHAGEAL ECHOCARDIOGRAM (TEE) (N/A) as a surgical intervention.  The patient's history has been reviewed, patient examined, no change in status, stable for surgery.  I have reviewed the patient's chart and labs.  Questions were answered to the patient's satisfaction.     [employer]

## 2019-10-22 NOTE — Consult Note (Addendum)
[address]       [place] [address]             [phone]        Marylou Muench Hendricks Medical Record #[id number] Date of Birth: 1991-01-09  Referring: No ref. provider found Primary Care: Gale Journey Micki Riley Primary Cardiologist:No primary care provider on file.  Chief Complaint:    Chief Complaint  Patient presents with  . Chest Pain    History of Present Illness:   Ms. Mandt is a 29 year old female with past medical history significant for IV heroin use, Wolff-Parkinson-White syndrome, SVT, attention deficit disorder, tobacco abuse, and depression.  She presented to the emergency room on 10/18/2019 with a primary complaint of chest pain of 5 days duration.  On the day prior to presentation she also developed right shoulder pain and felt that she had a fever although this was not quantitated.  She reported that her last IV heroin abuse was about 1 week prior to this admission.  She also reported that her boyfriend, who is also an IV drug user, had an episode of "bloodstream infection" earlier this year.  Work-up in the emergency room included white blood count of 25,000, hematocrit 33%, creatinine 1.3 and no significant electrolyte abnormalities.  Chest x-ray shows no acute abnormality.  EKG showed sinus tachycardia with a rate in the mid 130s.  There were nonspecific ST-T wave changes present.  BNP was elevated at 397 and serial high-sensitivity troponin was 33 and 31.  Serum pregnancy test was negative.  CT angiography of the chest showed no evidence of pulmonary embolus but there were multifocal nodular consolidations, some of which had early cavitation.  She was admitted to the hospital by the maternal medicine service and started on IV antibiotics under suspicion of bacterial endocarditis.  Blood cultures eventually proved positive with methicillin sensitive Staph aureus.  Both infectious disease and cardiology services were consulted.  Transesophageal echo was  recommended and carried out earlier today.  This demonstrated a 1.5 x 2 cm mobile lesion on the tricuspid valve with a secondary smaller lesion adjacent to the.  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  She has had several loose, maroon stools over the past 24 hours and concurrent decline in the Hgb from 11 gm on admission to 9 gm today. The primary team has consulted the GI service for w/u of GIB.    Current Activity/ Functional Status:    Zubrod Score: At the time of surgery this patient's most appropriate activity status/level should be described as: []     0    Normal activity, no symptoms [x]     1    Restricted in physical strenuous activity but ambulatory, able to do out light work []     2    Ambulatory and capable of self care, unable to do work activities, up and about                 more than 50%  Of the time                            []     3    Only limited self care, in bed greater than 50% of waking hours []     4    Completely disabled, no self care, confined to bed  or chair []     5    Moribund  Past Medical History:  Diagnosis Date  . 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 Depression   . Lipoma of neck 04/2017   posterior  . Scoliosis    idiopathic   . Sepsis ([place])   . WPW (Wolff-Parkinson-White syndrome)     Past Surgical History:  Procedure Laterality Date  . COLPOSCOPY  01/2010 & 02/2011    both were negative   . ELECTROPHYSIOLOGIC STUDY N/A 05/17/2015   SVT ablation by Dr Curt Bears  . LIPOMA EXCISION N/A 05/07/2017   Procedure: EXCISION LIPOMA POSTERIOR NECK ERAS PATHWAY;  Surgeon: Erroll Luna, MD;  Location: [facility];  Service: General;  Laterality: N/A;  . WISDOM TOOTH EXTRACTION      Social History   Tobacco Use  Smoking Status Current Every Day Smoker  . Packs/day: 0.00  . Years: 7.00  . Pack  years: 0.00  . Types: Cigarettes  Smokeless Tobacco Never Used  Tobacco Comment   1 cig./day    Social History   Substance and Sexual Activity  Alcohol Use Yes  . Alcohol/week: 1.0 standard drink  . Types: 1 Glasses of wine per week   Comment: 1 glass wine/day     No Known Allergies  Current Facility-Administered Medications  Medication Dose Route Frequency Provider Last Rate Last Admin  . acetaminophen (TYLENOL) tablet 650 mg  650 mg Oral Q6H PRN Jerline Pain, MD   650 mg at 10/19/19 1732   Or  . acetaminophen (TYLENOL) suppository 650 mg  650 mg Rectal Q6H PRN Jerline Pain, MD      . ceFAZolin (ANCEF) IVPB 2g/100 mL premix  2 g Intravenous Q8H Jerline Pain, MD 200 mL/hr at 10/22/19 1345 2 g at 10/22/19 1345  . dextroamphetamine (DEXTROSTAT) tablet 20 mg  20 mg Oral BID PRN Jerline Pain, MD      . diclofenac Sodium (VOLTAREN) 1 % topical gel 4 g  4 g Topical QID Jerline Pain, MD   4 g at 10/22/19 1400  . escitalopram (LEXAPRO) tablet 30 mg  30 mg Oral Daily Jerline Pain, MD   30 mg at 10/22/19 1129  . gabapentin (NEURONTIN) capsule 400 mg  400 mg Oral QHS Jerline Pain, MD   400 mg at 10/21/19 2137  . morphine 4 MG/ML injection 4-6 mg  4-6 mg Intravenous Q3H PRN Jerline Pain, MD   4 mg at 10/22/19 1346  . ondansetron (ZOFRAN) injection 4 mg  4 mg Intravenous Q6H PRN Jerline Pain, MD   4 mg at 10/21/19 2009  . oxyCODONE (Oxy IR/ROXICODONE) immediate release tablet 10 mg  10 mg Oral QID Jerline Pain, MD   10 mg at 10/22/19 1340  . scopolamine (TRANSDERM-SCOP) 1 MG/3DAYS 1.5 mg  1 patch Transdermal Q72H Jerline Pain, MD   1.5 mg at 10/21/19 1722    Medications Prior to Admission  Medication Sig Dispense Refill Last Dose  . dextroamphetamine (DEXTROSTAT) 10 MG tablet 3 po qam, 2 po qafternoon (Patient taking differently: Take 20-30 mg by mouth 2 (two) times daily as needed (ADHD *no more than 50mg /max per day*). ) 150 tablet 0 10/18/2019 at Unknown time  .  escitalopram (LEXAPRO) 20 MG tablet Take 1.5 tablets (30 mg total) by mouth daily. 135 tablet 1 10/18/2019 at Unknown time  . gabapentin (NEURONTIN) 400 MG capsule Take 1 capsule (400 mg total) by  mouth at bedtime. 90 capsule 1 10/18/2019 at Unknown time  . dextroamphetamine (DEXTROSTAT) 10 MG tablet Take 3 tablets (30 mg total) by mouth daily before breakfast AND 2 tablets (20 mg total) daily after lunch. (Patient not taking: Reported on 10/19/2019) 150 tablet 0 Not Taking at Unknown time  . dextroamphetamine (DEXTROSTAT) 10 MG tablet Take 3 tablets (30 mg total) by mouth daily before breakfast AND 2 tablets (20 mg total) daily after lunch. (Patient not taking: Reported on 10/19/2019) 150 tablet 0 Not Taking at Unknown time  . medroxyPROGESTERone Acetate 150 MG/ML SUSY Inject 1 mL (150 mg total) into the muscle every 3 (three) months. (Patient not taking: Reported on 05/28/2018) 1 mL 3     Family History  Problem Relation Age of Onset  . Other Brother        substance abuse  . Hyperlipidemia Maternal Grandfather   . Anxiety disorder Maternal Grandfather   . Alcoholism Maternal Grandfather   . Anxiety disorder Mother   . ADD / ADHD Father   . Drug abuse Father   . ADD / ADHD Brother   . Other Brother        bone marrow transplant  . Breast cancer Paternal Grandmother   . Aplastic anemia Brother   . Hypertension Maternal Grandmother   . Alcoholism Maternal Grandmother   . Diabetes Paternal Grandfather   . Cancer Paternal Grandfather   . Arrhythmia Brother   . Alcoholism Other   . Alcoholism Maternal Uncle      Review of Systems:   ROS    Cardiac Review of Systems: Y or  [    ]= no  Chest Pain [  x  ]  Resting SOB [   ] Exertional SOB  [ x ]  Orthopnea [  ]   Pedal Edema [   ]    Palpitations [  ] Syncope  [  ]   Presyncope [   ]  General Review of Systems: [Y] = yes [  ]=no Constitional: recent weight change [  ]; anorexia [ x ]; fatigue [  ]; nausea [  ]; night sweats [  ]; fever [ x  ]; or chills [  ]                                                                Eye : blurred vision [  ]; diplopia [   ]; vision changes [  ];  Amaurosis fugax[  ]; Resp: cough [  ];  wheezing[  ];  hemoptysis[  ]; shortness of breath[  ]; paroxysmal nocturnal dyspnea[  ]; dyspnea on exertion[  ]; or orthopnea[  ];  GI:  gallstones[  ], vomiting[  ];  dysphagia[  ]; melena[ x ];  hematochezia [  ]; heartburn[  ];   Hx of  Colonoscopy[  ]; GU: kidney stones [  ]; hematuria[  ];   dysuria [  ];  nocturia[  ];  history of     obstruction [  ]; urinary frequency [  ]             Skin: rash, swelling[  ];, hair loss[  ];  peripheral edema[  ];  or itching[  ]; Musculosketetal: myalgias[  ];  joint swelling[  ];  joint erythema[  ];  joint pain[  ];  back pain[  ];  Heme/Lymph: bruising[  ];  bleeding[ x ];  anemia[  ];  Neuro: TIA[  ];  headaches[  ];  stroke[  ];  vertigo[  ];  seizures[  ];   paresthesias[  ];  difficulty walking[  ];  Psych:depression[ x]; anxiety[  ];  Endocrine: diabetes[  ];  thyroid dysfunction[  ];                Physical Exam: BP (!) 92/54 (BP Location: Right Arm)   Pulse (!) 124   Temp 97.8 F (36.6 C) (Oral)   Resp (!) 22   Wt 57.5 kg   LMP 10/11/2019   SpO2 100%   BMI 20.46 kg/m    General appearance: alert, cooperative and mild distress, affect is flat. She is pale.  Head: Normocephalic, without obvious abnormality, atraumatic Teeth: grossly appear to be in good repair.  Neck: no adenopathy, no carotid bruit, no JVD, supple, symmetrical, trachea midline  Lymph nodes: no obvious adenopathy Resp: clear to auscultation bilaterally Cardio: Tachycardic, regular rhythm. No murmur or rub. GI: Soft, flat, no tenderness. Extremities: No obvious deformities or echymoses, all warm and well perfused.  Neurologic: Grossly normal  Diagnostic Studies & Laboratory data:     Recent Radiology Findings:   TRANSESOPHOGEAL ECHO REPORT       Patient Name:  BRANDEE MARKIN Date of Exam: 10/22/2019  Medical Rec #: [id number] Height:    66.0 in  Accession #:  [id number] Weight:    126.8 lb  Date of Birth: 07/03/1990 BSA:     1.647 m  Patient Age:  29 years  BP:      124/92 mmHg  Patient Gender: F     HR:      73 bpm.  Exam Location: Inpatient   Procedure: 3D Echo, Transesophageal Echo, Color Doppler and Cardiac  Doppler   Indications:   bactermia    History:     Patient has prior history of Echocardiogram examinations,  most          recent 10/21/2019. Sepsis.    Sonographer:   Johny Chess  Referring Phys: [id number] [place]  Diagnosing Phys: Candee Furbish MD   PROCEDURE: After discussion of the risks and benefits of a TEE, an  informed consent was obtained from the patient. The transesophogeal probe  was passed without difficulty through the esophogus of the patient.  Sedation performed by different physician.  The patient was monitored while under deep sedation. Anesthestetic  sedation was provided intravenously by Anesthesiology: 418mg  of Propofol.  The patient developed no complications during the procedure.   IMPRESSIONS    1. Left ventricular ejection fraction, by estimation, is 60 to 65%. The  left ventricle has normal function.  2. Right ventricular systolic function is normal. The right ventricular  size is normal.  3. No left atrial/left atrial appendage thrombus was detected.  4. The mitral valve is normal in structure. Trivial mitral valve  regurgitation.  5. There is a large 2 x 1.5 cm spherical vegetation attached to the  lateral chordal valvular support structure within the right ventricle.  There is also an elongated 2cm x 0.5cm mobile vegetation attached to above  vegetation. The tricuspid valve is  abnormal.  6. The aortic valve is normal in structure. Aortic valve regurgitation is  not visualized. No aortic stenosis is present.    Conclusion(s)/Recommendation(s):  Findings concerning for tricuspid valve  vegetation.   FINDINGS  Left Ventricle: Left ventricular ejection fraction, by estimation, is 60  to 65%. The left ventricle has normal function. The left ventricular  internal cavity size was normal in size.   Right Ventricle: The right ventricular size is normal. No increase in  right ventricular wall thickness. Right ventricular systolic function is  normal.   Left Atrium: Left atrial size was normal in size. No left atrial/left  atrial appendage thrombus was detected.   Right Atrium: Right atrial size was normal in size.   Pericardium: There is no evidence of pericardial effusion.   Mitral Valve: The mitral valve is normal in structure. Trivial mitral  valve regurgitation.   Tricuspid Valve: There is a large 2 x 1.5 cm spherical vegetation attached  to the lateral chordal valvular support structure within the right  ventricle. There is also an elongated 2cm x 0.5cm mobile vegetation  attached to above vegetation. The tricuspid  valve is abnormal. Tricuspid valve regurgitation is mild.   Aortic Valve: The aortic valve is normal in structure. Aortic valve  regurgitation is not visualized. No aortic stenosis is present.   Pulmonic Valve: The pulmonic valve was normal in structure. Pulmonic valve  regurgitation is not visualized.   Aorta: The aortic root is normal in size and structure.   IAS/Shunts: No atrial level shunt detected by color flow Doppler.   Candee Furbish MD  Electronically signed by Candee Furbish MD  Signature Date/Time: 10/22/2019/2:13:01 PM    I have independently reviewed the above radiologic studies and discussed with the patient   Recent Lab Findings: Lab Results  Component Value Date   WBC 25.1 (H) 10/21/2019   HGB 9.2 (L) 10/21/2019   HCT 27.6 (L) 10/21/2019   PLT 244 10/21/2019   GLUCOSE 110 (H) 10/22/2019   CHOL 193 09/13/2016   TRIG 275 (H) 09/13/2016   HDL 115  09/13/2016   LDLCALC 23 09/13/2016   ALT 15 10/22/2019   AST 22 10/22/2019   NA 137 10/22/2019   K 3.5 10/22/2019   CL 106 10/22/2019   CREATININE 0.48 10/22/2019   BUN 13 10/22/2019   CO2 21 (L) 10/22/2019   TSH 1.310 09/13/2016   INR 0.92 05/03/2015      Assessment / Plan:    29yof admitted 3 days ago with MSSA bacteremia and multifocal pulmonary consolidation.  TEE obtained earlier today demonstrates a complex 1.5 x 2cm mobile vegetation on the tricuspid valve and a secondary smaller vegetation attached to the primary one. The tricuspid valve is structurally competent with only mild TR.  It has also become apparent today that she has an active GI bleed with pending work up.   Ms. Lafont was only minimally engaged in the interview today but did express that she understood the seriousness of valvular endocarditis and the importance of addressing the vegetations. This lesion is not likely causing any hemodynamic compromise and there is no indication for emergent intervention. Once the GI bleed has been proven to be under control we can consider removal of the vegetations percutaneously with the AngioVac device.   Dr. Kipp Brood will review the case and discuss timing of intervention.  We will follow with you.   I  spent 20 minutes counseling the patient face to face.   Antony Odea, PA-C  10/22/2019 3:43 PM   Agree with above. This is a 29 year old female that presents with MSSA bacteremia, tricuspid valve endocarditis and multiple septic emboli  to the lung.  The TEE shows a subvalvular 2 cm mobile vegetation.  She also has a pretty significant GI bleed which is required multiple transfusions this admission.  From a surgical standpoint given her history of ongoing IV drug abuse I do not think that an open debridement of her subvalvular apparatus would be warranted.  I will need to discuss this case to determine whether or not this vegetation can be debrided given its position in  the right ventricle.  If possible this will need to be done once she is stabilized from a GI standpoint given that she will need to be heparinized for the angio VAC debridement.  Gyselle Matthew Bary Leriche

## 2019-10-22 NOTE — Progress Notes (Signed)
MD at bedside to assess patient D/T rectal bleeding and low hemoglobin. MD placed orders for progressive care. Rapid Response RN notified and made aware of patient.

## 2019-10-22 NOTE — Transfer of Care (Signed)
Immediate Anesthesia Transfer of Care Note  Patient: Suzanne [name]  Procedure(s) Performed: TRANSESOPHAGEAL ECHOCARDIOGRAM (TEE) (N/A )  Patient Location: PACU and Endoscopy Unit  Anesthesia Type:MAC  Level of Consciousness: drowsy  Airway & Oxygen Therapy: Patient Spontanous Breathing and Patient connected to nasal cannula oxygen  Post-op Assessment: Report given to RN and Post -op Vital signs reviewed and stable  Post vital signs: Reviewed and stable  Last Vitals:  Vitals Value Taken Time  BP 96/47 10/22/19 0929  Temp    Pulse 72 10/22/19 0930  Resp 26 10/22/19 0930  SpO2 100 % 10/22/19 0930  Vitals shown include unvalidated device data.  Last Pain:  Vitals:   10/22/19 0749  TempSrc: Oral  PainSc: 0-No pain      Patients Stated Pain Goal: 2 (79/89/21 1941)  Complications: No complications documented.

## 2019-10-22 NOTE — Progress Notes (Signed)
Pt off the unit in endo ? ?

## 2019-10-22 NOTE — Progress Notes (Signed)
  Echocardiogram Echocardiogram Transesophageal has been performed.  Johny Chess 10/22/2019, 10:44 AM

## 2019-10-23 ENCOUNTER — Encounter (HOSPITAL_COMMUNITY): Payer: Self-pay | Admitting: Internal Medicine

## 2019-10-23 ENCOUNTER — Inpatient Hospital Stay (HOSPITAL_COMMUNITY): Payer: Self-pay | Admitting: Critical Care Medicine

## 2019-10-23 ENCOUNTER — Encounter (HOSPITAL_COMMUNITY)
Admission: EM | Disposition: A | Discharge status: Home / Self Care | Payer: Self-pay | Source: Home / Self Care | Attending: Internal Medicine

## 2019-10-23 DIAGNOSIS — R000 Tachycardia, unspecified: Secondary | ICD-10-CM

## 2019-10-23 HISTORY — PX: ESOPHAGOGASTRODUODENOSCOPY (EGD) WITH PROPOFOL: SHX5813

## 2019-10-23 HISTORY — PX: BIOPSY: SHX5522

## 2019-10-23 LAB — C DIFFICILE (CDIFF) QUICK SCRN (NO PCR REFLEX)
C Diff antigen: NEGATIVE
C Diff interpretation: NOT DETECTED
C Diff toxin: NEGATIVE

## 2019-10-23 LAB — CBC
HCT: 19.7 % — ABNORMAL LOW (ref 36.0–46.0)
Hemoglobin: 6.9 g/dL — CL (ref 12.0–15.0)
MCH: 29.4 pg (ref 26.0–34.0)
MCHC: 35 g/dL (ref 30.0–36.0)
MCV: 83.8 fL (ref 80.0–100.0)
Platelets: 265 10*3/uL (ref 150–400)
RBC: 2.35 MIL/uL — ABNORMAL LOW (ref 3.87–5.11)
RDW: 14 % (ref 11.5–15.5)
WBC: 37.5 10*3/uL — ABNORMAL HIGH (ref 4.0–10.5)
nRBC: 0.1 % (ref 0.0–0.2)

## 2019-10-23 LAB — PROTIME-INR
INR: 1.3 — ABNORMAL HIGH (ref 0.8–1.2)
Prothrombin Time: 15.6 seconds — ABNORMAL HIGH (ref 11.4–15.2)

## 2019-10-23 LAB — HCV INTERPRETATION

## 2019-10-23 LAB — PREPARE RBC (CROSSMATCH)

## 2019-10-23 LAB — HEMOGLOBIN AND HEMATOCRIT, BLOOD
HCT: 25.2 % — ABNORMAL LOW (ref 36.0–46.0)
Hemoglobin: 8.9 g/dL — ABNORMAL LOW (ref 12.0–15.0)

## 2019-10-23 LAB — HCV AB W REFLEX TO QUANT PCR: HCV Ab: <0.1 s/co ratio (ref 0.0–0.9)

## 2019-10-23 LAB — APTT: aPTT: 30 seconds (ref 24–36)

## 2019-10-23 SURGERY — ESOPHAGOGASTRODUODENOSCOPY (EGD) WITH PROPOFOL
Anesthesia: Monitor Anesthesia Care

## 2019-10-23 MED ORDER — ONDANSETRON HCL 4 MG/2ML IJ SOLN
INTRAMUSCULAR | Status: DC | PRN
  Administered 2019-10-23: 4 mg via INTRAVENOUS

## 2019-10-23 MED ORDER — PROPOFOL 500 MG/50ML IV EMUL
INTRAVENOUS | Status: DC | PRN
  Administered 2019-10-23: 125 ug/kg/min via INTRAVENOUS

## 2019-10-23 MED ORDER — SODIUM CHLORIDE 0.9% IV SOLUTION: Freq: Once | INTRAVENOUS | Status: AC

## 2019-10-23 MED ORDER — SODIUM CHLORIDE 0.9 % IV SOLN: INTRAVENOUS | Status: DC

## 2019-10-23 MED ORDER — PROPOFOL 10 MG/ML IV BOLUS
INTRAVENOUS | Status: DC | PRN
  Administered 2019-10-23 (×2): 20 mg via INTRAVENOUS

## 2019-10-23 MED ORDER — LACTATED RINGERS IV SOLN: INTRAVENOUS | Status: DC

## 2019-10-23 SURGICAL SUPPLY — 15 items

## 2019-10-23 NOTE — Transfer of Care (Signed)
Immediate Anesthesia Transfer of Care Note  Patient: Suzanne [name]  Procedure(s) Performed: ESOPHAGOGASTRODUODENOSCOPY (EGD) WITH PROPOFOL (N/A ) BIOPSY  Patient Location: PACU  Anesthesia Type:MAC  Level of Consciousness: drowsy  Airway & Oxygen Therapy: Patient Spontanous Breathing  Post-op Assessment: Report given to RN and Post -op Vital signs reviewed and stable  Post vital signs: Reviewed and stable  Last Vitals:  Vitals Value Taken Time  BP    Temp    Pulse    Resp    SpO2      Last Pain:  Vitals:   10/23/19 1405  TempSrc: Oral  PainSc: 4       Patients Stated Pain Goal: 0 (67/59/16 3846)  Complications: No complications documented.

## 2019-10-23 NOTE — Anesthesia Preprocedure Evaluation (Addendum)
Anesthesia Evaluation  Patient identified by MRN, date of birth, ID band Patient awake    Reviewed: Allergy & Precautions, NPO status , Patient's Chart, lab work & pertinent test results  Airway Mallampati: I  TM Distance: >3 FB Neck ROM: Full    Dental  (+) Teeth Intact, Dental Advisory Given   Pulmonary Current Smoker and Patient abstained from smoking.,    breath sounds clear to auscultation       Cardiovascular negative cardio ROS   Rhythm:Regular Rate:Normal     Neuro/Psych PSYCHIATRIC DISORDERS Depression    GI/Hepatic negative GI ROS, Neg liver ROS,   Endo/Other  negative endocrine ROS  Renal/GU negative Renal ROS     Musculoskeletal negative musculoskeletal ROS (+)   Abdominal Normal abdominal exam  (+)   Peds  Hematology negative hematology ROS (+)   Anesthesia Other Findings   Reproductive/Obstetrics                            Anesthesia Physical Anesthesia Plan  ASA: II  Anesthesia Plan: MAC   Post-op Pain Management:    Induction: Intravenous  PONV Risk Score and Plan: 0 and Propofol infusion  Airway Management Planned: Simple Face Mask and Natural Airway  Additional Equipment: None  Intra-op Plan:   Post-operative Plan:   Informed Consent: I have reviewed the patients History and Physical, chart, labs and discussed the procedure including the risks, benefits and alternatives for the proposed anesthesia with the patient or authorized representative who has indicated his/her understanding and acceptance.       Plan Discussed with: CRNA  Anesthesia Plan Comments: (Echo:  1. Left ventricular ejection fraction, by estimation, is 60 to 65%. The  left ventricle has normal function. The left ventricle has no regional  wall motion abnormalities. Left ventricular diastolic parameters were  normal.  2. Right ventricular systolic function is normal. The right  ventricular  size is normal. Tricuspid regurgitation signal is inadequate for assessing  PA pressure.  3. The mitral valve is normal in structure. Trivial mitral valve  regurgitation. No evidence of mitral stenosis.  4. The aortic valve is tricuspid. Aortic valve regurgitation is not  visualized. No aortic stenosis is present.  5. The inferior vena cava is normal in size with greater than 50%  respiratory variability, suggesting right atrial pressure of 3 mmHg)       Anesthesia Quick Evaluation

## 2019-10-23 NOTE — Anesthesia Postprocedure Evaluation (Signed)
Anesthesia Post Note  Patient: Kazandra Forstrom  Procedure(s) Performed: ESOPHAGOGASTRODUODENOSCOPY (EGD) WITH PROPOFOL (N/A ) BIOPSY     Patient location during evaluation: PACU Anesthesia Type: MAC Level of consciousness: awake and alert Pain management: pain level controlled Vital Signs Assessment: post-procedure vital signs reviewed and stable Respiratory status: spontaneous breathing, nonlabored ventilation, respiratory function stable and patient connected to nasal cannula oxygen Cardiovascular status: stable and blood pressure returned to baseline Postop Assessment: no apparent nausea or vomiting Anesthetic complications: no   No complications documented.  Last Vitals:  Vitals:   10/23/19 1507 10/23/19 1547  BP: 113/60 115/66  Pulse: 75   Resp: 15   Temp:    SpO2: 100% 100%    Last Pain:  Vitals:   10/23/19 1507  TempSrc:   PainSc: [place] [place]

## 2019-10-23 NOTE — Progress Notes (Signed)
Pt came back from Endo, tolerating full liquid diet well. Will advance as tolerated.

## 2019-10-23 NOTE — Op Note (Signed)
[facility] Patient Name: Suzanne [name] Procedure Date : 10/23/2019 MRN: [id number] Attending MD: Juanita Craver , MD Date of Birth: 05/13/1990 CSN: [id number] Age: 29 Admit Type: Inpatient Procedure:                EGD with gastric biopsies. Indications:              Acute post hemorrhagic anemia, Melena Providers:                Juanita Craver, MD, Josie Dixon, RN, Tyna Jaksch                            Technician, Rejeana Brock, CRNA Referring MD:             Terance Hart. Caryn Section, MD-Internal medicine teaching                            service. Medicines:                Monitored Anesthesia Care Complications:            No immediate complications. Estimated Blood Loss:     Estimated blood loss was minimal. Procedure:                Pre-Anesthesia Assessment: - Prior to the                            procedure, a history and physical was performed,                            and patient medications and allergies were                            reviewed. The patient's tolerance of previous                            anesthesia was also reviewed. The risks and                            benefits of the procedure and the sedation options                            and risks were discussed with the patient. All                            questions were answered, and informed consent was                            obtained. Prior Anticoagulants: The patient has                            taken no previous anticoagulant or antiplatelet                            agents. ASA Grade Assessment: III - A patient with  severe systemic disease. After reviewing the risks                            and benefits, the patient was deemed in                            satisfactory condition to undergo the procedure.                            After obtaining informed consent, the endoscope was                            passed under direct vision. Throughout the                             procedure, the patient's blood pressure, pulse, and                            oxygen saturations were monitored continuously. The                            [id number]-H190 ([id number]) Olympus gastroscope was                            introduced through the mouth, and advanced to the                            second part of duodenum. The EGD was accomplished                            without difficulty. The patient tolerated the                            procedure well. Scope In: Scope Out: Findings:      LA Grade A (one or more mucosal breaks less than 5 mm, not extending       between tops of 2 mucosal folds) esophagitis with no bleeding was found       35 cm from the incisors.      A small-sized hiatal hernia was noted on retroflexion.      Three 6-7-8 mm non-bleeding gastric ulcers-Forrest III       classifciation-with no stigmata of bleeding were found in the prepyloric       region of the stomach-biopsies were done to rule out H. pylori by       pathology.      Many superficial duodenal ulcers were found in the duodenal bulb.      The first portion of the duodenum and second portion of the duodenum       were normal. Impression:               - LA Grade A reflux esophagitis with no bleeding.                           - Small sized hiatal hernia.                           -  Three gastric ulcers with no stigmata of                            bleeding-biopsied to rule out H. pylori by                            pathology.                           - Multiple superficial duodenal bulb ulcers.                           - Normal first portion of the duodenum and second                            portion of the duodenum. Moderate Sedation:      MAC used. Recommendation:           - Full liquid diet today; advance as tolerated.                           - Change from Protonix drip to oral Protonix.                           - Continue present medications; avoid all  NSAIDS                            including Goodies, BC's etc.                           - Await pathology results.                           - Observe patient's clinical course. Procedure Code(s):        --- Professional ---                           [phone], Esophagogastroduodenoscopy, flexible,                            transoral; with biopsy, single or multiple Diagnosis Code(s):        --- Professional ---                           D62, Acute posthemorrhagic anemia                           K92.1, Melena (includes Hematochezia)                           K25.9, Gastric ulcer, unspecified as acute or                            chronic, without hemorrhage or perforation                           K26.4, Chronic or unspecified duodenal ulcer with  hemorrhage                           K21.00, Gastro-esophageal reflux disease with                            esophagitis, without bleeding                           K44.9, Diaphragmatic hernia without obstruction or                            gangrene CPT copyright 2019 American [employer]. All rights reserved. The codes documented in this report are preliminary and upon coder review may  be revised to meet current compliance requirements. Juanita Craver, MD Juanita Craver, MD 10/23/2019 3:24:44 PM This report has been signed electronically. Number of Addenda: 0

## 2019-10-23 NOTE — Progress Notes (Signed)
Subjective:   Patient was seen and evaluated at bedside on morning rounds. She finished third unit of blood transfusion this AM.  She endorses that she is better today. She still has bloody bowel movement. No abdominal pain, nausea or vomiting. She only has 1 IV line and has refused second IV line placement despite multiple discussion about her clinical situation. She is agreeable with further transfusion and with EGD today but clearly refuses getting another IV line. Her mother is in the room and tries to convince her but patient becomes angry.    Objective:  Vital signs in last 24 hours: Vitals:   10/23/19 0754 10/23/19 1207 10/23/19 1230 10/23/19 1405  BP: 120/62 119/69 128/75 (!) 110/59  Pulse: (!) 109 98 79 88  Resp:  16 16 12   Temp:  98.3 F (36.8 C) 98.2 F (36.8 C) 98.8 F (37.1 C)  TempSrc:  Oral Oral Oral  SpO2: 100% 100% 100% 100%  Weight:    62.5 kg  Height:    5\' 6"  (1.676 m)   General: Mild palor (improved today), resting in bed comfortably Respiratory: Lungs are CTA, bilaterally.  Cardiovascular: Tachycardic, rhythm is regular. No murmurs, rubs, or gallops Abdominal: Abdomen is soft and non-distended with no TTP. No guarding or rebound.  Psychiatric: Flat affect, verbally aggressive  CBC Latest Ref Rng & Units 10/23/2019 10/22/2019 10/21/2019  WBC 4.0 - 10.5 K/uL 37.5(H) 29.9(H) 25.1(H)  Hemoglobin 12.0 - 15.0 g/dL 6.9(LL) 4.3(LL) 9.2(L)  Hematocrit 36 - 46 % 19.7(L) 13.6(L) 27.6(L)  Platelets 150 - 400 K/uL 265 304 244   CMP Latest Ref Rng & Units 10/22/2019 10/21/2019 10/20/2019  Glucose 70 - 99 mg/dL 110(H) 117(H) 114(H)  BUN 6 - 20 mg/dL 13 12 19   Creatinine 0.44 - 1.00 mg/dL 0.48 0.63 0.81  Sodium 135 - 145 mmol/L 137 136 135  Potassium 3.5 - 5.1 mmol/L 3.5 3.6 2.9(L)  Chloride 98 - 111 mmol/L 106 103 103  CO2 22 - 32 mmol/L 21(L) 19(L) 19(L)  Calcium 8.9 - 10.3 mg/dL 8.1(L) 8.8(L) 8.9  Total Protein 6.5 - 8.1 g/dL 5.3(L) 6.1(L) -  Total Bilirubin 0.3 - 1.2  mg/dL 0.3 0.2(L) -  Alkaline Phos 38 - 126 U/L 104 156(H) -  AST 15 - 41 U/L 22 21 -  ALT 0 - 44 U/L 15 12 -   I/O last 3 completed shifts: In: 4388.6 [P.O.:600; I.V.:589.2; Blood:1449; IV Piggyback:1750.5] Out: 0  Total I/O In: 250 [Blood:250] Out: -     Assessment/Plan:  Principal Problem:   MSSA bacteremia Active Problems:   Shortness of breath   Sepsis ([place])   Right shoulder pain   Acute septic pulmonary embolism ([facility])   Endocarditis  1. Active upper GI bleeding  Noticed dark red bowel movement 9/10. Was associated with tachycardia and soft BP. Hb dropped to 4.3 yesterday (from 9.2). No prior Hx of GI bleeding. Pt has endorsed Hx of NSAID use. Denies Alcohol use. S/p 3u of pRBC transfusion. Patient continues to have loose bloody stool today.  No abdominal pain, N/V and abdominal exam are benign. HD stable and clinically better than yesterday after receiving 3 units of pRBC transfusion. Post transfusion Hb today was 6.9. GI saw the patient yesterday and planned for EGD today. Will transfuse another unit of pRBC and then EGD today. Pt is on PPI. Bedside RUQ [place] yesterday did not show cirrhosis.  -transfuse 1 unit of pRBC -EGD today after transfusion -Continue PPI drip -Appreciate GI  rec -Monitor CBC -Avoid NSAID -Monitor VS -IV team consulted for second IV line. Patient has been refusing  2. 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  - Continue Cefazoline (9/7>>) -ID is on board. Appreciate recommendations -Ct surgery consult when acute anemia/GI bleeding resolves -F/u second BC 9/10 - Zofran PRN for nausea with scopolamine patch, monitor for QTc prolongation - Pt refused to have shoulder aspiration  Dewayne Hatch, MD 10/23/2019, 2:54 PM Pager: [phone] After 5pm on weekdays and 1pm on weekends: On Call pager  [phone]

## 2019-10-24 DIAGNOSIS — K922 Gastrointestinal hemorrhage, unspecified: Secondary | ICD-10-CM

## 2019-10-24 LAB — COMPREHENSIVE METABOLIC PANEL
ALT: 11 U/L (ref 0–44)
AST: 19 U/L (ref 15–41)
Albumin: 1.6 g/dL — ABNORMAL LOW (ref 3.5–5.0)
Alkaline Phosphatase: 53 U/L (ref 38–126)
Anion gap: 9 (ref 5–15)
BUN: <5 mg/dL — ABNORMAL LOW (ref 6–20)
CO2: 23 mmol/L (ref 22–32)
Calcium: 7.7 mg/dL — ABNORMAL LOW (ref 8.9–10.3)
Chloride: 107 mmol/L (ref 98–111)
Creatinine, Ser: 0.57 mg/dL (ref 0.44–1.00)
GFR calc Af Amer: >60 mL/min (ref >60)
GFR calc non Af Amer: >60 mL/min (ref >60)
Glucose, Bld: 96 mg/dL (ref 70–99)
Potassium: 3.7 mmol/L (ref 3.5–5.1)
Sodium: 139 mmol/L (ref 135–145)
Total Bilirubin: 0.2 mg/dL — ABNORMAL LOW (ref 0.3–1.2)
Total Protein: 4.2 g/dL — ABNORMAL LOW (ref 6.5–8.1)

## 2019-10-24 LAB — PREPARE RBC (CROSSMATCH)

## 2019-10-24 LAB — CBC
HCT: 19.9 % — ABNORMAL LOW (ref 36.0–46.0)
Hemoglobin: 6.7 g/dL — CL (ref 12.0–15.0)
MCH: 29.6 pg (ref 26.0–34.0)
MCHC: 33.7 g/dL (ref 30.0–36.0)
MCV: 88.1 fL (ref 80.0–100.0)
Platelets: 302 10*3/uL (ref 150–400)
RBC: 2.26 MIL/uL — ABNORMAL LOW (ref 3.87–5.11)
RDW: 14.6 % (ref 11.5–15.5)
WBC: 24.3 10*3/uL — ABNORMAL HIGH (ref 4.0–10.5)
nRBC: 0 % (ref 0.0–0.2)

## 2019-10-24 MED ORDER — SODIUM CHLORIDE 0.9% IV SOLUTION: Freq: Once | INTRAVENOUS | Status: DC

## 2019-10-24 MED ORDER — PANTOPRAZOLE SODIUM 40 MG PO TBEC
40.0000 mg | DELAYED_RELEASE_TABLET | Freq: Two times a day (BID) | ORAL | Status: DC
  Administered 2019-10-24 – 2019-10-29 (×11): 40 mg via ORAL
  Filled 2019-10-24 (×11): qty 1

## 2019-10-24 MED ORDER — MORPHINE SULFATE (PF) 4 MG/ML IV SOLN
4.0000 mg | Freq: Four times a day (QID) | INTRAVENOUS | Status: DC | PRN
  Administered 2019-10-25 – 2019-10-27 (×7): 4 mg via INTRAVENOUS
  Filled 2019-10-24 (×8): qty 1

## 2019-10-24 MED ORDER — SODIUM CHLORIDE 0.9% IV SOLUTION: Freq: Once | INTRAVENOUS | Status: AC

## 2019-10-24 NOTE — Progress Notes (Signed)
UNASSIGNED PATIENT [facility]GI Subjective: Since I last evaluated the patient, she continues to be stable from a GI standpoint but there has been drop in her hemoglobin from 8.9 g/dL yesterday to 6.7 g/dL today without any evidence of obvious melena hematochezia.  On EGD done yesterday she was found to have mild esophagitis with gastric ulcers and duodenal ulcers but there was no fresh or old heme in the upper GI tract.. Patient's mother is at the bedside is requesting that will be emphasized with the patient about cooperation with the medical staff for further treatment and evaluation.  Patient denies having any nausea, vomiting or abdominal pain at this time.    Objective: Vital signs in last 24 hours: Temp:  [98 F (36.7 C)-98.9 F (37.2 C)] 98.9 F (37.2 C) (09/12 1100) Pulse Rate:  [69-95] 85 (09/12 0502) Resp:  [14-18] 18 (09/12 1100) BP: (110-126)/(60-77) 115/67 (09/12 1100) SpO2:  [100 %] 100 % (09/11 1547) Weight:  [62.5 kg] 62.5 kg (09/12 0502) Last BM Date: 10/23/19  Intake/Output from previous day: 09/11 0701 - 09/12 0700 In: 917.3 [P.O.:240; I.V.:193.9; Blood:250; IV Piggyback:233.4] Out: -  Intake/Output this shift: No intake/output data recorded.  General appearance: alert, fatigued, no distress and pale Resp: clear to auscultation bilaterally Cardio: regular rate and rhythm, S1, S2 normal, no murmur, click, rub or gallop GI: soft, non-tender; bowel sounds normal; no masses,  no organomegaly Extremities: extremities normal, atraumatic, no cyanosis or edema  Lab Results: Recent Labs    10/22/19 1555 10/22/19 1555 10/23/19 0720 10/23/19 1623 10/24/19 1125  WBC 29.9*  --  37.5*  --  24.3*  HGB 4.3*   < > 6.9* 8.9* 6.7*  HCT 13.6*   < > 19.7* 25.2* 19.9*  PLT 304  --  265  --  302   < > = values in this interval not displayed.   BMET Recent Labs    10/22/19 1219 10/24/19 1125  NA 137 139  K 3.5 3.7  CL 106 107  CO2 21* 23  GLUCOSE 110* 96  BUN 13 <5*   CREATININE 0.48 0.57  CALCIUM 8.1* 7.7*   LFT Recent Labs    10/24/19 1125  PROT 4.2*  ALBUMIN 1.6*  AST 19  ALT 11  ALKPHOS 53  BILITOT 0.2*   PT/INR Recent Labs    10/23/19 0720  LABPROT 15.6*  INR 1.3*   Hepatitis Panel Recent Labs    10/22/19 1219  HEPBSAG NON REACTIVE   C-Diff Recent Labs    10/23/19 1111  CDIFFTOX NEGATIVE   Studies/Results: US Abdomen Limited RUQ  Result Date: 10/22/2019 CLINICAL DATA:  Rule out cirrhosis. EXAM: ULTRASOUND ABDOMEN LIMITED RIGHT UPPER QUADRANT COMPARISON:  None. FINDINGS: Gallbladder: A shadowing echogenic focus is seen within the fundus of a poorly distended gallbladder (the patient is not NPO, as per the ultrasound technologist). There is no evidence of gallbladder wall thickening (2.3 mm). No sonographic Murphy sign noted by sonographer. Common bile duct: Diameter: 4.7 mm Liver: The liver measures 14.2 cm in length. No focal lesion identified. Within normal limits in parenchymal echogenicity. Portal vein is patent on color Doppler imaging with normal direction of blood flow towards the liver. Other: N/A IMPRESSION: 1. Findings suggestive of cholelithiasis without evidence of acute cholecystitis. 2. No evidence of cirrhosis. Electronically Signed   By: Virgina Norfolk M.D.   On: 10/22/2019 19:37   Medications: I have reviewed the patient's current medications.  Assessment/Plan: 1) Severe anemia with history of melena hematochezia-multiple  gastric and duodenal ulcers noted on endoscopy done yesterday. Patient however continues to drop her hemoglobin therefore she may need a colonoscopy if her hemoglobin drops again tomorrow.  Dr. Thornton Park is covering that the [place] tomorrow will make recommendations as needed.  I was planning the patient advance the patient's diet but in light of her recent CBC findings plans are to keep her on clear liquid diet in case she needs a colonoscopy sooner than later. 2) MSSA  bacteremia with tricuspid valve endocarditis, septic pulmonary emboli and septic shoulder. TEE revealed 2.0 x 1.5 cm vegetation on the tricuspid valve. She is presently on Ancef. 3) IV drug abuse on heroin.   LOS: 5 days   Juanita Craver 10/24/2019, 2:25 PM

## 2019-10-24 NOTE — TOC Progression Note (Signed)
Transition of Care [facility]) - Progression Note    Patient Details  Name: Suzanne [name] MRN: [id number] Date of Birth: April 08, 1990  Transition of [facility] CM/SW [place], [place] Phone Number: 10/24/2019, 11:46 AM  Clinical Narrative:      CSW spoke with patient and patients mother at bedside. CSW offered Outpatient substance abuse treatment services resources. Patient accepted. Patient requested Medicaid application as well as resources for therapy. CSW provided patient with medicaid application as well as Psychiatry and Counseling resouces. All concerns addressed.  CSW will continue to follow.      Expected Discharge Plan and Services                                                 Social Determinants of Health (SDOH) Interventions    Readmission Risk Interventions No flowsheet data found.

## 2019-10-24 NOTE — Progress Notes (Signed)
Reminded resident about putting an order to transfuse 1u of blood

## 2019-10-24 NOTE — Progress Notes (Signed)
Pt refusing labs twice. Resident updated.

## 2019-10-24 NOTE — Progress Notes (Addendum)
Subjective:  Today, Ms. Camarena states that she is feeling much better after her blood transfusions last night. She says she has had 3 episodes of diarrhea overnight, but is unsure whether they were bloody or not. She says her nausea has resolved and denies vomiting. She says that her pain is well-controlled currently. She specifically denies any abdominal pain and says her right shoulder pain has improved with increased range of motion. She denies any fevers, chills, SOB, light-headedness, dizziness or any other symptoms. She says she would like her care and progress discussed with her mother.  Objective:  Vital signs in last 24 hours: Vitals:   10/23/19 1547 10/23/19 1948 10/24/19 0502 10/24/19 1100  BP: 115/66 110/68 112/77 115/67  Pulse:  95 85   Resp:  18 18 18   Temp:  98 F (36.7 C) 98 F (36.7 C) 98.9 F (37.2 C)  TempSrc:  Oral Oral Oral  SpO2: 100%     Weight:   62.5 kg   Height:       Weight change: 0 kg  Intake/Output Summary (Last 24 hours) at 10/24/2019 1324 Last data filed at 10/23/2019 2359 Gross per 24 hour  Intake 917.29 ml  Output --  Net 917.29 ml   General: Patient appears ill but laying comfortably in NAD. Eyes: Sclera mildly icteric. No conjunctival injection.  Respiratory: Lungs are CTA, bilaterally. No wheezes, rales, or rhonchi.  Cardiovascular: Regular rate and rhythm. There is a 1/6 blowing systolic murmur present along the left sternal border. No rubs or gallops. Patient has diffuse, generalized, non-pitting edema in all four extremities. Abdominal: Mildly distended but soft, without TTP, guarding, or rebound. Skin: Pallor present but improved since yesterday. No lesions. No rashes.  Musculoskeletal: No apparent right shoulder TTP with improved active ROM of right shoulder. Psych: Flat affect. Soft tone of voice.  Assessment/Plan:  Principal Problem:   MSSA bacteremia Active Problems:   Shortness of breath   Sepsis ([place])   Right shoulder  pain   Acute septic pulmonary embolism (HCC)   Endocarditis  # MSSA bacteremia with TV endocarditis, septic pulmonary emboly, and likely septic shoulder Patient presented with 5 days of CP, SOB, fever, with hx IVDU and septic emboli on CTA chest.  Blood cultures positive for MSSA. TTE performed 10/21/19 unremarkable. 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 HIV, HepBsAg and HCV Ab negative. - ID on board. Grateful for their recommendations. - Continue IV Ancef, Day 6 - Repeat blood cultures drawn 10/22/19 show no growth; continue to monitor - Patient currently refusing morning labs but states she will agree to labs drawn later today. Will hold off on calling CT surgery for possible Angiovac of TV until CBC able to be obtained in the setting of unstable GIB overnight - Will attempt repeat CBC / CMP today - Will continue Oxy IR 10mg  q6 for pain and decrease frequency of morphine 4mg  from q3 to q6hrs PRN for breakthrough pain - continue Scopolamine patch x3 days for nausea with PRN Zofran as needed  # GI Bleeding of Unknown Source Patient had multiple, acute episodes of bloody maroonish-brown diarrhea 10/22/19. Hemoglobin dropped to 4.3 with unstable vital signs and she was given 3 units of PRBC and LR boluses until tachycardia resolved. Yesterday afternoon, Hgb 8.9. Vitals over the past 24 hours have remained stable, WNL. C. Diff testing negative. EGD 10/23/19 showed L:A Grade A  esophagitis, small hiatal hernia, three 6-7-71mm non-bleeding gastric ulcers, and many superficial duodenal ulcers in the duodenal bulb without any signs of active bleeding, likely 2/2 frequent NSAID use although bx obtained to r/o H. Pylori. Diarrhea most likely secondary to opioid withdrawal given hx of regular IV heroin use and improving nausea/agitation today.  - Patient agreeable to CBC / labs later this morning - If  GI Bleeding continues, patient will likely require colonoscopy to rule out active lower GI bleeding given no upper GI source - F/U biopsy to r/o H. Pylori - continue to monitor vitals / morning CBC's  R Shoulder Pain with concern for Septic Joint: R shoulder pain improved today with improved ROM.  - Continue Ancef - Will discuss MRI again once bleeding resolves - Pain control as above   Nausea/Vomiting: Improved with scopolamine patch. - Continue with scopolamine and PRN Zofran - Prefer scopolamine alone if sufficient given borderline prolonged QTc   MDD: Patient is on Lexapro at home. More agreeable and conversational today. 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   - Encouraged nursing staff to provide morphine as needed for breakthrough pain for now  - Will discuss transitioning to suboxone or Methadone once testing has been completed  Addendum 10/24/19 @ 1:16pm: Patient had a single watery bowel movement earlier today with urination with small volume of what appeared to be blood in her commode. Hgb dropped from 8.9 yesterday to 6.7 today. Patient denies other bloody bowel movements today since the three episodes she noted last night. She denies symptoms other than generalized pain at this time. No abdominal pain in particular.  - Will give 1 unit PRBC and follow up post-transfusion Hgb - GI on board, appreciate their recommendations - Will start on clear liquid diet - Changed Protonix 40mg  BID IV to PO - Will check evening CBC to monitor for stability - Plan is for colonoscopy tomorrow if bleeding continues - Patient instructed to notify RN and keep stool in commode if bloody BM occurs again - Will monitor more frequent vitals  - Consulted PT/OT for eval/treatment   Prior to Admission Living Arrangement: Home  Anticipated Discharge Location: Home Barriers to Discharge: IV ABX  / endocarditis   Code Status: Full Code Diet: Heart Healthy Diet IVF: None DVT PPx: Lovenox   LOS: 5 days   Jeralyn Bennett, MD 10/24/2019, 1:24 PM

## 2019-10-25 ENCOUNTER — Inpatient Hospital Stay: Payer: Self-pay

## 2019-10-25 DIAGNOSIS — K922 Gastrointestinal hemorrhage, unspecified: Secondary | ICD-10-CM | POA: Diagnosis not present

## 2019-10-25 DIAGNOSIS — I079 Rheumatic tricuspid valve disease, unspecified: Secondary | ICD-10-CM

## 2019-10-25 DIAGNOSIS — K254 Chronic or unspecified gastric ulcer with hemorrhage: Secondary | ICD-10-CM

## 2019-10-25 DIAGNOSIS — D62 Acute posthemorrhagic anemia: Secondary | ICD-10-CM | POA: Diagnosis present

## 2019-10-25 LAB — BPAM RBC
Blood Product Expiration Date: 202109162359
Blood Product Expiration Date: 202110032359
Blood Product Expiration Date: 202110092359
Blood Product Expiration Date: 202110092359
Blood Product Expiration Date: 202110122359
ISSUE DATE / TIME: 202109101646
ISSUE DATE / TIME: 202109101905
ISSUE DATE / TIME: 202109110024
ISSUE DATE / TIME: 202109111203
ISSUE DATE / TIME: 202109122049
Unit Type and Rh: 600
Unit Type and Rh: 600
Unit Type and Rh: 600
Unit Type and Rh: 600
Unit Type and Rh: 9500

## 2019-10-25 LAB — CBC
HCT: 24.3 % — ABNORMAL LOW (ref 36.0–46.0)
Hemoglobin: 8.2 g/dL — ABNORMAL LOW (ref 12.0–15.0)
MCH: 29.6 pg (ref 26.0–34.0)
MCHC: 33.7 g/dL (ref 30.0–36.0)
MCV: 87.7 fL (ref 80.0–100.0)
Platelets: 323 10*3/uL (ref 150–400)
RBC: 2.77 MIL/uL — ABNORMAL LOW (ref 3.87–5.11)
RDW: 14.3 % (ref 11.5–15.5)
WBC: 16.1 10*3/uL — ABNORMAL HIGH (ref 4.0–10.5)
nRBC: 0.2 % (ref 0.0–0.2)

## 2019-10-25 LAB — TYPE AND SCREEN
ABO/RH(D): A NEG
Antibody Screen: NEGATIVE
Unit division: 0
Unit division: 0
Unit division: 0
Unit division: 0
Unit division: 0

## 2019-10-25 MED ORDER — MELATONIN 3 MG PO TABS
3.0000 mg | ORAL_TABLET | Freq: Every day | ORAL | Status: DC
  Administered 2019-10-25 – 2019-10-28 (×4): 3 mg via ORAL
  Filled 2019-10-25 (×4): qty 1

## 2019-10-25 MED ORDER — SODIUM CHLORIDE 0.9% FLUSH
10.0000 mL | Freq: Two times a day (BID) | INTRAVENOUS | Status: DC
  Administered 2019-10-25: 20 mL
  Administered 2019-10-26 – 2019-10-29 (×6): 10 mL

## 2019-10-25 MED ORDER — SODIUM CHLORIDE 0.9% FLUSH: 10.0000 mL | INTRAVENOUS | Status: DC | PRN

## 2019-10-25 NOTE — Anesthesia Postprocedure Evaluation (Signed)
Anesthesia Post Note  Patient: Suzanne [name]  Procedure(s) Performed: TRANSESOPHAGEAL ECHOCARDIOGRAM (TEE) (N/A )     Patient location during evaluation: Endoscopy Anesthesia Type: MAC Level of consciousness: awake and alert Pain management: pain level controlled Vital Signs Assessment: post-procedure vital signs reviewed and stable Respiratory status: spontaneous breathing, nonlabored ventilation, respiratory function stable and patient connected to nasal cannula oxygen Cardiovascular status: stable and blood pressure returned to baseline Postop Assessment: no apparent nausea or vomiting Anesthetic complications: no   No complications documented.  Last Vitals:  Vitals:   10/25/19 0822 10/25/19 0900  BP: (!) 103/59 130/82  Pulse: 81 65  Resp: 18   Temp: 36.7 C   SpO2: 100% 100%    Last Pain:  Vitals:   10/25/19 0900  TempSrc:   PainSc: 8                  Jesslyn Viglione

## 2019-10-25 NOTE — Progress Notes (Signed)
Lab unable to draw ordered labs. Konrad Penta, MD made aware.   Pt had  2nd medium black BM. Konrad Penta, MD made aware.

## 2019-10-25 NOTE — Progress Notes (Signed)
[name]  Date of Admission:  10/18/2019      Total days of antibiotics 7  Cefazolin day 7           ASSESSMENT: Suzanne [name] is a 29 y.o. 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 She also has some ongoing (albeit improved) R shoulder pain. Would still encourage her to undergo imaging of the shoulder to ensure no septic arthritis of the shoulder given heavy burden of infection at other sites.  She is afebrile and WBC downward trending. Unfortunately given her concurrent GIB she is not a candidate at this time for angiovac percutaneous valve debulking.  She is tolerating antibiotics well otherwise - will continue Cefazolin.  Hepatitis and HIV panels negative.    PLAN: 1. Continue cefazolin IV  2. Follow R shoulder MRI when she is willing to undergo this.  3. Follow blood cultures form 9/10 through to maturity 4. Would be great to get her into Suboxone clinic for opioid replacement if she is willing.    Principal Problem:   MSSA bacteremia Active Problems:   Shortness of breath   Sepsis ([employer])   Right shoulder pain   Acute septic pulmonary embolism (HCC)   Endocarditis   . sodium chloride   Intravenous Once  . diclofenac Sodium  4 g Topical QID  . escitalopram  30 mg Oral Daily  . gabapentin  400 mg Oral QHS  . oxyCODONE  10 mg Oral QID  . pantoprazole  40 mg Oral BID  . scopolamine  1 patch Transdermal Q72H    SUBJECTIVE: Doing OK - no new concerns. Pain/withdrawal better managed.  Tolerating antibiotics better.  No bloody/dark BMs today.    Review of Systems: Review of Systems  Constitutional: Negative for chills, diaphoresis and fever.  Respiratory: Negative for cough, sputum production and shortness of breath.   Cardiovascular: Positive for leg swelling. Negative for chest pain and orthopnea.  Gastrointestinal:  Negative for abdominal pain, constipation and diarrhea.  Genitourinary: Negative for dysuria.  Musculoskeletal: Positive for joint pain (R shoulder ). Negative for back pain.  Skin: Negative for rash.  Neurological: Negative for dizziness and focal weakness.    No Known Allergies  OBJECTIVE: Vitals:   10/25/19 0044 10/25/19 0300 10/25/19 0822 10/25/19 0900  BP: 108/67 132/74 (!) 103/59 130/82  Pulse: 78 77 81 65  Resp: 18 18 18    Temp: 98.9 F (37.2 C) 98.8 F (37.1 C) 98.1 F (36.7 C)   TempSrc: Oral Oral Oral   SpO2: 100% 99% 100% 100%  Weight:  55.5 kg    Height:       Body mass index is 19.76 kg/m.  Physical Exam Vitals reviewed.  Constitutional:      Comments: Resting in bed with sleep mask on. Pleasant.   Cardiovascular:     Rate and Rhythm: Normal rate and regular rhythm.     Heart sounds: Murmur heard.  Systolic murmur is present.   Pulmonary:     Effort: Pulmonary effort is normal. No tachypnea.     Breath sounds: Normal breath sounds.  Abdominal:     General: Bowel sounds are normal.     Palpations: Abdomen is soft.  Skin:    General: Skin is warm and dry.     Capillary Refill: Capillary refill takes less than 2 seconds.     Coloration: Skin is pale.  Neurological:     Mental Status: She is alert and oriented to person, place, and time.     Lab Results Lab Results  Component Value Date   WBC 16.1 (H) 10/25/2019   HGB 8.2 (L) 10/25/2019   HCT 24.3 (L) 10/25/2019   MCV 87.7 10/25/2019   PLT 323 10/25/2019    Lab Results  Component Value Date   CREATININE 0.57 10/24/2019   BUN <5 (L) 10/24/2019   NA 139 10/24/2019   K 3.7 10/24/2019   CL 107 10/24/2019   CO2 23 10/24/2019    Lab Results  Component Value Date   ALT 11 10/24/2019   AST 19 10/24/2019   ALKPHOS 53 10/24/2019   BILITOT 0.2 (L) 10/24/2019     Microbiology: Recent Results (from the past 240 hour(s))  SARS Coronavirus 2 by RT PCR (hospital order, performed in [facility]  hospital lab) Nasopharyngeal Nasopharyngeal Swab     Status: None   Collection Time: 10/19/19  7:02 AM   Specimen: Nasopharyngeal Swab  Result Value Ref Range Status   SARS Coronavirus 2 NEGATIVE NEGATIVE Final    Comment: (NOTE) SARS-CoV-2 target nucleic acids are NOT DETECTED.  The SARS-CoV-2 RNA is generally detectable in upper and lower respiratory specimens during the acute phase of infection. The lowest concentration of SARS-CoV-2 viral copies this assay can detect is 250 copies / mL. A negative result does not preclude SARS-CoV-2 infection and should not be used as the sole basis for treatment or other patient management decisions.  A negative result may occur with improper specimen collection / handling, submission of specimen other than nasopharyngeal swab, presence of viral mutation(s) within the areas targeted by this assay, and inadequate number of viral copies (<250 copies / mL). A negative result must be combined with clinical observations, patient history, and epidemiological information.  Fact Sheet for Patients:   StrictlyIdeas.no  Fact Sheet for Healthcare Providers: BankingDealers.co.za  This test is not yet approved or  cleared by the [place] FDA and has been authorized for detection and/or diagnosis of SARS-CoV-2 by FDA under an Emergency Use Authorization (EUA).  This EUA will remain in effect (meaning this test can be used) for the duration of the COVID-19 declaration under Section 564(b)(1) of the Act, 21 U.S.C. section 360bbb-3(b)(1), unless the authorization is terminated or revoked sooner.  Performed at [facility], [place] [address] [place], [place] [address]   Blood culture (routine x 2)     Status: Abnormal   Collection Time: 10/19/19  9:57 AM   Specimen: BLOOD RIGHT HAND  Result Value Ref Range Status   Specimen Description BLOOD RIGHT HAND  Final   Special Requests   Final    BOTTLES  DRAWN AEROBIC ONLY Blood Culture results may not be optimal due to an inadequate volume of blood received in culture bottles   Culture  Setup Time   Final    GRAM POSITIVE COCCI IN CLUSTERS AEROBIC BOTTLE ONLY CRITICAL RESULT CALLED TO, READ BACK BY AND VERIFIED WITH: PHARMD C AMEND 10/19/19 AT 2328 SK Performed at [facility], [place] [address], [place], [place] [address]    Culture STAPHYLOCOCCUS AUREUS (A)  Final   Report Status 10/21/2019 FINAL  Final   Organism ID, Bacteria STAPHYLOCOCCUS AUREUS  Final      Susceptibility   Staphylococcus aureus - MIC*    CIPROFLOXACIN <=0.5 SENSITIVE Sensitive     ERYTHROMYCIN <=0.25 SENSITIVE Sensitive     GENTAMICIN <=0.5  SENSITIVE Sensitive     OXACILLIN <=0.25 SENSITIVE Sensitive     TETRACYCLINE <=1 SENSITIVE Sensitive     VANCOMYCIN 1 SENSITIVE Sensitive     TRIMETH/SULFA <=10 SENSITIVE Sensitive     CLINDAMYCIN <=0.25 SENSITIVE Sensitive     RIFAMPIN <=0.5 SENSITIVE Sensitive     Inducible Clindamycin NEGATIVE Sensitive     * STAPHYLOCOCCUS AUREUS  Blood Culture ID Panel (Reflexed)     Status: Abnormal   Collection Time: 10/19/19  9:57 AM  Result Value Ref Range Status   Enterococcus faecalis NOT DETECTED NOT DETECTED Final   Enterococcus Faecium NOT DETECTED NOT DETECTED Final   Listeria monocytogenes NOT DETECTED NOT DETECTED Final   Staphylococcus species DETECTED (A) NOT DETECTED Final    Comment: CRITICAL RESULT CALLED TO, READ BACK BY AND VERIFIED WITH: PHARMD C AMEND 10/19/19 AT 2328 SK    Staphylococcus aureus (BCID) DETECTED (A) NOT DETECTED Final    Comment: CRITICAL RESULT CALLED TO, READ BACK BY AND VERIFIED WITH: PHARMD C AMEND 10/19/19 AT 2328 SK    Staphylococcus epidermidis NOT DETECTED NOT DETECTED Final   Staphylococcus lugdunensis NOT DETECTED NOT DETECTED Final   Streptococcus species NOT DETECTED NOT DETECTED Final   Streptococcus agalactiae NOT DETECTED NOT DETECTED Final   Streptococcus pneumoniae NOT  DETECTED NOT DETECTED Final   Streptococcus pyogenes NOT DETECTED NOT DETECTED Final   A.calcoaceticus-baumannii NOT DETECTED NOT DETECTED Final   Bacteroides fragilis NOT DETECTED NOT DETECTED Final   Enterobacterales NOT DETECTED NOT DETECTED Final   Enterobacter cloacae complex NOT DETECTED NOT DETECTED Final   Escherichia coli NOT DETECTED NOT DETECTED Final   Klebsiella aerogenes NOT DETECTED NOT DETECTED Final   Klebsiella oxytoca NOT DETECTED NOT DETECTED Final   Klebsiella pneumoniae NOT DETECTED NOT DETECTED Final   Proteus species NOT DETECTED NOT DETECTED Final   Salmonella species NOT DETECTED NOT DETECTED Final   Serratia marcescens NOT DETECTED NOT DETECTED Final   Haemophilus influenzae NOT DETECTED NOT DETECTED Final   Neisseria meningitidis NOT DETECTED NOT DETECTED Final   Pseudomonas aeruginosa NOT DETECTED NOT DETECTED Final   Stenotrophomonas maltophilia NOT DETECTED NOT DETECTED Final   Candida albicans NOT DETECTED NOT DETECTED Final   Candida auris NOT DETECTED NOT DETECTED Final   Candida glabrata NOT DETECTED NOT DETECTED Final   Candida krusei NOT DETECTED NOT DETECTED Final   Candida parapsilosis NOT DETECTED NOT DETECTED Final   Candida tropicalis NOT DETECTED NOT DETECTED Final   Cryptococcus neoformans/gattii NOT DETECTED NOT DETECTED Final   Meth resistant mecA/C and MREJ NOT DETECTED NOT DETECTED Final    Comment: Performed at [facility], [address], [place], [place] [address]  Blood culture (routine x 2)     Status: Abnormal   Collection Time: 10/19/19 10:07 AM   Specimen: BLOOD RIGHT ARM  Result Value Ref Range Status   Specimen Description BLOOD RIGHT ARM  Final   Special Requests   Final    BOTTLES DRAWN AEROBIC AND ANAEROBIC Blood Culture adequate volume   Culture  Setup Time   Final    GRAM POSITIVE COCCI IN CLUSTERS IN BOTH AEROBIC AND ANAEROBIC BOTTLES CRITICAL VALUE NOTED.  VALUE IS CONSISTENT WITH PREVIOUSLY REPORTED AND  CALLED VALUE.    Culture (A)  Final    STAPHYLOCOCCUS AUREUS SUSCEPTIBILITIES PERFORMED ON PREVIOUS CULTURE WITHIN THE LAST 5 DAYS. Performed at [facility], [place] [address], [place], [place] [address]    Report Status 10/21/2019 FINAL  Final  Culture, blood (routine x 2)     Status: None (Preliminary result)   Collection Time: 10/22/19 12:15 PM   Specimen: BLOOD LEFT HAND  Result Value Ref Range Status   Specimen Description BLOOD LEFT HAND  Final   Special Requests   Final    BOTTLES DRAWN AEROBIC AND ANAEROBIC Blood Culture results may not be optimal due to an inadequate volume of blood received in culture bottles   Culture   Final    NO GROWTH 2 DAYS Performed at [facility], [place] [address], [place], [place] [address]    Report Status PENDING  Incomplete  Culture, blood (routine x 2)     Status: None (Preliminary result)   Collection Time: 10/22/19  3:45 PM   Specimen: BLOOD LEFT HAND  Result Value Ref Range Status   Specimen Description BLOOD LEFT HAND  Final   Special Requests   Final    BOTTLES DRAWN AEROBIC AND ANAEROBIC Blood Culture results may not be optimal due to an inadequate volume of blood received in culture bottles   Culture   Final    NO GROWTH 2 DAYS Performed at [facility], [place] [address] [place], [place] [address]    Report Status PENDING  Incomplete  C Difficile Quick Screen (NO PCR Reflex)     Status: None   Collection Time: 10/23/19 11:11 AM   Specimen: STOOL  Result Value Ref Range Status   C Diff antigen NEGATIVE NEGATIVE Final   C Diff toxin NEGATIVE NEGATIVE Final   C Diff interpretation No C. difficile detected.  Final    Comment: Performed at [facility], [place] [address], [place],  [address]     Janene Madeira, MSN, NP-C [facility] [email or web address] Pager: [phone] Office: [phone] [place]: [phone]

## 2019-10-25 NOTE — Progress Notes (Signed)
Daily Rounding Note  10/25/2019, 1:03 PM  LOS: 6 days   SUBJECTIVE:   Chief complaint: blood loss anemia.  Gastric and duodenal ulcers.    Patient denies any abdominal pain now or in the past.  No nausea or vomiting.  Last bowel movement was dark a couple of days ago.  Hungry because she was kept n.p.o. overnight.  OBJECTIVE:         Vital signs in last 24 hours:    Temp:  [97.8 F (36.6 C)-98.9 F (37.2 C)] 98.4 F (36.9 C) (09/13 1151) Pulse Rate:  [63-87] 63 (09/13 1151) Resp:  [15-18] 18 (09/13 1151) BP: (103-132)/(59-82) 112/67 (09/13 1151) SpO2:  [99 %-100 %] 99 % (09/13 1151) Weight:  [55.5 kg] 55.5 kg (09/13 0300) Last BM Date: 10/24/19 Filed Weights   10/23/19 1405 10/24/19 0502 10/25/19 0300  Weight: 62.5 kg 62.5 kg 55.5 kg   General: Somewhat pale, thin, not acutely ill-appearing Heart: RRR. Chest: No labored breathing or cough.  Lungs clear Abdomen: Soft, nontender, nondistended.  Active bowel sounds. Extremities: No CCE. Neuro/Psych: Appropriate.  Alert.  Oriented x3.  No tremors or gross weakness/deficits.  Intake/Output from previous day: 09/12 0701 - 09/13 0700 In: 1701.5 [P.O.:720; I.V.:331.5; Blood:450; IV Piggyback:200] Out: 850 [Urine:850]  Intake/Output this shift: No intake/output data recorded.  Lab Results: Recent Labs    10/23/19 0720 10/23/19 0720 10/23/19 1623 10/24/19 1125 10/25/19 0153  WBC 37.5*  --   --  24.3* 16.1*  HGB 6.9*   < > 8.9* 6.7* 8.2*  HCT 19.7*   < > 25.2* 19.9* 24.3*  PLT 265  --   --  302 323   < > = values in this interval not displayed.   BMET Recent Labs    10/24/19 1125  NA 139  K 3.7  CL 107  CO2 23  GLUCOSE 96  BUN <5*  CREATININE 0.57  CALCIUM 7.7*   LFT Recent Labs    10/24/19 1125  PROT 4.2*  ALBUMIN 1.6*  AST 19  ALT 11  ALKPHOS 53  BILITOT 0.2*   PT/INR Recent Labs    10/23/19 0720  LABPROT 15.6*  INR 1.3*    Hepatitis Panel No results for input(s): HEPBSAG, HCVAB, HEPAIGM, HEPBIGM in the last 72 hours.  Studies/Results: No results found.  Scheduled Meds:  sodium chloride   Intravenous Once   diclofenac Sodium  4 g Topical QID   escitalopram  30 mg Oral Daily   gabapentin  400 mg Oral QHS   melatonin  3 mg Oral QHS   oxyCODONE  10 mg Oral QID   pantoprazole  40 mg Oral BID   scopolamine  1 patch Transdermal Q72H   Continuous Infusions:   ceFAZolin (ANCEF) IV 2 g (10/25/19 0933)   lactated ringers 10 mL/hr at 10/25/19 0105   PRN Meds:.acetaminophen **OR** acetaminophen, dextroamphetamine, morphine injection, ondansetron (ZOFRAN) IV   ASSESMENT:   *  Severe blood loss anemia Hgb 4.3 >> 8.2.  S/p  5 PRBCs.    *   GIB due to multiple gastric and duodenal ulcers.   EGD 9/11: Mild reflux esophagitis.  Small HH.  3 gastric ulcers, no stigmata of bleeding.  Multiple superficial duodenal bulb ulcers. Patient was taking high-dose ibuprofen for several days PTA. Surgical pathology pending to rule out H. pylori.  *   MSSA bacteremia and tricuspid endocarditis, septic emboli, septic shoulder in heroin abuser.  On  ancef.         PLAN   *   Continue Protonix 40 mg po bid for 4 weeks, then 1 x daily for 2 additional months  GI signing off, available PRN.      Suzanne [name]  10/25/2019, 1:03 PM Phone [phone]

## 2019-10-25 NOTE — Progress Notes (Signed)
     [address]       [place] [address]             [phone]       Continues to have ongoing bleeding requires blood transfusions. Will not be a candidate for the angio VAC and systemic heparinization until the bleeding source is identified and controlled.  We will follow peripherally.  [name] Bary Leriche

## 2019-10-25 NOTE — Plan of Care (Signed)
  Problem: Health Behavior/Discharge Planning: Goal: Ability to identify changes in lifestyle to reduce recurrence of condition will improve Outcome: Progressing Goal: Identification of resources available to assist in meeting health care needs will improve Outcome: Progressing

## 2019-10-25 NOTE — Progress Notes (Signed)
Subjective:  Suzanne [name] states she feels "fine" when we spoke with her this morning. Upon initial exam this morning, she denied any further diarrhea or bleeding; however, when I went to see her again this afternoon, she noted that she had 2 dark episodes of diarrhea last night and a 3rd early this afternoon. She continues to deny any nausea or abdominal pain, on scopolamine patch. No vomiting. She says her appetite has improved and would like to try eating. Her swelling remains the same. She does endorse light-headedness upon standing but no dizziness at rest, and denies SOB. She says her should pain has improved.   Objective:  Vital signs in last 24 hours: Vitals:   10/24/19 2116 10/25/19 0013 10/25/19 0044 10/25/19 0300  BP: 110/67 111/68 108/67 132/74  Pulse: 87 76 78 77  Resp: 16 18 18 18   Temp: 97.8 F (36.6 C) 97.9 F (36.6 C) 98.9 F (37.2 C) 98.8 F (37.1 C)  TempSrc: Oral Oral Oral Oral  SpO2:  99% 100% 99%  Weight:    55.5 kg  Height:       Weight change: -6.98 kg  Intake/Output Summary (Last 24 hours) at 10/25/2019 0701 Last data filed at 10/25/2019 0044 Gross per 24 hour  Intake 1650.19 ml  Output 850 ml  Net 800.19 ml   General: Patient appears ill but laying comfortably in bed in NAD. Eyes: No scleral icterus. No conjunctival injection.  Respiratory: Lungs are CTA, bilaterally. No wheezes, rales, or rhonchi.  Cardiovascular: Regular rate and rhythm. There is a 1/6 blowing systolic murmur present along the left sternal border. No rubs or gallops. Patient has diffuse, generalized, non-pitting edema in all four extremities, slightly improved since yesterday.  Abdominal: Soft, without TTP, guarding, or rebound. Bowel sounds hyperactive. Skin: Pallor present but improved since yesterday. No lesions. No rashes.  Psych: Normal range of affect. Normal tone of voice.  Assessment/Plan:  Principal Problem:   MSSA bacteremia Active Problems:   Shortness of breath    Sepsis ([place])   Right shoulder pain   Acute septic pulmonary embolism (HCC)   Endocarditis  # MSSA bacteremia with TV endocarditis, septic pulmonary emboly, and likely septic shoulder Patient presented with 5 days of CP, SOB, fever, with hx IVDU and septic emboli on CTA chest.  Blood cultures positive for MSSA. TTE performed 10/21/19 unremarkable. 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 HIV, HepBsAg and HCV Ab negative. - ID and CT surgery on board. Grateful for their recommendations. - Continue IV Ancef, Day 7 - Repeat blood cultures drawn 10/22/19 show no growth; continue to monitor - Given ongoing GI bleeding, will hold off on Angiovac procedure by CT surgery as patient will require heparinization following procedure - Will check afternoon CBC and transfuse if Hgb < 7 - consider colonoscopy if bleeding continues or worsens  - Will continue Oxy IR 10mg  q6 for pain and decrease frequency of morphine 4mg  from q3 to q6hrs PRN for breakthrough pain - continue Scopolamine patch x3 days for nausea   # GI Bleeding of Unknown Source Patient had multiple, acute episodes of bloody maroonish-brown diarrhea 10/22/19. Hemoglobin dropped to 4.3 with unstable vital signs and she was given 3 units of PRBC and LR boluses until tachycardia resolved. On 9/12, hemoglobin dropped from 8.9 to 6.7 and patient received 1 unit PRBCs. Vitals over the past 24 hours have remained stable.  C. Diff testing negative. EGD 10/23/19 showed L:A Grade A esophagitis, small hiatal hernia, three 6-7-65mm non-bleeding gastric ulcers, and many superficial duodenal ulcers in the duodenal bulb without any signs of active bleeding, likely 2/2 frequent NSAID use although bx obtained to r/o H. Pylori.  - Will check afternoon CBC - If GI Bleeding continues/worsens, patient will likely require colonoscopy to rule out active lower GI  bleeding given no definitive upper GI source - F/U biopsy to r/o H. Pylori - continue to monitor vitals / morning CBC's  # R Shoulder Pain with concern for Septic Joint: R shoulder pain improved today with improved ROM.  - Continue Ancef - Will discuss MRI again once bleeding resolves - Pain control as above   # Nausea/Vomiting: Improved with scopolamine patch. - Continue scopolamine patch - Borderline prolonged QTc    # MDD: Patient is on Lexapro at home. Patient is more open to conversation and less agitated today. - Continue home medications - Continue to monitor mood/SI - See below:   # IV Heroin Use: Patient remains pre-contemplative for substance abuse treatment.   - SW providing patient with outpatient substance abuse treatment / therapy / Psychiatry and counseling resources as well as medicaid application and will continue to follow    Prior to Admission Living Arrangement: Home  Anticipated Discharge Location: Home Barriers to Discharge: IV ABX / endocarditis   Code Status: Full Code Diet: Started on regular diet 10/25/19  IVF: None  DVT PPx: SCD's  LOS: 6 days   Jeralyn Bennett, MD 10/25/2019, 7:01 AM

## 2019-10-26 ENCOUNTER — Encounter (HOSPITAL_COMMUNITY): Payer: Self-pay | Admitting: Cardiology

## 2019-10-26 ENCOUNTER — Other Ambulatory Visit: Payer: Self-pay | Admitting: Physician Assistant

## 2019-10-26 DIAGNOSIS — K250 Acute gastric ulcer with hemorrhage: Secondary | ICD-10-CM

## 2019-10-26 LAB — CBC
HCT: 24.6 % — ABNORMAL LOW (ref 36.0–46.0)
HCT: 24.8 % — ABNORMAL LOW (ref 36.0–46.0)
Hemoglobin: 8.2 g/dL — ABNORMAL LOW (ref 12.0–15.0)
Hemoglobin: 8.2 g/dL — ABNORMAL LOW (ref 12.0–15.0)
MCH: 30 pg (ref 26.0–34.0)
MCH: 30 pg (ref 26.0–34.0)
MCHC: 33.1 g/dL (ref 30.0–36.0)
MCHC: 33.3 g/dL (ref 30.0–36.0)
MCV: 90.1 fL (ref 80.0–100.0)
MCV: 90.8 fL (ref 80.0–100.0)
Platelets: 433 10*3/uL — ABNORMAL HIGH (ref 150–400)
Platelets: 491 10*3/uL — ABNORMAL HIGH (ref 150–400)
RBC: 2.73 MIL/uL — ABNORMAL LOW (ref 3.87–5.11)
RBC: 2.73 MIL/uL — ABNORMAL LOW (ref 3.87–5.11)
RDW: 14.7 % (ref 11.5–15.5)
RDW: 15.5 % (ref 11.5–15.5)
WBC: 14.7 10*3/uL — ABNORMAL HIGH (ref 4.0–10.5)
WBC: 16.2 10*3/uL — ABNORMAL HIGH (ref 4.0–10.5)
nRBC: 0 % (ref 0.0–0.2)
nRBC: 0 % (ref 0.0–0.2)

## 2019-10-26 LAB — MAGNESIUM: Magnesium: 1.5 mg/dL — ABNORMAL LOW (ref 1.7–2.4)

## 2019-10-26 LAB — BASIC METABOLIC PANEL
Anion gap: 9 (ref 5–15)
BUN: 5 mg/dL — ABNORMAL LOW (ref 6–20)
CO2: 26 mmol/L (ref 22–32)
Calcium: 7.8 mg/dL — ABNORMAL LOW (ref 8.9–10.3)
Chloride: 106 mmol/L (ref 98–111)
Creatinine, Ser: 0.63 mg/dL (ref 0.44–1.00)
GFR calc Af Amer: >60 mL/min (ref >60)
GFR calc non Af Amer: >60 mL/min (ref >60)
Glucose, Bld: 99 mg/dL (ref 70–99)
Potassium: 3.3 mmol/L — ABNORMAL LOW (ref 3.5–5.1)
Sodium: 141 mmol/L (ref 135–145)

## 2019-10-26 LAB — SURGICAL PATHOLOGY

## 2019-10-26 LAB — TROPONIN I (HIGH SENSITIVITY)
Troponin I (High Sensitivity): 10 ng/L (ref <18)
Troponin I (High Sensitivity): 7 ng/L (ref <18)

## 2019-10-26 MED ORDER — POTASSIUM CHLORIDE CRYS ER 20 MEQ PO TBCR
40.0000 meq | EXTENDED_RELEASE_TABLET | Freq: Once | ORAL | Status: AC
  Administered 2019-10-26: 40 meq via ORAL
  Filled 2019-10-26: qty 2

## 2019-10-26 MED ORDER — LIDOCAINE 5 % EX PTCH
2.0000 | MEDICATED_PATCH | CUTANEOUS | Status: DC
  Administered 2019-10-26 – 2019-10-28 (×3): 2 via TRANSDERMAL
  Filled 2019-10-26 (×4): qty 2

## 2019-10-26 MED ORDER — MORPHINE BOLUS VIA INFUSION
4.0000 mg | Freq: Once | INTRAVENOUS | Status: DC
Start: 2019-10-26 — End: 2019-10-26

## 2019-10-26 MED ORDER — MORPHINE SULFATE (PF) 4 MG/ML IV SOLN
4.0000 mg | Freq: Once | INTRAVENOUS | Status: AC
  Administered 2019-10-26: 4 mg via INTRAVENOUS

## 2019-10-26 NOTE — Progress Notes (Signed)
Pt ambulated with RN in hall.  HR incrased to 140's while ambulating. Pt asymptomatic and tolerated ambulation very well with no distress or complaints. Post ambulated  BP 142/86, HR 94 NSR.

## 2019-10-26 NOTE — Progress Notes (Signed)
Pt c/o pain  Shoulder/chest 10/10. Internal medicine paged and verbal order from Bridgett Larsson, MD that it is okay to administer PRN morphine.

## 2019-10-26 NOTE — Progress Notes (Signed)
Spoke with pharmacist Parks Neptune concerning if pt should get 1800 dose of oxycodone since pt received 20mg  at 1412. Pharmacist stated to skip 1800 dose. Will continue to monitor.

## 2019-10-26 NOTE — Progress Notes (Addendum)
Subjective:  Patient complains of new CP with onset 30 minutes ago while at rest. She states the pain is constant, located in the center of her chest, is pleuritic in nature and worse with palpation. It does not radiate to her back. She denies SOB, lightheadedness. Her R shoulder pain is improving. She denies new rash, but does note peeling of both hands that she says worsened yesterday but has remained stable today. This is not painful and she has had a similar rash before but not as severe. She requests pain medication for her chest pain. She continues to deny abdominal pain and denies current nausea, vomiting, or additional bloody bowel movements.  Objective:  Vital signs in last 24 hours: Vitals:   10/26/19 1440 10/26/19 1500 10/26/19 1549 10/26/19 1727  BP: 128/80 131/80 130/80 (!) 139/95  Pulse: 80 80 75   Resp: 20 18 19    Temp:      TempSrc:      SpO2: 97% 96% 98%   Weight:      Height:       Weight change: 1.315 kg  Intake/Output Summary (Last 24 hours) at 10/26/2019 1816 Last data filed at 10/26/2019 1400 Gross per 24 hour  Intake 1350.04 ml  Output --  Net 1350.04 ml   General: Patient appears ill but laying comfortably in bed in NAD. Respiratory: Lungs are CTA, bilaterally. No wheezes, rales, or rhonchi.  Cardiovascular: Regular rate and rhythm. No murmurs, rubs, or gallops. Patient has improved diffuse, generalized, non-pitting edema in all four extremities.  Musculoskeletal: Patient has reproducible upper right chest wall TTP. Abdominal: Soft, without TTP, guarding, or rebound. Bowel sounds normal. Skin: Pallor present but improved. No chest wall fluctuance or erythema. There is non-erythematous, non-tender desquamation of bilateral hands surrounding the thumbs. There is 1x1" purpura surrounding previous Lovenox injection site on abdomen. No other lesions, bruising, or rashes noted. Psych: Normal range of affect. Normal tone of voice.  Assessment/Plan:  Principal  Problem:   MSSA bacteremia Active Problems:   Shortness of breath   Sepsis ([place])   Right shoulder pain   Acute septic pulmonary embolism (HCC)   Endocarditis of tricuspid valve   GI bleed   Acute blood loss anemia   Gastric ulcer with hemorrhage  # MSSA bacteremia with TV endocarditis, septic pulmonary emboly, and likely septic shoulder Patient presented with 5 days of CP, SOB, fever, with hx IVDU and septic emboli on CTA chest.  Blood cultures positive for MSSA. TTE performed 10/21/19 unremarkable. 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 HIV, HepBsAg and HCV Ab negative. - ID following. Grateful for their recommendations. - Continue IV Ancef, Day 8 - Repeat blood cultures drawn 10/22/19 show no growth; continue to monitor - Will hold off on Angiovac procedure by CT surgery given recent GIB - Will continue Oxy IR 10mg  q6 for pain and wean off of morphine  - continue Scopolamine patch x3 days for nausea   # Atypical Chest Pain Patient complains of 30 minutes of CP described as a constant pressure, sternal, pleuritic and reproducible. EKG showed no significant changes / concerns for ischemia. Troponin flat x 2.  - Could be due to PE in the setting of septic emboli - Continue to monitor - Avoid anticoagulants now given recent GIB  # GI Bleeding of Unknown Source Patient had multiple, acute episodes of bloody maroonish-brown diarrhea 10/22/19. Hemoglobin  dropped to 4.3 with unstable vital signs and she was given 3 units of PRBC and LR boluses until tachycardia resolved. On 9/12, hemoglobin dropped from 8.9 to 6.7 and patient received 1 unit PRBCs. Vitals over the past 24 hours have improved. C. Diff testing negative. EGD 10/23/19 showed L:A Grade A esophagitis, small hiatal hernia, three 6-7-39mm non-bleeding gastric ulcers, and many superficial duodenal ulcers in the duodenal bulb  without any signs of active bleeding, likely 2/2 frequent NSAID use. Bx showed reactive gastropathy, negative for H. Pylori. Hemoglobin now stable 8.2. - If GI Bleeding continues/worsens, patient will likely require colonoscopy to rule out active lower GI bleeding given no definitive upper GI source  - continue to monitor vitals / morning CBC's - Avoid NSAIDs  # R Shoulder Pain with concern for Septic Joint: R shoulder pain improved today with improved ROM.  - Continue Ancef  - ID note MRI not needed at this time, appreciate their recommendations   # Nausea/Vomiting: Improved with scopolamine patch. - Continue scopolamine patch - Borderline prolonged QTc    # MDD: Patient is on Lexapro at home. Patient is more open to conversation and less agitated today.  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 Currently working with SW. - Will try to wean off opioids in hospital and initiate Suboxone prior to discharge   Prior to Admission Living Arrangement: Home  Anticipated Discharge Location: Home Barriers to Discharge: IV ABX / endocarditis   Code Status: Full Code Diet: Started on regular diet 10/25/19  IVF: None  DVT PPx: SCD's  LOS: 7 days   Jeralyn Bennett, MD 10/26/2019, 6:16 PM

## 2019-10-26 NOTE — Progress Notes (Signed)
Pt still c/o chest pain 10/10 and worsening when she takes a deep breath. Bridgett Larsson, MD notified and stated he would call me back soon. HR 79, Oxygen saturation 100% on RA.

## 2019-10-26 NOTE — Progress Notes (Addendum)
1419: Oxycodone administered twice by two different RNs. 10mg  given at 1350 and another 10mg  given at 1412. Konrad Penta, MD made aware. Cyril Mourning, [place] and Gerald Stabs, DD also made aware. Vernie Shanks, RN and Gerald Stabs, DD contacted Pilar Plate, RPh to make him aware, discuss duplicate medication administration and signs/symptoms to monitor.  Pt's vitals stable. BP 146/104, HR 91, RR 18 A/O x 4. Will continue to monitor closely.  1440: Re-assessment: A/O x 4, BP 128/80, HR 80, RR 20.

## 2019-10-26 NOTE — Progress Notes (Signed)
[facility]  Date of Admission:  10/18/2019      Total days of antibiotics 8  Cefazolin day 8           ASSESSMENT: Bernales is improving on therapy for tricuspid valve endocarditis due to MSSA.  Her shoulder ROM is normal and there is no swelling or erythema overlying the joint. She is improving nicely on current therapy. Will defer imaging at this time given significant improvement. Will continue current therapy with IV cefazolin. She is asking about being discharged home to continue treatment of her infection. She does not use drugs routinely but her boyfriend is a driving catalyst for this. She plans to recover at her mother's home in [place]. We will consider further treatment decisions for her once she is stable from GIB perspective (had dark BMs last evening) with hemoglobin yet to be drawn today.   She has some new peeling of the skin on her hands and fingers. No rashes to feet, trunk, extremities or mucous membrane desquamation.  No itching. Advised we will continue to monitor at this time. Her hands were quite swollen upon admission and now improving.    PLAN: 1. Continue Cefazolin IV  2. Follow for clearance of blood cultures to maturity 3. Follow peeling on hands and evolution of any rash 4. Defer MRI for right shoulder     Principal Problem:   MSSA bacteremia Active Problems:   Right shoulder pain   Acute septic pulmonary embolism (HCC)   Endocarditis of tricuspid valve   Shortness of breath   Sepsis (HCC)   GI bleed   Blood loss anemia   Gastric ulcer with hemorrhage    diclofenac Sodium  4 g Topical QID   escitalopram  30 mg Oral Daily   gabapentin  400 mg Oral QHS   melatonin  3 mg Oral QHS   oxyCODONE  10 mg Oral QID   pantoprazole  40 mg Oral BID   scopolamine  1 patch Transdermal Q72H   sodium chloride flush  10-40 mL Intracatheter Q12H    SUBJECTIVE: Doing OK - no new concerns. Pain/withdrawal  better managed.  Tolerating antibiotics better.  No bloody/dark BMs today.    Review of Systems: Review of Systems  Constitutional: Negative for chills, diaphoresis and fever.  Respiratory: Negative for cough, sputum production and shortness of breath.   Cardiovascular: Positive for leg swelling. Negative for chest pain and orthopnea.  Gastrointestinal: Negative for abdominal pain, constipation and diarrhea.  Genitourinary: Negative for dysuria.  Musculoskeletal: Positive for joint pain (R shoulder ). Negative for back pain.  Skin: Negative for rash.  Neurological: Negative for dizziness and focal weakness.    No Known Allergies  OBJECTIVE: Vitals:   10/25/19 2016 10/25/19 2343 10/26/19 0537 10/26/19 0800  BP: (!) 146/92 121/79 124/82 133/82  Pulse: 74 81 87 88  Resp: 18 18 18 16   Temp: 98.4 F (36.9 C) 98.9 F (37.2 C) 98.4 F (36.9 C) 98.3 F (36.8 C)  TempSrc: Oral Oral Oral Oral  SpO2: 99% 98% 99% 98%  Weight:   56.8 kg   Height:       Body mass index is 20.22 kg/m.  Physical Exam Vitals reviewed.  Constitutional:      Comments: Resting in bed with sleep mask on. Pleasant.   Cardiovascular:     Rate and Rhythm: Normal rate and regular rhythm.     Heart sounds: Murmur heard.  Systolic murmur is present.   Pulmonary:     Effort: Pulmonary effort is normal. No tachypnea.     Breath sounds: Normal breath sounds.  Abdominal:     General: Bowel sounds are normal.     Palpations: Abdomen is soft.  Skin:    General: Skin is warm and dry.     Capillary Refill: Capillary refill takes less than 2 seconds.     Coloration: Skin is pale.  Neurological:     Mental Status: She is alert and oriented to person, place, and time.     Lab Results Lab Results  Component Value Date   WBC 16.2 (H) 10/25/2019   HGB 8.2 (L) 10/25/2019   HCT 24.6 (L) 10/25/2019   MCV 90.1 10/25/2019   PLT 433 (H) 10/25/2019    Lab Results  Component Value Date   CREATININE 0.63  10/25/2019   BUN 5 (L) 10/25/2019   NA 141 10/25/2019   K 3.3 (L) 10/25/2019   CL 106 10/25/2019   CO2 26 10/25/2019    Lab Results  Component Value Date   ALT 11 10/24/2019   AST 19 10/24/2019   ALKPHOS 53 10/24/2019   BILITOT 0.2 (L) 10/24/2019     Microbiology: Recent Results (from the past 240 hour(s))  SARS Coronavirus 2 by RT PCR (hospital order, performed in [facility] hospital lab) Nasopharyngeal Nasopharyngeal Swab     Status: None   Collection Time: 10/19/19  7:02 AM   Specimen: Nasopharyngeal Swab  Result Value Ref Range Status   SARS Coronavirus 2 NEGATIVE NEGATIVE Final    Comment: (NOTE) SARS-CoV-2 target nucleic acids are NOT DETECTED.  The SARS-CoV-2 RNA is generally detectable in upper and lower respiratory specimens during the acute phase of infection. The lowest concentration of SARS-CoV-2 viral copies this assay can detect is 250 copies / mL. A negative result does not preclude SARS-CoV-2 infection and should not be used as the sole basis for treatment or other patient management decisions.  A negative result may occur with improper specimen collection / handling, submission of specimen other than nasopharyngeal swab, presence of viral mutation(s) within the areas targeted by this assay, and inadequate number of viral copies (<250 copies / mL). A negative result must be combined with clinical observations, patient history, and epidemiological information.  Fact Sheet for Patients:   StrictlyIdeas.no  Fact Sheet for Healthcare Providers: BankingDealers.co.za  This test is not yet approved or  cleared by the [place] FDA and has been authorized for detection and/or diagnosis of SARS-CoV-2 by FDA under an Emergency Use Authorization (EUA).  This EUA will remain in effect (meaning this test can be used) for the duration of the COVID-19 declaration under Section 564(b)(1) of the Act, 21 U.S.C. section  360bbb-3(b)(1), unless the authorization is terminated or revoked sooner.  Performed at [facility], [place] [address] [place], [place] [address]   Blood culture (routine x 2)     Status: Abnormal   Collection Time: 10/19/19  9:57 AM   Specimen: BLOOD RIGHT HAND  Result Value Ref Range Status   Specimen Description BLOOD RIGHT HAND  Final   Special Requests   Final    BOTTLES DRAWN AEROBIC ONLY Blood Culture results may not be optimal due to an inadequate volume of blood received in culture bottles   Culture  Setup Time   Final    GRAM POSITIVE COCCI IN CLUSTERS AEROBIC BOTTLE ONLY CRITICAL RESULT CALLED TO, READ BACK BY AND  VERIFIED WITH: PHARMD C AMEND 10/19/19 AT 2328 SK Performed at [facility], [place] [address], [place], [place] [address]    Culture STAPHYLOCOCCUS AUREUS (A)  Final   Report Status 10/21/2019 FINAL  Final   Organism ID, Bacteria STAPHYLOCOCCUS AUREUS  Final      Susceptibility   Staphylococcus aureus - MIC*    CIPROFLOXACIN <=0.5 SENSITIVE Sensitive     ERYTHROMYCIN <=0.25 SENSITIVE Sensitive     GENTAMICIN <=0.5 SENSITIVE Sensitive     OXACILLIN <=0.25 SENSITIVE Sensitive     TETRACYCLINE <=1 SENSITIVE Sensitive     VANCOMYCIN 1 SENSITIVE Sensitive     TRIMETH/SULFA <=10 SENSITIVE Sensitive     CLINDAMYCIN <=0.25 SENSITIVE Sensitive     RIFAMPIN <=0.5 SENSITIVE Sensitive     Inducible Clindamycin NEGATIVE Sensitive     * STAPHYLOCOCCUS AUREUS  Blood Culture ID Panel (Reflexed)     Status: Abnormal   Collection Time: 10/19/19  9:57 AM  Result Value Ref Range Status   Enterococcus faecalis NOT DETECTED NOT DETECTED Final   Enterococcus Faecium NOT DETECTED NOT DETECTED Final   Listeria monocytogenes NOT DETECTED NOT DETECTED Final   Staphylococcus species DETECTED (A) NOT DETECTED Final    Comment: CRITICAL RESULT CALLED TO, READ BACK BY AND VERIFIED WITH: PHARMD C AMEND 10/19/19 AT 2328 SK    Staphylococcus aureus (BCID) DETECTED (A) NOT  DETECTED Final    Comment: CRITICAL RESULT CALLED TO, READ BACK BY AND VERIFIED WITH: PHARMD C AMEND 10/19/19 AT 2328 SK    Staphylococcus epidermidis NOT DETECTED NOT DETECTED Final   Staphylococcus lugdunensis NOT DETECTED NOT DETECTED Final   Streptococcus species NOT DETECTED NOT DETECTED Final   Streptococcus agalactiae NOT DETECTED NOT DETECTED Final   Streptococcus pneumoniae NOT DETECTED NOT DETECTED Final   Streptococcus pyogenes NOT DETECTED NOT DETECTED Final   A.calcoaceticus-baumannii NOT DETECTED NOT DETECTED Final   Bacteroides fragilis NOT DETECTED NOT DETECTED Final   Enterobacterales NOT DETECTED NOT DETECTED Final   Enterobacter cloacae complex NOT DETECTED NOT DETECTED Final   Escherichia coli NOT DETECTED NOT DETECTED Final   Klebsiella aerogenes NOT DETECTED NOT DETECTED Final   Klebsiella oxytoca NOT DETECTED NOT DETECTED Final   Klebsiella pneumoniae NOT DETECTED NOT DETECTED Final   Proteus species NOT DETECTED NOT DETECTED Final   Salmonella species NOT DETECTED NOT DETECTED Final   Serratia marcescens NOT DETECTED NOT DETECTED Final   Haemophilus influenzae NOT DETECTED NOT DETECTED Final   Neisseria meningitidis NOT DETECTED NOT DETECTED Final   Pseudomonas aeruginosa NOT DETECTED NOT DETECTED Final   Stenotrophomonas maltophilia NOT DETECTED NOT DETECTED Final   Candida albicans NOT DETECTED NOT DETECTED Final   Candida auris NOT DETECTED NOT DETECTED Final   Candida glabrata NOT DETECTED NOT DETECTED Final   Candida krusei NOT DETECTED NOT DETECTED Final   Candida parapsilosis NOT DETECTED NOT DETECTED Final   Candida tropicalis NOT DETECTED NOT DETECTED Final   Cryptococcus neoformans/gattii NOT DETECTED NOT DETECTED Final   Meth resistant mecA/C and MREJ NOT DETECTED NOT DETECTED Final    Comment: Performed at [facility], [address], [place], [place] [address]  Blood culture (routine x 2)     Status: Abnormal   Collection Time: 10/19/19  10:07 AM   Specimen: BLOOD RIGHT ARM  Result Value Ref Range Status   Specimen Description BLOOD RIGHT ARM  Final   Special Requests   Final    BOTTLES DRAWN AEROBIC AND ANAEROBIC Blood  Culture adequate volume   Culture  Setup Time   Final    GRAM POSITIVE COCCI IN CLUSTERS IN BOTH AEROBIC AND ANAEROBIC BOTTLES CRITICAL VALUE NOTED.  VALUE IS CONSISTENT WITH PREVIOUSLY REPORTED AND CALLED VALUE.    Culture (A)  Final    STAPHYLOCOCCUS AUREUS SUSCEPTIBILITIES PERFORMED ON PREVIOUS CULTURE WITHIN THE LAST 5 DAYS. Performed at [facility], [place] [address], [place], [place] [address]    Report Status 10/21/2019 FINAL  Final  Culture, blood (routine x 2)     Status: None (Preliminary result)   Collection Time: 10/22/19 12:15 PM   Specimen: BLOOD LEFT HAND  Result Value Ref Range Status   Specimen Description BLOOD LEFT HAND  Final   Special Requests   Final    BOTTLES DRAWN AEROBIC AND ANAEROBIC Blood Culture results may not be optimal due to an inadequate volume of blood received in culture bottles   Culture   Final    NO GROWTH 4 DAYS Performed at [facility], [place] [address], [place], [place] [address]    Report Status PENDING  Incomplete  Culture, blood (routine x 2)     Status: None (Preliminary result)   Collection Time: 10/22/19  3:45 PM   Specimen: BLOOD LEFT HAND  Result Value Ref Range Status   Specimen Description BLOOD LEFT HAND  Final   Special Requests   Final    BOTTLES DRAWN AEROBIC AND ANAEROBIC Blood Culture results may not be optimal due to an inadequate volume of blood received in culture bottles   Culture   Final    NO GROWTH 4 DAYS Performed at [facility], [place] [address], [place], [place] [address]    Report Status PENDING  Incomplete  C Difficile Quick Screen (NO PCR Reflex)     Status: None   Collection Time: 10/23/19 11:11 AM   Specimen: STOOL  Result Value Ref Range Status   C Diff antigen NEGATIVE NEGATIVE Final   C Diff toxin  NEGATIVE NEGATIVE Final   C Diff interpretation No C. difficile detected.  Final    Comment: Performed at [facility], [place] [address], [place], [place] [address]     Janene Madeira, MSN, NP-C [facility] [email or web address] Pager: [phone] Office: [place]: [phone]

## 2019-10-27 LAB — COMPREHENSIVE METABOLIC PANEL
ALT: 20 U/L (ref 0–44)
AST: 30 U/L (ref 15–41)
Albumin: 2 g/dL — ABNORMAL LOW (ref 3.5–5.0)
Alkaline Phosphatase: 64 U/L (ref 38–126)
Anion gap: 9 (ref 5–15)
BUN: <5 mg/dL — ABNORMAL LOW (ref 6–20)
CO2: 28 mmol/L (ref 22–32)
Calcium: 8.2 mg/dL — ABNORMAL LOW (ref 8.9–10.3)
Chloride: 101 mmol/L (ref 98–111)
Creatinine, Ser: 0.54 mg/dL (ref 0.44–1.00)
GFR calc Af Amer: >60 mL/min (ref >60)
GFR calc non Af Amer: >60 mL/min (ref >60)
Glucose, Bld: 125 mg/dL — ABNORMAL HIGH (ref 70–99)
Potassium: 4.3 mmol/L (ref 3.5–5.1)
Sodium: 138 mmol/L (ref 135–145)
Total Bilirubin: 0.1 mg/dL — ABNORMAL LOW (ref 0.3–1.2)
Total Protein: 5.6 g/dL — ABNORMAL LOW (ref 6.5–8.1)

## 2019-10-27 LAB — CULTURE, BLOOD (ROUTINE X 2)
Culture: NO GROWTH
Culture: NO GROWTH

## 2019-10-27 LAB — CBC
HCT: 26.5 % — ABNORMAL LOW (ref 36.0–46.0)
Hemoglobin: 8.6 g/dL — ABNORMAL LOW (ref 12.0–15.0)
MCH: 29.1 pg (ref 26.0–34.0)
MCHC: 32.5 g/dL (ref 30.0–36.0)
MCV: 89.5 fL (ref 80.0–100.0)
Platelets: 623 10*3/uL — ABNORMAL HIGH (ref 150–400)
RBC: 2.96 MIL/uL — ABNORMAL LOW (ref 3.87–5.11)
RDW: 14.8 % (ref 11.5–15.5)
WBC: 13.5 10*3/uL — ABNORMAL HIGH (ref 4.0–10.5)
nRBC: 0 % (ref 0.0–0.2)

## 2019-10-27 MED ORDER — MORPHINE BOLUS VIA INFUSION: 2.0000 mg | Freq: Once | INTRAVENOUS | Status: DC

## 2019-10-27 MED ORDER — MAGNESIUM SULFATE 50 % IJ SOLN
3.0000 g | Freq: Once | INTRAVENOUS | Status: AC
  Administered 2019-10-27: 3 g via INTRAVENOUS
  Filled 2019-10-27: qty 6

## 2019-10-27 MED ORDER — MORPHINE SULFATE (PF) 2 MG/ML IV SOLN: 2.0000 mg | INTRAVENOUS | Status: DC | PRN

## 2019-10-27 MED ORDER — OXYCODONE HCL 5 MG PO TABS
10.0000 mg | ORAL_TABLET | Freq: Four times a day (QID) | ORAL | Status: DC
  Administered 2019-10-27 – 2019-10-29 (×9): 10 mg via ORAL
  Filled 2019-10-27 (×9): qty 2

## 2019-10-27 MED ORDER — OXYCODONE HCL 5 MG PO TABS: 10.0000 mg | ORAL_TABLET | Freq: Four times a day (QID) | ORAL | Status: DC

## 2019-10-27 MED ORDER — MORPHINE SULFATE (PF) 2 MG/ML IV SOLN
2.0000 mg | Freq: Once | INTRAVENOUS | Status: AC
  Administered 2019-10-27: 2 mg via INTRAVENOUS
  Filled 2019-10-27: qty 1

## 2019-10-27 MED ORDER — OXYCODONE HCL 5 MG PO TABS: 5.0000 mg | ORAL_TABLET | Freq: Four times a day (QID) | ORAL | Status: DC

## 2019-10-27 MED ORDER — OXYCODONE HCL 5 MG PO TABS
5.0000 mg | ORAL_TABLET | ORAL | Status: DC | PRN
  Administered 2019-10-27 – 2019-10-29 (×8): 5 mg via ORAL
  Filled 2019-10-27 (×8): qty 1

## 2019-10-27 NOTE — Progress Notes (Signed)
[facility]  Date of Admission:  10/18/2019           Day 9 cefazolin ASSESSMENT: She is improving on therapy for MSSA bacteremia complicated by tricuspid valve endocarditis and septic pulmonary emboli.  She has a very large bilobed vegetation.  Once it is clear that she is not having any further GI bleeding I would check with Dr. Kipp Brood possible angio [facility] of the tricuspid valve.  This could allow [place] to shorten her antibiotic therapy as well as reducing the risk of further septic emboli.  PLAN: 1. Continue cefazolin  Principal Problem:   MSSA bacteremia Active Problems:   Acute septic pulmonary embolism (HCC)   Endocarditis of tricuspid valve   Shortness of breath   Sepsis ([place])   Right shoulder pain   GI bleed   Acute blood loss anemia   Gastric ulcer with hemorrhage   Scheduled Meds: . diclofenac Sodium  4 g Topical QID  . escitalopram  30 mg Oral Daily  . gabapentin  400 mg Oral QHS  . lidocaine  2 patch Transdermal Q24H  . melatonin  3 mg Oral QHS  . oxyCODONE  10 mg Oral Q6H  . pantoprazole  40 mg Oral BID  . scopolamine  1 patch Transdermal Q72H  . sodium chloride flush  10-40 mL Intracatheter Q12H   Continuous Infusions: .  ceFAZolin (ANCEF) IV 2 g (10/27/19 0546)  . lactated ringers 10 mL/hr at 10/25/19 0105   PRN Meds:.acetaminophen **OR** acetaminophen, dextroamphetamine, ondansetron (ZOFRAN) IV, oxyCODONE, sodium chloride flush   SUBJECTIVE: Overall she is feeling better but she is still having some pleuritic chest pain.  She says that she has not had any obvious blood in her stool in the past 36 hours.  She wants to go home.  Review of Systems: Review of Systems  Constitutional: Negative for fever.  Respiratory: Negative for cough, sputum production and shortness of breath.   Cardiovascular: Positive for chest pain.    No Known Allergies  OBJECTIVE: Vitals:   10/26/19 1727 10/26/19 1951 10/27/19 0106 10/27/19  0500  BP: (!) 139/95 (!) 154/101 (!) 132/92 126/89  Pulse:  79 95 81  Resp:  17 19 18   Temp:  98.3 F (36.8 C) 98.3 F (36.8 C) 98.5 F (36.9 C)  TempSrc:  Oral Oral Oral  SpO2:  100% 96% 97%  Weight:    54.7 kg  Height:       Body mass index is 19.45 kg/m.  Physical Exam Constitutional:      Comments: She is resting quietly in bed with her boyfriend.  Cardiovascular:     Rate and Rhythm: Normal rate and regular rhythm.     Heart sounds: No murmur heard.   Pulmonary:     Effort: Pulmonary effort is normal.     Breath sounds: Normal breath sounds.  Psychiatric:        Mood and Affect: Mood normal.     Lab Results Lab Results  Component Value Date   WBC 13.5 (H) 10/27/2019   HGB 8.6 (L) 10/27/2019   HCT 26.5 (L) 10/27/2019   MCV 89.5 10/27/2019   PLT 623 (H) 10/27/2019    Lab Results  Component Value Date   CREATININE 0.63 10/25/2019   BUN 5 (L) 10/25/2019   NA 141 10/25/2019   K 3.3 (L) 10/25/2019   CL 106 10/25/2019   CO2 26 10/25/2019    Lab Results  Component Value Date   ALT 11 10/24/2019   AST 19 10/24/2019   ALKPHOS 53 10/24/2019   BILITOT 0.2 (L) 10/24/2019     Microbiology: Recent Results (from the past 240 hour(s))  SARS Coronavirus 2 by RT PCR (hospital order, performed in [facility] hospital lab) Nasopharyngeal Nasopharyngeal Swab     Status: None   Collection Time: 10/19/19  7:02 AM   Specimen: Nasopharyngeal Swab  Result Value Ref Range Status   SARS Coronavirus 2 NEGATIVE NEGATIVE Final    Comment: (NOTE) SARS-CoV-2 target nucleic acids are NOT DETECTED.  The SARS-CoV-2 RNA is generally detectable in upper and lower respiratory specimens during the acute phase of infection. The lowest concentration of SARS-CoV-2 viral copies this assay can detect is 250 copies / mL. A negative result does not preclude SARS-CoV-2 infection and should not be used as the sole basis for treatment or other patient management decisions.  A negative  result may occur with improper specimen collection / handling, submission of specimen other than nasopharyngeal swab, presence of viral mutation(s) within the areas targeted by this assay, and inadequate number of viral copies (<250 copies / mL). A negative result must be combined with clinical observations, patient history, and epidemiological information.  Fact Sheet for Patients:   StrictlyIdeas.no  Fact Sheet for Healthcare Providers: BankingDealers.co.za  This test is not yet approved or  cleared by the [place] FDA and has been authorized for detection and/or diagnosis of SARS-CoV-2 by FDA under an Emergency Use Authorization (EUA).  This EUA will remain in effect (meaning this test can be used) for the duration of the COVID-19 declaration under Section 564(b)(1) of the Act, 21 U.S.C. section 360bbb-3(b)(1), unless the authorization is terminated or revoked sooner.  Performed at [facility], [place] [address], [place], [place] [address]   Blood culture (routine x 2)     Status: Abnormal   Collection Time: 10/19/19  9:57 AM   Specimen: BLOOD RIGHT HAND  Result Value Ref Range Status   Specimen Description BLOOD RIGHT HAND  Final   Special Requests   Final    BOTTLES DRAWN AEROBIC ONLY Blood Culture results may not be optimal due to an inadequate volume of blood received in culture bottles   Culture  Setup Time   Final    GRAM POSITIVE COCCI IN CLUSTERS AEROBIC BOTTLE ONLY CRITICAL RESULT CALLED TO, READ BACK BY AND VERIFIED WITH: PHARMD C AMEND 10/19/19 AT 2328 SK Performed at [facility], [place] [address] [place], [place] [address]    Culture STAPHYLOCOCCUS AUREUS (A)  Final   Report Status 10/21/2019 FINAL  Final   Organism ID, Bacteria STAPHYLOCOCCUS AUREUS  Final      Susceptibility   Staphylococcus aureus - MIC*    CIPROFLOXACIN <=0.5 SENSITIVE Sensitive     ERYTHROMYCIN <=0.25 SENSITIVE Sensitive      GENTAMICIN <=0.5 SENSITIVE Sensitive     OXACILLIN <=0.25 SENSITIVE Sensitive     TETRACYCLINE <=1 SENSITIVE Sensitive     VANCOMYCIN 1 SENSITIVE Sensitive     TRIMETH/SULFA <=10 SENSITIVE Sensitive     CLINDAMYCIN <=0.25 SENSITIVE Sensitive     RIFAMPIN <=0.5 SENSITIVE Sensitive     Inducible Clindamycin NEGATIVE Sensitive     * STAPHYLOCOCCUS AUREUS  Blood Culture ID Panel (Reflexed)     Status: Abnormal   Collection Time: 10/19/19  9:57 AM  Result Value Ref Range Status   Enterococcus faecalis NOT DETECTED NOT DETECTED Final   Enterococcus  Faecium NOT DETECTED NOT DETECTED Final   Listeria monocytogenes NOT DETECTED NOT DETECTED Final   Staphylococcus species DETECTED (A) NOT DETECTED Final    Comment: CRITICAL RESULT CALLED TO, READ BACK BY AND VERIFIED WITH: PHARMD C AMEND 10/19/19 AT 2328 SK    Staphylococcus aureus (BCID) DETECTED (A) NOT DETECTED Final    Comment: CRITICAL RESULT CALLED TO, READ BACK BY AND VERIFIED WITH: PHARMD C AMEND 10/19/19 AT 2328 SK    Staphylococcus epidermidis NOT DETECTED NOT DETECTED Final   Staphylococcus lugdunensis NOT DETECTED NOT DETECTED Final   Streptococcus species NOT DETECTED NOT DETECTED Final   Streptococcus agalactiae NOT DETECTED NOT DETECTED Final   Streptococcus pneumoniae NOT DETECTED NOT DETECTED Final   Streptococcus pyogenes NOT DETECTED NOT DETECTED Final   A.calcoaceticus-baumannii NOT DETECTED NOT DETECTED Final   Bacteroides fragilis NOT DETECTED NOT DETECTED Final   Enterobacterales NOT DETECTED NOT DETECTED Final   Enterobacter cloacae complex NOT DETECTED NOT DETECTED Final   Escherichia coli NOT DETECTED NOT DETECTED Final   Klebsiella aerogenes NOT DETECTED NOT DETECTED Final   Klebsiella oxytoca NOT DETECTED NOT DETECTED Final   Klebsiella pneumoniae NOT DETECTED NOT DETECTED Final   Proteus species NOT DETECTED NOT DETECTED Final   Salmonella species NOT DETECTED NOT DETECTED Final   Serratia marcescens NOT  DETECTED NOT DETECTED Final   Haemophilus influenzae NOT DETECTED NOT DETECTED Final   Neisseria meningitidis NOT DETECTED NOT DETECTED Final   Pseudomonas aeruginosa NOT DETECTED NOT DETECTED Final   Stenotrophomonas maltophilia NOT DETECTED NOT DETECTED Final   Candida albicans NOT DETECTED NOT DETECTED Final   Candida auris NOT DETECTED NOT DETECTED Final   Candida glabrata NOT DETECTED NOT DETECTED Final   Candida krusei NOT DETECTED NOT DETECTED Final   Candida parapsilosis NOT DETECTED NOT DETECTED Final   Candida tropicalis NOT DETECTED NOT DETECTED Final   Cryptococcus neoformans/gattii NOT DETECTED NOT DETECTED Final   Meth resistant mecA/C and MREJ NOT DETECTED NOT DETECTED Final    Comment: Performed at [facility], [address] [place], [place] [address]  Blood culture (routine x 2)     Status: Abnormal   Collection Time: 10/19/19 10:07 AM   Specimen: BLOOD RIGHT ARM  Result Value Ref Range Status   Specimen Description BLOOD RIGHT ARM  Final   Special Requests   Final    BOTTLES DRAWN AEROBIC AND ANAEROBIC Blood Culture adequate volume   Culture  Setup Time   Final    GRAM POSITIVE COCCI IN CLUSTERS IN BOTH AEROBIC AND ANAEROBIC BOTTLES CRITICAL VALUE NOTED.  VALUE IS CONSISTENT WITH PREVIOUSLY REPORTED AND CALLED VALUE.    Culture (A)  Final    STAPHYLOCOCCUS AUREUS SUSCEPTIBILITIES PERFORMED ON PREVIOUS CULTURE WITHIN THE LAST 5 DAYS. Performed at [facility], [place] [address] [place], [place] [address]    Report Status 10/21/2019 FINAL  Final  Culture, blood (routine x 2)     Status: None (Preliminary result)   Collection Time: 10/22/19 12:15 PM   Specimen: BLOOD LEFT HAND  Result Value Ref Range Status   Specimen Description BLOOD LEFT HAND  Final   Special Requests   Final    BOTTLES DRAWN AEROBIC AND ANAEROBIC Blood Culture results may not be optimal due to an inadequate volume of blood received in culture bottles   Culture   Final    NO  GROWTH 4 DAYS Performed at [facility], [place] [address] [place],  [address]  Report Status PENDING  Incomplete  Culture, blood (routine x 2)     Status: None (Preliminary result)   Collection Time: 10/22/19  3:45 PM   Specimen: BLOOD LEFT HAND  Result Value Ref Range Status   Specimen Description BLOOD LEFT HAND  Final   Special Requests   Final    BOTTLES DRAWN AEROBIC AND ANAEROBIC Blood Culture results may not be optimal due to an inadequate volume of blood received in culture bottles   Culture   Final    NO GROWTH 4 DAYS Performed at [facility], [place] [address], [place], [place] [address]    Report Status PENDING  Incomplete  C Difficile Quick Screen (NO PCR Reflex)     Status: None   Collection Time: 10/23/19 11:11 AM   Specimen: STOOL  Result Value Ref Range Status   C Diff antigen NEGATIVE NEGATIVE Final   C Diff toxin NEGATIVE NEGATIVE Final   C Diff interpretation No C. difficile detected.  Final    Comment: Performed at [facility], [place] [address] [place], [place] [address]    Michel Bickers, [facility] [place] [facility] [phone] pager   [phone] cell 10/27/2019, 12:46 PM

## 2019-10-27 NOTE — Plan of Care (Signed)
  Problem: Clinical Measurements: Goal: Respiratory complications will improve Outcome: Progressing   

## 2019-10-27 NOTE — Progress Notes (Signed)
Subjective:  Patient complaining of increased pleuritic, tender chest pain worse on the right side, stating that her pain medications are not frequent enough. Discussed that she had a severe GI bleed earlier, so we have to avoid NSAIDs and that she will not be a candidate for CT surgery at this time since that would required anticoagulation afterwards. She discussed with ID a potential plan of oritavancin and completing the rest of her course at home, and she is very eager to go through with this plan.   We discussed suboxone at length with her and its potential benefits. After a long discussion, she states that she would be interested in trying this medication.  She denies worsening peeling of her hands (aside from her picking at them) and continues to deny any GI bleeding or diarrhea. Denies nausea, vomiting, fevers, or chills. Says that her swelling has improved.   Objective:  Vital signs in last 24 hours: Vitals:   10/26/19 1727 10/26/19 1951 10/27/19 0106 10/27/19 0500  BP: (!) 139/95 (!) 154/101 (!) 132/92 126/89  Pulse:  79 95 81  Resp:  17 19 18   Temp:  98.3 F (36.8 C) 98.3 F (36.8 C) 98.5 F (36.9 C)  TempSrc:  Oral Oral Oral  SpO2:  100% 96% 97%  Weight:    54.7 kg  Height:       Weight change: -2.177 kg  Intake/Output Summary (Last 24 hours) at 10/27/2019 1822 Last data filed at 10/26/2019 2200 Gross per 24 hour  Intake --  Output 820 ml  Net -820 ml   General: Patient is laying comfortably in bed in NAD. Respiratory: Lungs are CTA, bilaterally. No wheezes, rales, or rhonchi.  Cardiovascular: Regular rate and rhythm. No murmurs, rubs, or gallops. Patient has minimal, diffuse non-pitting edema of all four extremities. Musculoskeletal: Patient has reproducible upper right chest wall TTP with swelling overlying her sternal area. Abdominal: Soft, without TTP, guarding, or rebound. Bowel sounds normal. Skin: Pallor present but improved. No chest wall erythema. There  is non-erythematous, non-tender desquamation of bilateral hands surrounding the thumbs. No other lesions or rashes noted. Psych: Normal range of affect. Normal tone of voice.  Assessment/Plan:  Principal Problem:   MSSA bacteremia Active Problems:   Shortness of breath   Sepsis ([place])   Right shoulder pain   Acute septic pulmonary embolism (HCC)   Endocarditis of tricuspid valve   GI bleed   Acute blood loss anemia   Gastric ulcer with hemorrhage  # MSSA bacteremia with TV endocarditis, septic pulmonary emboly, and possible septic R shoulder and R chest wall infection Patient presented with 5 days of CP, SOB, fever, with hx IVDU and septic emboli on CTA chest.  Blood cultures positive for MSSA. TTE performed 10/21/19 unremarkable. 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 HIV, HepBsAg and HCV Ab negative. - ID following. Grateful for their recommendations. - Continue IV Ancef, Day 9 - Repeat blood cultures drawn 10/22/19 show no growth; continue to monitor - ID recommend contacting Dr. Kipp Brood with CT surgery for possible angio Vac of TV; however, will wait until GIB proven stable  - Discussed right chest wall findings with radiology; will consider getting chest CT with contrast for further evaluation to r/o osteo vs. abscess - Decreased frequency of oxycodone 10mg  to every 6 hours scheduled, with oxy 5mg  q3hours PRN for breakthrough pain - Gave single dose  of morphine 2mg  earlier today, but will discontinue morphine moving forward if patient able to tolerate this given she is interested in pursuing suboxone prior to discharge if possible  - Will continue to wean opioids  - continue Scopolamine patch x3 days for nausea   # GI Bleeding of Unknown Source Patient had multiple, acute episodes of bloody maroonish-brown diarrhea 10/22/19. Hemoglobin dropped to 4.3 with unstable vital  signs and she was given 3 units of PRBC and LR boluses until tachycardia resolved. On 9/12, hemoglobin dropped from 8.9 to 6.7 and patient received 1 unit PRBCs. Vitals over the past 24 hours have improved. C. Diff testing negative. EGD 10/23/19 showed L:A Grade A esophagitis, small hiatal hernia, three 6-7-12mm non-bleeding gastric ulcers, and many superficial duodenal ulcers in the duodenal bulb without any signs of active bleeding, likely 2/2 frequent NSAID use. Bx showed reactive gastropathy, negative for H. Pylori. Hemoglobin now stable 8.2. - Do not anticipate this, but if GI Bleeding continues/worsens, patient will likely require colonoscopy to rule out active lower GI bleeding given no proven upper GI source  - continue to monitor vitals / morning CBC's - Avoid NSAIDs  # R Shoulder Pain with concern for Septic Joint: R shoulder pain improved today with improved ROM.  - Continue Ancef  - ID note MRI not needed at this time, appreciate their recommendations   # Nausea/Vomiting: Improved with scopolamine patch. Borderline prolonged QTc. - Continue scopolamine patch   # MDD: Patient is on Lexapro at home. Patient is more open to conversation and less agitated today.  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 - Will try to wean off opioids in hospital and initiate Suboxone prior to discharge   Prior to Admission Living Arrangement: Home  Anticipated Discharge Location: Home Barriers to Discharge: IV ABX / endocarditis   Code Status: Full Code Diet: Started on regular diet 10/25/19  IVF: None  DVT PPx: SCD's  LOS: 8 days   Jeralyn Bennett, MD 10/27/2019, 6:22 PM

## 2019-10-28 ENCOUNTER — Inpatient Hospital Stay (HOSPITAL_COMMUNITY): Payer: Self-pay

## 2019-10-28 DIAGNOSIS — I339 Acute and subacute endocarditis, unspecified: Secondary | ICD-10-CM

## 2019-10-28 DIAGNOSIS — I360 Nonrheumatic tricuspid (valve) stenosis: Secondary | ICD-10-CM

## 2019-10-28 NOTE — Progress Notes (Signed)
     [address]       [place] [address]             [phone]       I reviewed the patient's images with clinical specialist from [place].  Given the location of the vegetation in the ventricle under the tricuspid leaflet we all agree that angio VAC debridement would not be a good option for this patient.  There is very low likelihood of success, and given her recent GI bleed systemic heparinization would present too high of a risk with very little benefit.  Fortunately she does not have much tricuspid valve disease this would recommend continued antibiotic therapy.  We can reassess for the presence of this vegetation upon completion of her IV antibiotic therapy course.  Mata Rowen Bary Leriche

## 2019-10-28 NOTE — Progress Notes (Addendum)
Subjective:  Patient states that she is in slightly less pain than yesterday. She continues to note swelling of her right chest wall that is painful to the touch, but notes that her pain in her chest is more diffuse, sharp, and worse with breathing. She continues to deny any bloody diarrhea, abdominal pain, nausea or vomiting. She says her diffuse swelling has resolved and that her hands haven't peeled further. She notes that she would like to be weaned from her pain medications but is not interested in starting suboxone today and notes she'll continue to think on it. She denies any fevers, chills, palpitations, or other symptoms and is eager to return home.  Objective:  Vital signs in last 24 hours: Vitals:   10/27/19 1700 10/27/19 2127 10/28/19 0624 10/28/19 0800  BP: 126/87 (!) 131/94 126/77 124/84  Pulse: 95 97 92 94  Resp: 20 19 17 17   Temp: 98.4 F (36.9 C) 98.2 F (36.8 C) 97.8 F (36.6 C) 98.3 F (36.8 C)  TempSrc: Oral Oral Oral Oral  SpO2: 97% 100% 100%   Weight:   53.3 kg   Height:       Weight change: -1.361 kg  Intake/Output Summary (Last 24 hours) at 10/28/2019 1430 Last data filed at 10/28/2019 0654 Gross per 24 hour  Intake 100 ml  Output --  Net 100 ml   General: Patient is laying comfortably in bed in NAD. Respiratory: Lungs are CTA, bilaterally. No wheezes, rales, or rhonchi.  Cardiovascular: Patient is slightly tachycardic with regular rhythm. 1/6 systolic blowing murmur heard over the left lower sternal border. No other murmurs, rubs, or gallops noted. Diffuse, non-pitting edema has resolved.  Musculoskeletal: Patient has mild, reproducible upper right chest wall TTP with swelling overlying her sternal area. Abdominal: Soft, without TTP, guarding, or rebound. Bowel sounds normal. Skin: Pallor present but improved. No chest wall erythema. There is non-erythematous, non-tender desquamation of bilateral hands is stable and not erythematous. No other lesions or  rashes noted. Psych: Normal range of affect. Normal tone of voice.  Assessment/Plan:  Principal Problem:   MSSA bacteremia Active Problems:   Shortness of breath   Sepsis ([place])   Right shoulder pain   Acute septic pulmonary embolism (HCC)   Endocarditis of tricuspid valve   GI bleed   Acute blood loss anemia   Gastric ulcer with hemorrhage  # MSSA bacteremia with TV endocarditis, septic pulmonary emboly, and possible septic R shoulder and R chest wall infection Patient presented with 5 days of CP, SOB, fever, with hx IVDU and septic emboli on CTA chest.  Blood cultures positive for MSSA. TTE performed 10/21/19 unremarkable. 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 Soft tissue ultrasound of the chest today showed a lenticular hypoechoic mass within the muscles anterior to the sternum (19x25x57mm) with surrounding hypervascularity, most consistent with a solid lesion such as fibroma, less likely hematoma or infection, although no drainable fluid collection was present. HIV, HepBsAg and HCV Ab negative. Repeat blood cultures drawn 10/22/19 resulted in no growth. - ID following. Grateful for their recommendations. - Will continue IV Ancef for now (Day 10) - Will consider outpatient completion of therapy with long-acting IV oritavancin +/- linezolid given patient desire to return home - Patient not a candidate for angiovac procedure of TV due to positioning of vegetation and need for heparinization following procedure, per Dr. Kipp Brood / CT surgery  -  Will discuss with patient and attempt to wean pain medications prior to discharge - Hold scopolamine patch as no longer needed   # Resolved, Suspected Upper GI Bleeding 2/2 NSAID Use Patient has had several maroonish-brown, bloody diarrheal episodes this admission, likely due to gastric vs. Duodenal ulcers visualized on EGD 2/2 frequent  NSAID use PTA. Patient has not had any more blood in her stools in the last couple of days. Hgb very stable now at 8.6.  - CT will not perform angiovac procedure - GI on standby; do not anticipate this, but if GIB were to return/worsen, patient may require colonoscopy to rule out active lower GI bleeding given no definitive upper GI source  - continue to monitor vitals / morning CBC - Avoid NSAIDs  # R Shoulder Pain with concern for Septic Joint: R shoulder pain improved today with improved ROM.  - Continue Ancef  - ID note MRI not needed at this time, appreciate their recommendations   # Nausea/Vomiting, Resolved Borderline prolonged QTc. - Scopolamine patch no longer required   # MDD: Patient is on Lexapro at home. Patient is more open to conversation and less agitated today.  - Continue home medications - Continue to monitor mood/SI - SW providing outpatient therapy/counseling/psychiatry resources and medicaid application   # IV Heroin Use: Patient previously noted that she was interested in substance abuse treatment; however, today states that she remains contemplative about starting Suboxone. - Will discuss the possibility of continuing to wean off opioids in hospital and initiate Suboxone prior to discharge if patient is open to this possibility.  Prior to Admission Living Arrangement: Home  Anticipated Discharge Location: Home Barriers to Discharge: Pain Control Anticipated Discharge: ~3 days if patient prefers to wean off of opioids in hospital; otherwise, stable for discharge with oral ABX tomorrow.   Code Status: Full Code Diet: Started on regular diet 10/25/19  IVF: None  DVT PPx: SCD's  LOS: 9 days   Jeralyn Bennett, MD 10/28/2019, 2:30 PM

## 2019-10-28 NOTE — Progress Notes (Signed)
Report given to [facility], Therapist, sports. Patient is alert and oriented x4 resting in bed with call light in reach. KVO fluids infusing. Midline WNL. IV antibiotics in place for 4 weeks. Scheduled pain medication given. Labs pending.

## 2019-10-28 NOTE — Progress Notes (Addendum)
         [facility]  Date of Admission:  10/18/2019      Total days of antibiotics 10  Cefazolin day 9           ASSESSMENT: Plumb was leaving for ultrasound exam of the chest at the time of our visit. Exam reveals no drainable fluid collection in or around the examined lung. She has had some ongoing complaints of chest pain likely related to septic pulmonary emboli. Ischemic cardiac work up negative.   Reviewed with Dr. Kipp Brood - risks of possible Angiovac procedure are not worth risks given difficult to access lesion and recent GIB.  She will need a follow up echo at the end of therapy to re-evaluate TV function.   She is quite anxious to get home. Will consider alternative option for treatment with either linezolid PO vs dalbavancin to complete full course of 6 weeks of treatment (given the size of vegetation). She has no left sided endocarditis and no other metastatic sites of infection related to her bacteremia.     PLAN: 1. Continue Cefazolin IV 2. Further recs to come after d/w Dr. Megan Salon   3. HOLD Lexapro while on Linezolid  4. Will arrange for Linezolid to be ready for discharge home tomorrow .   ADDENDUM: 4:30 PM  Will plan to convert to oral linezolid therapy to complete 4 additional weeks of treatment for her. We will need to hold her lexapro for this time frame.  I will make her a virtual follow up in 2 weeks to check on how she is doing and see her back in the office in 4-5 weeks after she completes therapy.     Principal Problem:   MSSA bacteremia Active Problems:   Right shoulder pain   Acute septic pulmonary embolism (HCC)   Endocarditis of tricuspid valve   Shortness of breath   Sepsis ([place])   GI bleed   Acute blood loss anemia   Gastric ulcer with hemorrhage    Janene Madeira, MSN, NP-C [email or web address] Pager: [phone] Office:  [phone] [place]: [phone]

## 2019-10-29 ENCOUNTER — Inpatient Hospital Stay: Payer: Self-pay

## 2019-10-29 DIAGNOSIS — I339 Acute and subacute endocarditis, unspecified: Secondary | ICD-10-CM

## 2019-10-29 DIAGNOSIS — Z23 Encounter for immunization: Secondary | ICD-10-CM

## 2019-10-29 DIAGNOSIS — D500 Iron deficiency anemia secondary to blood loss (chronic): Secondary | ICD-10-CM

## 2019-10-29 LAB — COMPREHENSIVE METABOLIC PANEL
ALT: 22 U/L (ref 0–44)
AST: 27 U/L (ref 15–41)
Albumin: 2.5 g/dL — ABNORMAL LOW (ref 3.5–5.0)
Alkaline Phosphatase: 78 U/L (ref 38–126)
Anion gap: 11 (ref 5–15)
BUN: 9 mg/dL (ref 6–20)
CO2: 25 mmol/L (ref 22–32)
Calcium: 9 mg/dL (ref 8.9–10.3)
Chloride: 99 mmol/L (ref 98–111)
Creatinine, Ser: 0.69 mg/dL (ref 0.44–1.00)
GFR calc Af Amer: >60 mL/min (ref >60)
GFR calc non Af Amer: >60 mL/min (ref >60)
Glucose, Bld: 93 mg/dL (ref 70–99)
Potassium: 4.9 mmol/L (ref 3.5–5.1)
Sodium: 135 mmol/L (ref 135–145)
Total Bilirubin: 0.2 mg/dL — ABNORMAL LOW (ref 0.3–1.2)
Total Protein: 7.1 g/dL (ref 6.5–8.1)

## 2019-10-29 LAB — CBC
HCT: 29.7 % — ABNORMAL LOW (ref 36.0–46.0)
Hemoglobin: 9.4 g/dL — ABNORMAL LOW (ref 12.0–15.0)
MCH: 28.6 pg (ref 26.0–34.0)
MCHC: 31.6 g/dL (ref 30.0–36.0)
MCV: 90.3 fL (ref 80.0–100.0)
Platelets: 738 10*3/uL — ABNORMAL HIGH (ref 150–400)
RBC: 3.29 MIL/uL — ABNORMAL LOW (ref 3.87–5.11)
RDW: 13.8 % (ref 11.5–15.5)
WBC: 11.9 10*3/uL — ABNORMAL HIGH (ref 4.0–10.5)
nRBC: 0 % (ref 0.0–0.2)

## 2019-10-29 LAB — MAGNESIUM: Magnesium: 1.6 mg/dL — ABNORMAL LOW (ref 1.7–2.4)

## 2019-10-29 MED ORDER — OXYCODONE HCL 5 MG PO TABS
5.0000 mg | ORAL_TABLET | ORAL | 0 refills | Status: AC | PRN
Start: 2019-10-29 — End: 2019-11-02

## 2019-10-29 MED ORDER — LINEZOLID 600 MG PO TABS: 600.0000 mg | ORAL_TABLET | Freq: Two times a day (BID) | ORAL | 0 refills | Status: DC

## 2019-10-29 MED FILL — LINEZOLID 600 MG TABS: 600 | 28 days supply | Qty: 56 | Fill #0

## 2019-10-29 MED FILL — oxyCODONE HCL 5 MG TABS: 5 | 4 days supply | Qty: 20 | Fill #0

## 2019-10-29 NOTE — Progress Notes (Signed)
[facility] for [facility] Disease  Date of Admission:  10/18/2019      Total days of antibiotics 11             Cefazolin day 10                                                                                        ASSESSMENT: Zipper is feeling well today and continues to improve on treatment for uncomplicated tricuspid valve endocarditis. GIB also seems stable. Pain is controlled on oral regimen. I think she is ready for outpatient care at this time with close follow up and will plan to convert to linezolid BID x 4 weeks.   Drug interaction with lexapro noted - she has had periods of time in the past where she was off lexapro for up to a month. She felt "more moody" during this time frame but overall feels like a 4 week hold now will be OK for her. I will follow up at the 2 week mark to ensure we don't need to change the plan for ongoing antimicrobials.  Will plan repeating CXR and Echo again at completion of therapy outpatient. She has severe TV regurgitation and may need cardiology referral outpatient.   She had first [place] vaccine on 8-11 and would like to get her second dose before she leaves. I would be OK with her receiving, possible side effects from vaccine including fevers, chills, body aches and nausea may be something she experiences. If she experiences side effects beyond 2 days she will call and notify me.      PLAN: 1. Will have transitions of care pharmacy bring her linezolid to the bedside before discharge.  2. Will arrange inpatient Pfizer vaccine if we can before d/c (Dr. Rebeca Alert and his team agree with providing this) - otherwise we can offer her second dose in ID clinic at follow up .   Will sign off - looking forward to continuing her care outpatient      Principal Problem:   MSSA bacteremia Active Problems:   Right shoulder pain   Acute septic pulmonary embolism (HCC)   Endocarditis of tricuspid valve   Shortness of breath    Sepsis (HCC)   GI bleed   Acute blood loss anemia   Gastric ulcer with hemorrhage    diclofenac Sodium  4 g Topical QID   escitalopram  30 mg Oral Daily   gabapentin  400 mg Oral QHS   lidocaine  2 patch Transdermal Q24H   melatonin  3 mg Oral QHS   oxyCODONE  10 mg Oral Q6H   pantoprazole  40 mg Oral BID   scopolamine  1 patch Transdermal Q72H   sodium chloride flush  10-40 mL Intracatheter Q12H    SUBJECTIVE: Doing well. Still some chest pain but getting better. Needs her second COVID vaccine    Review of Systems: Review of Systems  Constitutional: Negative for chills and fever.  HENT: Negative for tinnitus.   Eyes: Negative for blurred vision and photophobia.  Respiratory: Negative for cough and sputum production.   Cardiovascular: Positive for  chest pain.  Gastrointestinal: Negative for diarrhea, nausea and vomiting.  Genitourinary: Negative for dysuria.  Skin: Negative for rash.  Neurological: Negative for headaches.    No Known Allergies  OBJECTIVE: Vitals:   10/28/19 2045 10/28/19 2059 10/29/19 0357 10/29/19 0800  BP: 127/85 127/85 109/63 120/74  Pulse: 98  100   Resp: 18  18 18   Temp: 99.1 F (37.3 C)  (!) 97.5 F (36.4 C) 98.3 F (36.8 C)  TempSrc: Oral  Oral Oral  SpO2: 95%  100%   Weight:      Height:       Body mass index is 18.96 kg/m.  Physical Exam Vitals reviewed.  Constitutional:      Appearance: She is well-developed.     Comments: Resting comfortably in bed   Cardiovascular:     Rate and Rhythm: Normal rate and regular rhythm.     Heart sounds: Murmur heard.  Systolic murmur is present with a grade of 2/6.   Pulmonary:     Effort: Pulmonary effort is normal.     Breath sounds: Normal breath sounds.  Abdominal:     General: Bowel sounds are normal.     Palpations: Abdomen is soft.  Skin:    Capillary Refill: Capillary refill takes less than 2 seconds.  Neurological:     Mental Status: She is alert and oriented to  person, place, and time.     Lab Results Lab Results  Component Value Date   WBC 13.5 (H) 10/27/2019   HGB 8.6 (L) 10/27/2019   HCT 26.5 (L) 10/27/2019   MCV 89.5 10/27/2019   PLT 623 (H) 10/27/2019    Lab Results  Component Value Date   CREATININE 0.54 10/27/2019   BUN <5 (L) 10/27/2019   NA 138 10/27/2019   K 4.3 10/27/2019   CL 101 10/27/2019   CO2 28 10/27/2019    Lab Results  Component Value Date   ALT 20 10/27/2019   AST 30 10/27/2019   ALKPHOS 64 10/27/2019   BILITOT 0.1 (L) 10/27/2019     Microbiology: Recent Results (from the past 240 hour(s))  Blood culture (routine x 2)     Status: Abnormal   Collection Time: 10/19/19  9:57 AM   Specimen: BLOOD RIGHT HAND  Result Value Ref Range Status   Specimen Description BLOOD RIGHT HAND  Final   Special Requests   Final    BOTTLES DRAWN AEROBIC ONLY Blood Culture results may not be optimal due to an inadequate volume of blood received in culture bottles   Culture  Setup Time   Final    GRAM POSITIVE COCCI IN CLUSTERS AEROBIC BOTTLE ONLY CRITICAL RESULT CALLED TO, READ BACK BY AND VERIFIED WITH: PHARMD C AMEND 10/19/19 AT 2328 SK Performed at [facility], [address], [place], [place] [address]    Culture STAPHYLOCOCCUS AUREUS (A)  Final   Report Status 10/21/2019 FINAL  Final   Organism ID, Bacteria STAPHYLOCOCCUS AUREUS  Final      Susceptibility   Staphylococcus aureus - MIC*    CIPROFLOXACIN <=0.5 SENSITIVE Sensitive     ERYTHROMYCIN <=0.25 SENSITIVE Sensitive     GENTAMICIN <=0.5 SENSITIVE Sensitive     OXACILLIN <=0.25 SENSITIVE Sensitive     TETRACYCLINE <=1 SENSITIVE Sensitive     VANCOMYCIN 1 SENSITIVE Sensitive     TRIMETH/SULFA <=10 SENSITIVE Sensitive     CLINDAMYCIN <=0.25 SENSITIVE Sensitive     RIFAMPIN <=0.5 SENSITIVE Sensitive  Inducible Clindamycin NEGATIVE Sensitive     * STAPHYLOCOCCUS AUREUS  Blood Culture ID Panel (Reflexed)     Status: Abnormal   Collection Time: 10/19/19   9:57 AM  Result Value Ref Range Status   Enterococcus faecalis NOT DETECTED NOT DETECTED Final   Enterococcus Faecium NOT DETECTED NOT DETECTED Final   Listeria monocytogenes NOT DETECTED NOT DETECTED Final   Staphylococcus species DETECTED (A) NOT DETECTED Final    Comment: CRITICAL RESULT CALLED TO, READ BACK BY AND VERIFIED WITH: PHARMD C AMEND 10/19/19 AT 2328 SK    Staphylococcus aureus (BCID) DETECTED (A) NOT DETECTED Final    Comment: CRITICAL RESULT CALLED TO, READ BACK BY AND VERIFIED WITH: PHARMD C AMEND 10/19/19 AT 2328 SK    Staphylococcus epidermidis NOT DETECTED NOT DETECTED Final   Staphylococcus lugdunensis NOT DETECTED NOT DETECTED Final   Streptococcus species NOT DETECTED NOT DETECTED Final   Streptococcus agalactiae NOT DETECTED NOT DETECTED Final   Streptococcus pneumoniae NOT DETECTED NOT DETECTED Final   Streptococcus pyogenes NOT DETECTED NOT DETECTED Final   A.calcoaceticus-baumannii NOT DETECTED NOT DETECTED Final   Bacteroides fragilis NOT DETECTED NOT DETECTED Final   Enterobacterales NOT DETECTED NOT DETECTED Final   Enterobacter cloacae complex NOT DETECTED NOT DETECTED Final   Escherichia coli NOT DETECTED NOT DETECTED Final   Klebsiella aerogenes NOT DETECTED NOT DETECTED Final   Klebsiella oxytoca NOT DETECTED NOT DETECTED Final   Klebsiella pneumoniae NOT DETECTED NOT DETECTED Final   Proteus species NOT DETECTED NOT DETECTED Final   Salmonella species NOT DETECTED NOT DETECTED Final   Serratia marcescens NOT DETECTED NOT DETECTED Final   Haemophilus influenzae NOT DETECTED NOT DETECTED Final   Neisseria meningitidis NOT DETECTED NOT DETECTED Final   Pseudomonas aeruginosa NOT DETECTED NOT DETECTED Final   Stenotrophomonas maltophilia NOT DETECTED NOT DETECTED Final   Candida albicans NOT DETECTED NOT DETECTED Final   Candida auris NOT DETECTED NOT DETECTED Final   Candida glabrata NOT DETECTED NOT DETECTED Final   Candida krusei NOT DETECTED  NOT DETECTED Final   Candida parapsilosis NOT DETECTED NOT DETECTED Final   Candida tropicalis NOT DETECTED NOT DETECTED Final   Cryptococcus neoformans/gattii NOT DETECTED NOT DETECTED Final   Meth resistant mecA/C and MREJ NOT DETECTED NOT DETECTED Final    Comment: Performed at [facility], [address] [place], [place] [address]  Blood culture (routine x 2)     Status: Abnormal   Collection Time: 10/19/19 10:07 AM   Specimen: BLOOD RIGHT ARM  Result Value Ref Range Status   Specimen Description BLOOD RIGHT ARM  Final   Special Requests   Final    BOTTLES DRAWN AEROBIC AND ANAEROBIC Blood Culture adequate volume   Culture  Setup Time   Final    GRAM POSITIVE COCCI IN CLUSTERS IN BOTH AEROBIC AND ANAEROBIC BOTTLES CRITICAL VALUE NOTED.  VALUE IS CONSISTENT WITH PREVIOUSLY REPORTED AND CALLED VALUE.    Culture (A)  Final    STAPHYLOCOCCUS AUREUS SUSCEPTIBILITIES PERFORMED ON PREVIOUS CULTURE WITHIN THE LAST 5 DAYS. Performed at [facility], [place] [address], [place], [place] [address]    Report Status 10/21/2019 FINAL  Final  Culture, blood (routine x 2)     Status: None   Collection Time: 10/22/19 12:15 PM   Specimen: BLOOD LEFT HAND  Result Value Ref Range Status   Specimen Description BLOOD LEFT HAND  Final   Special Requests   Final    BOTTLES DRAWN  AEROBIC AND ANAEROBIC Blood Culture results may not be optimal due to an inadequate volume of blood received in culture bottles   Culture   Final    NO GROWTH 5 DAYS Performed at [facility], [place] [address], [place], [place] [address]    Report Status 10/27/2019 FINAL  Final  Culture, blood (routine x 2)     Status: None   Collection Time: 10/22/19  3:45 PM   Specimen: BLOOD LEFT HAND  Result Value Ref Range Status   Specimen Description BLOOD LEFT HAND  Final   Special Requests   Final    BOTTLES DRAWN AEROBIC AND ANAEROBIC Blood Culture results may not be optimal due to an inadequate volume of blood  received in culture bottles   Culture   Final    NO GROWTH 5 DAYS Performed at [facility], [place] [address] [place], [place] [address]    Report Status 10/27/2019 FINAL  Final  C Difficile Quick Screen (NO PCR Reflex)     Status: None   Collection Time: 10/23/19 11:11 AM   Specimen: STOOL  Result Value Ref Range Status   C Diff antigen NEGATIVE NEGATIVE Final   C Diff toxin NEGATIVE NEGATIVE Final   C Diff interpretation No C. difficile detected.  Final    Comment: Performed at [facility], [place] [address], [place], [place] [address]     Janene Madeira, MSN, NP-C [facility] [email or web address] Pager: [phone] Office: [place]: [phone]

## 2019-10-29 NOTE — Care Management (Signed)
1059 10-29-19 MATCH completed for this patient. Medications will be delivered via [place] to the bedside. Case Manager reached out to physician and patient is established at a [facility]one  [facility]. Patient will be established with an appointment at the Internal Medicine Clinic for Suboxone. Physician stated that the appointment will be placed on the AVS. No further needs from Case Manager at this time. Bethena Roys, RN,BSN Case Manager

## 2019-10-29 NOTE — Progress Notes (Signed)
Pt. got up to [facility] when her HR went up ,v/s rechecked.& HR went down to 105

## 2019-10-29 NOTE — Progress Notes (Signed)
   Covid-19 Vaccination Clinic  Name:  Blakleigh Straw    MRN: [id number] DOB: [birth year]  10/29/2019  Ms. Peron was observed post Covid-19 immunization for 15 minutes without incident. She was provided with Vaccine Information Sheet and instruction to access the V-Safe system.   Ms. Schwegel was instructed to call 911 with any severe reactions post vaccine: [place] Kitchen Difficulty breathing  . Swelling of face and throat  . A fast heartbeat  . A bad rash all over body  . Dizziness and weakness   Immunizations Administered    Name Date Dose VIS Date Route   Pfizer COVID-19 Vaccine 10/29/2019  1:21 PM 0.3 mL 04/07/2018 Intramuscular   Manufacturer: [place]   Lot: [id number]   [place]: [id number]

## 2019-10-29 NOTE — Progress Notes (Signed)
°   10/29/19 0329  Assess: MEWS Score  ECG Heart Rate (!) 112  Assess: MEWS Score  MEWS Temp 0  MEWS Systolic 0  MEWS Pulse 2  MEWS RR 0  MEWS LOC 0  MEWS Score 2  MEWS Score Color Yellow  Assess: if the MEWS score is Yellow or Red  Were vital signs taken at a resting state? No  Focused Assessment No change from prior assessment  Early Detection of Sepsis Score *See Row Information* Low  MEWS guidelines implemented *See Row Information* No, vital signs rechecked

## 2019-10-29 NOTE — Discharge Summary (Addendum)
Name: Suzanne [name] MRN: [id number] DOB: 12/01/1990 29 y.o. PCP: Mittie Bodo  Date of Admission: 10/18/2019  8:15 PM Date of Discharge: 10/29/2019 Attending Physician: Dr. Rebeca Alert Discharge Diagnosis: 1. MSSA Bacteremia  2. Tricuspid Valve Endocarditis 3. Septic Pulmonary Emboli  4. Likely Septic R Shoulder  5. Heroine Use Disorder  6. NSAID-induced Gastric and Duodenal Ulcers 7. Upper Gastrointestinal Bleeding 8. MDD 9. ADD  Discharge Medications: Allergies as of 10/29/2019   No Known Allergies      Medication List     STOP taking these medications    dextroamphetamine 10 MG tablet Commonly known as: DEXTROSTAT   escitalopram 20 MG tablet Commonly known as: Lexapro   medroxyPROGESTERone Acetate 150 MG/ML Susy       TAKE these medications    gabapentin 400 MG capsule Commonly known as: NEURONTIN Take 1 capsule (400 mg total) by mouth at bedtime.   linezolid 600 MG tablet Commonly known as: ZYVOX Take 1 tablet (600 mg total) by mouth 2 (two) times daily.   oxyCODONE 5 MG immediate release tablet Commonly known as: Roxicodone Take 1 tablet (5 mg total) by mouth every 4 (four) hours as needed for up to 4 days for severe pain.        Disposition and follow-up:   Suzanne [name] was discharged from [facility] in Stable condition.  At the hospital follow up visit please address:  1.   A. MSSA bacteremia, TV endocarditis, septic pulmonary emboli - Repeat cultures negative. Discharged in moderate pain with short course of oxycodone, and may require additional pain management; however, interested in starting Suboxone treatment. Patient not a candidate for percutaneous angiovac procedure due to location on valve and need for heparinization. Will need linezolid x 4 weeks PO outpatient. Chest wall lesion on ultrasound may need further workup outpatient.    B. Heroin Use Disorder - Instructed patient it is best if she is in moderate withdrawal  prior to initiating suboxone at appointment Tuesday, 11/02/19. She was instructed to take Oxy 5mg  q4hrs PRN at home until Monday evening before her appointment. Interested in therapy.    C. MDD and ADD - Previously took Lexapro and Dexamphetamine daily. It took Tanyiah several days to warm up to staff despite these medications when admitted. These medications will need discontinued for the duration that she is on Linezolid to avoid reaction and restarted afterwards. Patient is interested in counseling and psychiatry.    D. GI Bleeding - Likely NSAID induced. Please avoid all NSAIDs. Patient will also need to take Protonix 40mg  BID x 3-4 weeks and once daily x 2 months prior to discontinuing.   2.  Labs / imaging needed at time of follow-up: CBC, CMP, Magnesium (low on admission)   3.  Pending labs/ test needing follow-up: None  Follow-up Appointments:  Follow-up [place] for Infectious Disease Follow up on 11/09/2019.   Specialty: Infectious Diseases Why: 11:15 am telephone / video appointment with Janene Madeira, NP  We are happy to see you in the office if you prefer - just call ahead to arrange.  Contact information: [place], [place] [id number] [place] [phone]        Internal Medicine Clinic. Go on 11/02/2019.   Why: I have scheduled you an appointment at the [facility] clinic here on the ground floor. Please arrive at 10:45am to see Dr. Gilford Rile. Contact information: Ground Floor - [place]  [facility] [place], [place], [place] [address] [phone]                Hospital Course by problem list:  1. MSSA bacteremia with TV endocarditis, septic pulmonary emboli, suspected septic R shoulder, and R chest wall infection Patient presented with 5 days of CP, SOB, fever, with hx IVDU and septic emboli on CTA chest.  Blood cultures positive for MSSA. TTE performed 10/21/19 unremarkable. TEE performed  9/10 showed a 2 x 1.5 cm vegetation of tricuspid valve with subsidiary elongated 2 cm mobile portion attached to tricuspid apparatus cord. Soft tissue ultrasound of the chest today showed a lenticular hypoechoic mass within the muscles anterior to the sternum (19x25x37mm) with surrounding hypervascularity, most consistent with a solid lesion such as fibroma, less likely hematoma or infection, although no drainable fluid collection was present. HIV, HepBsAg and HCV Ab negative. Repeat blood cultures drawn 10/22/19 resulted in no growth. CT surgery was initially consulted for percutaneous angiovac debulking of her TV vegetation; however, she subsequently developed acute, recurrent, severe GI bleeding and was determined not to be a candidate for such a procedure due to need for heparinization. She clinically improved on 11 days total of IV cefazolin. ID followed her throughout her visit, and she did not receive MRI of her right shoulder due to significant improvement of her pain with intact ROM. She was discharged with 4 weeks of linezolid BID and told to discontinue taking Lexapro and Dexamphetamine due to concern for drug interaction, and was agreeable to this.   2. Heroin Use Disorder  Patient states she has recently began to use heroin again. She states her boyfriend is the driving factor behind her continued drug use and did have a period where she was successful off of heroin, on methadone, but relapsed when her significant other began to use again. She states he is currently in treatment on methadone and she emphasizes a desire to stop using and is interested in considering starting suboxone after many discussions regarding this. Her pain was managed in the hospital initially by IV morphine which was transitioned to Oxycodone 10mg  q6hours with 5mg  q2 hours for breakthrough pain, which patient had been taking consistently. She continued to endorse moderate pain on discharge but was eager to return home and said  she would take her prescribed opioids at discharge as prescribed. She was given a 4 day supply of oxycodone 5mg  q4 hours PRN at discharge and instructed to stop taking these ~12 hours prior to her [facility] appointment for initiation of suboxone. She states she is also interested in counseling and psychiatric services at discharge.    3. Severe, Presumed Upper GI Bleeding 2/2 NSAID Use During admission, patient noted maroonish-brown diarrheal episodes. Later that day she had worsening episodes of bloody bowel movements. Hgb drop from 9.2 to 4.3. Transfused 3 units of blood, hgb improved to 8.9. Next morning dropped again to 6.7, another unit transfused. EGD on 10/23/19 demonstrated non-bleeding gastric and duodenal ulcers, most likely 2/2 frequent NSAID use prior to admission. Hgb remained stable for the rest of admission without further bloody diarrheal episodes. Patient was educated on the importance of avoiding NSAIDs and was started on Protonix PO 40mg  BID. She should continue to take Protonix 40mg  PO BID x 4 weeks then daily for 2 months, per GI.   4. Major Depressive Disorder and ADD Patient is on Lexapro at home. Stated "I just want to die" early in admission, but no active SI. Patient  interested in establishing counseling outpatient and encouraged PCP follow up. Discontinued Lexapro on discharge for possibility of serotonin syndrome with concurrent Linezolid. Patient also has ADD on dexamphetamine at home, and was instructed to stop taking this while on Linezolid as well due to concern for drug interaction. Will require close following at discharge.   Discharge Vitals:   BP 118/85 (BP Location: Right Arm)    Pulse (!) 125    Temp 98.5 F (36.9 C) (Oral)    Resp 18    Ht 5\' 6"  (1.676 m)    Wt 53.3 kg    LMP 10/11/2019    SpO2 98%    BMI 18.96 kg/m   Pertinent Labs, Studies, and Procedures:  Lowest Hgb: 4.3 --> 9.4 WBC - 21.8 --> 11.9  BNP 397.1 Troponin 33 > 31 Magnesium on discharge 1.6 Albumin  at discharge 2.5    CT ANGIOGRAPHY CHEST WITH CONTRAST FINDINGS: Cardiovascular: Satisfactory opacification of the pulmonary arteries to the proximal segmental level. No evidence of pulmonary embolism. Normal heart size. No pericardial effusion. Borderline enlargement of the main pulmonary artery. Mediastinum/Nodes: No enlarged mediastinal, hilar, or axillary lymph nodes. Thyroid gland, trachea, and esophagus demonstrate no significant findings. Mild fat stranding in the anterior mediastinum, favored to reflect residual thymus. Lungs/Pleura: Multifocal bilateral nodular areas of consolidation in bilateral lungs involving all lobes. Lesions within the left upper lobe (series 8, image number 97) and right lower lobe (series 8 image 78) appear to demonstrate early cavitation. Dependent ground-glass opacities, favored to reflect atelectasis. No pleural effusions. No pneumothorax. IMPRESSION: 1. No evidence of acute pulmonary embolism to the level of the proximal segmental pulmonary arteries. 2. Multifocal bilateral nodular areas of consolidation with a few areas of possible early cavitation. Primary differential considerations include septic emboli (favored) and multifocal pneumonia (including atypical infections). Correlate with any history of IV drug use and consider echocardiography to further evaluate. Metastatic disease is felt unlikekely given patient's young age and no known history of malignancy; however, recommend follow-up to resolution. 3. Borderline enlargement of the main pulmonary artery, which can be seen with pulmonary arterial hypertension Electronically Signed   By: Margaretha Sheffield MD   On: 10/19/2019 10:01  ULTRASOUND OF HEAD/NECK SOFT TISSUES FINDINGS: At the site of clinical concern, a lenticular hypoechoic structure is identified anterior to the sternum measuring 19 x 25 mm in size, 7 mm thick. Lesion appears to be located within muscles overlying the  sternum, likely pectoralis major where they meet at the midline. Recent CT exam demonstrates thickening of presternal muscular planes anterior to the sternum/sternomanubrial junction. The posterior aspects of the muscular planes appear normal, with the masslike area located anteriorly. Increased blood flow is seen on color Doppler imaging surrounding lesion, and questionably within the margins of the lesion itself. IMPRESSION: Lenticular hypoechoic mass within the muscles anterior to the sternum likely pectoralis major measuring 19 x 25 x 7 mm in size with surrounding hypervascularity. Lesion has nonspecific imaging characteristics.   Sonographic appearance is more suggestive of a solid lesion, nonspecific soft tissue tumor such as a fibroma, does not appear to represent fat/lipoma by CT; cannot completely exclude organizing hematoma or infection, though this does not appear to represent a drainable fluid collection at this time. Electronically Signed   By: Lavonia Dana M.D.   On: 10/28/2019 12:46  TEE: FINDINGS   Left Ventricle: Left ventricular ejection fraction, by estimation, is 60  to 65%. The left ventricle has normal function. The left ventricular  internal cavity size was normal in size.  Right Ventricle: The right ventricular size is normal. No increase in  right ventricular wall thickness. Right ventricular systolic function is  normal.  Mitral Valve: The mitral valve is normal in structure. Trivial mitral  valve regurgitation.  Tricuspid Valve: There is a large 2 x 1.5 cm spherical vegetation attached  to the lateral chordal valvular support structure within the right  ventricle. There is also an elongated 2cm x 0.5cm mobile vegetation  attached to above vegetation. The tricuspid  valve is abnormal. Tricuspid valve regurgitation is mild. Candee Furbish MD  Electronically signed by Candee Furbish MD  Signature Date/Time: 10/22/2019/2:13:01 PM    Discharge  Instructions: Discharge Instructions     Call MD for:  extreme fatigue   Complete by: As directed    Call MD for:  persistant dizziness or light-headedness   Complete by: As directed    Diet - low sodium heart healthy   Complete by: As directed    Discharge instructions   Complete by: As directed    Ms. Hoppes, it has been a pleasure taking care of you! You were found to have a bloodstream infection which also affected your heart valve. We are treating this with antibiotics. We are also hoping to help with your opioid use. Please use your prescribed medications as we discussed, and follow up with our suboxone clinic for further assistance. You also had a severe gastrointestinal bleed during this hospitalization, which was treated with a blood transfusion. Your blood counts have since been stable. The GI doctor found several ulcers which were likely caused by use of a class of drugs called NSAIDs which include ibuprofen, Aleve, Advil, and Naproxen. Please avoid these.  Here are your discharge instructions. 1) START linezolid, 2 tablets daily for 4 weeks. This is your antibiotic. 2) STAT oxycodone 5mg  every 4 hours as needed for pain. Please take this medication as prescribed. You will need to STOP taking oxycodone around 11:00pm on Monday, 11/01/19 so that you are in withdrawal for your suboxone appointment at [facility] clinic at 10:45am on Tuesday 11/02/19. 3) START voltaren gel and lidocaine patch as needed for pain 4) STOP Lexapro. This may cause a life-threatening reaction with your antibiotic. 5) STOP dextroamphetamine, which may cause the same life-threatening reaction. 6) Follow up with your PCP 7) Follow up with our Suboxone clinic 8) Follow up with Infectious Disease  Thank you!   Increase activity slowly   Complete by: As directed        Signed: Jeralyn Bennett, MD 10/29/2019, 8:17 PM   Pager: [phone]

## 2019-10-29 NOTE — Progress Notes (Signed)
Pt HR in 140s. Pt asymptomatic and stated it went up when she get off the bed to use BSC.

## 2019-10-29 NOTE — Progress Notes (Signed)
Subjective:  Patient is in good spirits today. Her pain is gradually improving. She requests to go home with prescription of pain medication. States that she would even be willing to go home without any prescriptions, but reports still having moderate chest pain, especially with movement. Counseled on staying active and the effect that immobility has on pain. She has naloxone at home and has had to use it previously on her boyfriend. She states that she would not be concerned about him abusing her medications because he is clean now, on methadone, and never liked pills and states that she, herself, would not take the medication differently than prescribed. She is more open to trying Suboxone following discharge this morning. She says she will follow up at our [facility] clinic to receive her prescription after a short course of oxycodone at home. Denies any SOB, fevers, chills, abdominal pain, diarrhea, blood in her stools currently or any other symptoms at this time.   Objective:  Vital signs in last 24 hours: Vitals:   10/28/19 2059 10/29/19 0357 10/29/19 0800 10/29/19 1156  BP: 127/85 109/63 120/74 118/85  Pulse:  100  (!) 125  Resp:  18 18 18   Temp:  (!) 97.5 F (36.4 C) 98.3 F (36.8 C) 98.5 F (36.9 C)  TempSrc:  Oral Oral Oral  SpO2:  100%  98%  Weight:      Height:       Weight change:   Intake/Output Summary (Last 24 hours) at 10/29/2019 1534 Last data filed at 10/29/2019 1430 Gross per 24 hour  Intake 1176.25 ml  Output 2900 ml  Net -1723.75 ml   General: Patient is laying comfortably in bed in NAD. Respiratory: Lungs are CTA, bilaterally. No wheezes, rales, or rhonchi.  Cardiovascular: Rate is normal, rhythm is regular. No murmurs, rubs, or gallops noted. Extremities without edema. Musculoskeletal: Patient has mild, reproducible upper right chest wall TTP with swelling overlying her right sternal area. Abdominal: Soft, without TTP, guarding, or rebound. Bowel sounds  normal. Skin: Pallor significantly improved. No chest wall erythema. No other lesions or rashes noted. Psych: Normal range of affect. Normal tone of voice.   Assessment/Plan:  Principal Problem:   MSSA bacteremia Active Problems:   Shortness of breath   Sepsis ([place])   Right shoulder pain   Acute septic pulmonary embolism (HCC)   Endocarditis of tricuspid valve   GI bleed   Acute blood loss anemia   Gastric ulcer with hemorrhage  # MSSA bacteremia with TV endocarditis, septic pulmonary emboly, and possible septic R shoulder and R chest wall infection; Hx Heroine Use Patient presented with 5 days of CP, SOB, fever, with hx IVDU and septic emboli on CTA chest.  Blood cultures positive for MSSA. TTE performed 10/21/19 unremarkable. 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 Soft tissue ultrasound of the chest today showed a lenticular hypoechoic mass within the muscles anterior to the sternum (19x25x32mm) with surrounding hypervascularity, most consistent with a solid lesion such as fibroma, less likely hematoma or infection, although no drainable fluid collection was present. HIV, HepBsAg and HCV Ab negative. Repeat blood cultures drawn 10/22/19 resulted in no growth. - ID following. Grateful for their recommendations. - Plan is for discharge with linezolid and follow up with infectious disease outpatient; will need to discontinue lexapro and dexamphetamine if started on linezolid given risk of Serotonin syndrome - Patient not a  candidate for angiovac procedure of TV due to positioning of vegetation and need for heparinization following procedure, per Dr. Kipp Brood / CT surgery  - Will give 4 day course of Oxycodone 5mg  q4 hours PRN for home and instruct patient to stop taking these medications the evening prior to her scheduled suboxone appointment at [facility] 11/02/19.  # Resolved, Suspected  Upper GI Bleeding 2/2 NSAID Use Patient has had several maroonish-brown, bloody diarrheal episodes this admission, likely due to gastric vs. Duodenal ulcers visualized on EGD 2/2 frequent NSAID use PTA. Patient has not had any more blood in her stools in the last couple of days. Hgb stable more recently without any further GIB. - Avoid NSAIDs  # R Shoulder Pain with concern for Septic Joint: R shoulder pain improved today with improved ROM.  - ID note MRI not needed at this time, appreciate their recommendations   # Nausea/Vomiting, Resolved Borderline prolonged QTc. - Scopolamine patch no longer required   # MDD: Patient is on Lexapro at home.  - Will need to discontinue while on Linezolid  - Patient interested in establishing counseling outpatient and encouraged PCP follow up   Prior to Admission Living Arrangement: Home  Anticipated Discharge Location: Home Barriers to Discharge: None Anticipated Discharge: This afternoon   Code Status: Full Code Diet: Started on regular diet 10/25/19  IVF: None  DVT PPx: SCD's  LOS: 10 days   Jeralyn Bennett, MD 10/29/2019, 3:34 PM

## 2019-10-29 NOTE — Discharge Instructions (Signed)
Bacteremia, Adult Bacteremia is the presence of bacteria in the blood. When bacteria enter the bloodstream, they can cause a life-threatening reaction called sepsis. Sepsis is a medical emergency. What are the causes? This condition is caused by bacteria that get into the blood. Bacteria can enter the blood from an infection, including:  A skin infection or injury, such as a burn or a cut.  A lung infection (pneumonia).  An infection in the stomach or intestines.  An infection in the bladder or urinary system (urinary tract infection).  A bacterial infection in another part of the body that spreads to the blood. Bacteria can also enter the blood during a dental or medical procedure, from bleeding gums, or through use of an unclean needle. What increases the risk? This condition is more likely to develop in children, older adults, and people who have:  A long-term (chronic) disease or condition like diabetes or chronic kidney failure.  An artificial joint or heart valve, or heart valve disease.  A tube inserted to treat a medical condition, such as a urinary catheter or IV.  A weak disease-fighting system (immune system).  Injected illegal drugs.  Been hospitalized for more than 10 days in a row. What are the signs or symptoms? Symptoms of this condition include:  Fever and chills.  Fast heartbeat and shortness of breath.  Dizziness, weakness, and low blood pressure.  Confusion or anxiety.  Pain in the abdomen, nausea, vomiting, and diarrhea. Bacteremia that has spread to other parts of the body may cause symptoms in those areas. In some cases, there are no symptoms. How is this diagnosed? This condition may be diagnosed with a physical exam and tests, such as:  Blood tests to check for bacteria (cultures) or other signs of infection.  Tests of any tubes that you have had inserted. These tests check for a source of infection.  Urine tests to check for bacteria in the  urine.  Imaging tests, such as an X-ray, a CT scan, an MRI, or a heart ultrasound. These check for a source of infection in other parts of your body, such as your lungs, heart valves, or joints. How is this treated? This condition is usually treated in the hospital. If you are treated at home, you may need to return to the hospital for medicines, blood tests, and evaluation. Treatment may include:  Antibiotic medicines. These may be given by mouth or directly into your blood through an IV. You may need antibiotics for several weeks. At first, you may be given an antibiotic to kill most types of blood bacteria. If tests show that a certain kind of bacteria is causing the problem, you may be given a different antibiotic.  IV fluids.  Removing any catheter or device that could be a source of infection.  Blood pressure and breathing support, if needed.  Surgery to control the source or the spread of infection, such as surgery to remove an implanted device, abscess, or infected tissue. Follow these instructions at home: Medicines  Take over-the-counter and prescription medicines only as told by your health care provider.  If you were prescribed an antibiotic medicine, take it as told by your health care provider. Do not stop taking the antibiotic even if you start to feel better. General instructions   Rest as needed. Ask your health care provider when you may return to normal activities.  Drink enough fluid to keep your urine pale yellow.  Do not use any products that contain nicotine or   tobacco, such as cigarettes, e-cigarettes, and chewing tobacco. If you need help quitting, ask your health care provider.  Keep all follow-up visits as told by your health care provider. This is important. How is this prevented?   Wash your hands regularly with soap and water. If soap and water are not available, use hand sanitizer.  You should wash your hands: ? After using the toilet or changing a  diaper. ? Before preparing, cooking, serving, or eating food. ? While caring for a sick person or while visiting someone in a hospital. ? Before and after changing bandages (dressings) over wounds.  Clean any scrapes or cuts with soap and water and cover them with a clean bandage.  Get vaccinations as recommended by your health care provider.  Practice good oral hygiene. Brush your teeth two times a day, and floss regularly.  Take good care of your skin. This includes bathing and moisturizing on a regular basis. Contact a health care provider if:  Your symptoms get worse, and medicines do not help.  You have severe pain. Get help right away if you have:  Pain.  A fever or chills.  Trouble breathing.  A fast heart rate.  Skin that is blotchy, pale, or clammy.  Confusion.  Weakness.  Lack of energy or unusual sleepiness.  New symptoms that develop after treatment has started. These symptoms may represent a serious problem that is an emergency. Do not wait to see if the symptoms will go away. Get medical help right away. Call your local emergency services (911 in the [place] Do not drive yourself to the hospital. Summary  Bacteremia is the presence of bacteria in the blood. When bacteria enter the bloodstream, they can cause a life-threatening reaction called sepsis.  Bacteremia is usually treated with antibiotic medicines in the hospital.  If you were prescribed an antibiotic medicine, take it as told by your health care provider. Do not stop taking the antibiotic even if you start to feel better.  Get help right away if you have any new symptoms that develop after treatment has started. This information is not intended to replace advice given to you by your health care provider. Make sure you discuss any questions you have with your health care provider. Document Revised: 06/19/2018 Document Reviewed: 06/19/2018 Elsevier Patient Education  [place].   Bacteremia, Adult Bacteremia is the presence of bacteria in the blood. When bacteria enter the bloodstream, they can cause a life-threatening reaction called sepsis. Sepsis is a medical emergency. What are the causes? This condition is caused by bacteria that get into the blood. Bacteria can enter the blood from an infection, including:  A skin infection or injury, such as a burn or a cut.  A lung infection (pneumonia).  An infection in the stomach or intestines.  An infection in the bladder or urinary system (urinary tract infection).  A bacterial infection in another part of the body that spreads to the blood. Bacteria can also enter the blood during a dental or medical procedure, from bleeding gums, or through use of an unclean needle. What increases the risk? This condition is more likely to develop in children, older adults, and people who have:  A long-term (chronic) disease or condition like diabetes or chronic kidney failure.  An artificial joint or heart valve, or heart valve disease.  A tube inserted to treat a medical condition, such as a urinary catheter or IV.  A weak disease-fighting system (immune system).  Injected illegal  drugs.  Been hospitalized for more than 10 days in a row. What are the signs or symptoms? Symptoms of this condition include:  Fever and chills.  Fast heartbeat and shortness of breath.  Dizziness, weakness, and low blood pressure.  Confusion or anxiety.  Pain in the abdomen, nausea, vomiting, and diarrhea. Bacteremia that has spread to other parts of the body may cause symptoms in those areas. In some cases, there are no symptoms. How is this diagnosed? This condition may be diagnosed with a physical exam and tests, such as:  Blood tests to check for bacteria (cultures) or other signs of infection.  Tests of any tubes that you have had inserted. These tests check for a source of infection.  Urine tests to check for  bacteria in the urine.  Imaging tests, such as an X-ray, a CT scan, an MRI, or a heart ultrasound. These check for a source of infection in other parts of your body, such as your lungs, heart valves, or joints. How is this treated? This condition is usually treated in the hospital. If you are treated at home, you may need to return to the hospital for medicines, blood tests, and evaluation. Treatment may include:  Antibiotic medicines. These may be given by mouth or directly into your blood through an IV. You may need antibiotics for several weeks. At first, you may be given an antibiotic to kill most types of blood bacteria. If tests show that a certain kind of bacteria is causing the problem, you may be given a different antibiotic.  IV fluids.  Removing any catheter or device that could be a source of infection.  Blood pressure and breathing support, if needed.  Surgery to control the source or the spread of infection, such as surgery to remove an implanted device, abscess, or infected tissue. Follow these instructions at home: Medicines  Take over-the-counter and prescription medicines only as told by your health care provider.  If you were prescribed an antibiotic medicine, take it as told by your health care provider. Do not stop taking the antibiotic even if you start to feel better. General instructions   Rest as needed. Ask your health care provider when you may return to normal activities.  Drink enough fluid to keep your urine pale yellow.  Do not use any products that contain nicotine or tobacco, such as cigarettes, e-cigarettes, and chewing tobacco. If you need help quitting, ask your health care provider.  Keep all follow-up visits as told by your health care provider. This is important. How is this prevented?   Wash your hands regularly with soap and water. If soap and water are not available, use hand sanitizer.  You should wash your hands: ? After using the  toilet or changing a diaper. ? Before preparing, cooking, serving, or eating food. ? While caring for a sick person or while visiting someone in a hospital. ? Before and after changing bandages (dressings) over wounds.  Clean any scrapes or cuts with soap and water and cover them with a clean bandage.  Get vaccinations as recommended by your health care provider.  Practice good oral hygiene. Brush your teeth two times a day, and floss regularly.  Take good care of your skin. This includes bathing and moisturizing on a regular basis. Contact a health care provider if:  Your symptoms get worse, and medicines do not help.  You have severe pain. Get help right away if you have:  Pain.  A fever or chills.  Trouble breathing.  A fast heart rate.  Skin that is blotchy, pale, or clammy.  Confusion.  Weakness.  Lack of energy or unusual sleepiness.  New symptoms that develop after treatment has started. These symptoms may represent a serious problem that is an emergency. Do not wait to see if the symptoms will go away. Get medical help right away. Call your local emergency services (911 in the [place] Do not drive yourself to the hospital. Summary  Bacteremia is the presence of bacteria in the blood. When bacteria enter the bloodstream, they can cause a life-threatening reaction called sepsis.  Bacteremia is usually treated with antibiotic medicines in the hospital.  If you were prescribed an antibiotic medicine, take it as told by your health care provider. Do not stop taking the antibiotic even if you start to feel better.  Get help right away if you have any new symptoms that develop after treatment has started. This information is not intended to replace advice given to you by your health care provider. Make sure you discuss any questions you have with your health care provider. Document Revised: 06/19/2018 Document Reviewed: 06/19/2018 Elsevier Patient Education  [place].   Chest Wall Pain Chest wall pain is pain in or around the bones and muscles of your chest. Sometimes, an injury causes this pain. Excessive coughing or overuse of arm and chest muscles may also cause chest wall pain. Sometimes, the cause may not be known. This pain may take several weeks or longer to get better. Follow these instructions at home: Managing pain, stiffness, and swelling   If directed, put ice on the painful area: ? Put ice in a plastic bag. ? Place a towel between your skin and the bag. ? Leave the ice on for 20 minutes, 2-3 times per day. Activity  Rest as told by your health care provider.  Avoid activities that cause pain. These include any activities that use your chest muscles or your abdominal and side muscles to lift heavy items. Ask your health care provider what activities are safe for you. General instructions   Take over-the-counter and prescription medicines only as told by your health care provider.  Do not use any products that contain nicotine or tobacco, such as cigarettes, e-cigarettes, and chewing tobacco. These can delay healing after injury. If you need help quitting, ask your health care provider.  Keep all follow-up visits as told by your health care provider. This is important. Contact a health care provider if:  You have a fever.  Your chest pain becomes worse.  You have new symptoms. Get help right away if:  You have nausea or vomiting.  You feel sweaty or light-headed.  You have a cough with mucus from your lungs (sputum) or you cough up blood.  You develop shortness of breath. These symptoms may represent a serious problem that is an emergency. Do not wait to see if the symptoms will go away. Get medical help right away. Call your local emergency services (911 in the [place] Do not drive yourself to the hospital. Summary  Chest wall pain is pain in or around the bones and muscles of your chest.  Depending on the  cause, it may be treated with ice, rest, medicines, and avoiding activities that cause pain.  Contact a health care provider if you have a fever, worsening chest pain, or new symptoms.  Get help right away if you feel light-headed or you develop shortness of breath. These symptoms may be  an emergency. This information is not intended to replace advice given to you by your health care provider. Make sure you discuss any questions you have with your health care provider. Document Revised: 07/31/2017 Document Reviewed: 07/31/2017 Elsevier Patient Education  [place].   Chest Wall Pain Chest wall pain is pain in or around the bones and muscles of your chest. Chest wall pain may be caused by:  An injury.  Coughing a lot.  Using your chest and arm muscles too much. Sometimes, the cause may not be known. This pain may take a few weeks or longer to get better. Follow these instructions at home: Managing pain, stiffness, and swelling If told, put ice on the painful area:  Put ice in a plastic bag.  Place a towel between your skin and the bag.  Leave the ice on for 20 minutes, 2-3 times a day.  Activity  Rest as told by your doctor.  Avoid doing things that cause pain. This includes lifting heavy items.  Ask your doctor what activities are safe for you. General instructions   Take over-the-counter and prescription medicines only as told by your doctor.  Do not use any products that contain nicotine or tobacco, such as cigarettes, e-cigarettes, and chewing tobacco. If you need help quitting, ask your doctor.  Keep all follow-up visits as told by your doctor. This is important. Contact a doctor if:  You have a fever.  Your chest pain gets worse.  You have new symptoms. Get help right away if:  You feel sick to your stomach (nauseous) or you throw up (vomit).  You feel sweaty or light-headed.  You have a cough with mucus from your lungs (sputum) or you cough up  blood.  You are short of breath. These symptoms may be an emergency. Do not wait to see if the symptoms will go away. Get medical help right away. Call your local emergency services (911 in the [place] Do not drive yourself to the hospital. Summary  Chest wall pain is pain in or around the bones and muscles of your chest.  It may be treated with ice, rest, and medicines. Your condition may also get better if you avoid doing things that cause pain.  Contact a doctor if you have a fever, chest pain that gets worse, or new symptoms.  Get help right away if you feel light-headed or you get short of breath. These symptoms may be an emergency. This information is not intended to replace advice given to you by your health care provider. Make sure you discuss any questions you have with your health care provider. Document Revised: 07/31/2017 Document Reviewed: 07/31/2017 Elsevier Patient Education  2020 [place] Drug Use Information, Adult Illegal drugs are chemicals and substances that are illegal to use, sell, or have (possess). Health care providers and pharmacies do not use or carry these types of drugs to treat medical problems because they can cause serious side effects and can lead to death. Examples of illegal drugs include:  Cocaine or crack.  Meth (methamphetamine) or crystal meth.  "Bath salts" (synthetic cathinones).  Heroin.  LSD, PCP, or acid.  Ecstasy. What is drug dependence? Using illegal drugs often leads to dependence or addiction. When you use certain drugs over a long period of time, your brain chemistry changes so that you can no longer function normally without that drug. This is called drug dependence. Drug dependence can cause you to:  Have unpleasant feelings and  physical problems when you stop using the drug (withdrawal).  Be unable to perform at work or at home, or do the activities you used to do, without using the drug. Some drugs make people  feel so good that they want to use the drug again and again. This is called drug addiction. People who are addicted spend a lot of time seeking out the drug so that they can get the feeling they want from it. Addiction and dependence can be very hard to overcome. How can illegal drug use and dependence affect me? Using an illegal drug only once can have a major impact on your life. It is possible to die from side effects after using a drug just once. If you use an illegal drug repeatedly, you may need to take larger and larger doses of the drug to experience the feelings you want. Drug dependency and addiction may lead to:  Being unable to care for yourself and others.  Withdrawal, if you stop using the drug.  Negative effects on your relationships and work performance. It causes others not to trust you. If you have children, you could lose custody of them.  Behaving in ways that do not match your values.  Lying and crime, such as stealing.  Jail or prison. This can affect your ability to find a good job or continue your education.  Health problems such as tooth loss, skin problems, heart and lung disease, and stomach problems.  If you are a woman, you may have problems with pregnancy, including: ? Losing the pregnancy early (miscarriage), early delivery (premature birth), or delivering a lifeless infant (stillbirth). ? Slow or abnormal growth (birth defects) of your unborn child. ? Giving birth to a newborn who is addicted to illegal drugs.  A drug overdose. This is a dangerous situation that requires hospitalization and often leads to death. What are the benefits of avoiding illegal drug use? Avoiding illegal drug use can:  Keep your mind and body healthy. This can help improve work performance and participation in activities.  Keep you from developing drug dependency or addiction. This can help you avoid negative side effects such as withdrawal and overdose.  Help you have healthy  relationships with your friends and family.  Help you have more stable finances. Instead of using your money for drugs, you can: ? Spend it on things you would like to have. ? Save it for future use, such as for retirement or for big purchases like a car or a house.  Help you avoid a permanent criminal record, jail time, or prison time. Having a clean record allows you to have more job and educational opportunities in the future. What actions can be taken? To avoid using illegal drugs:  Find healthy ways to cope with stress, such as exercise, meditation, or spending time with family and friends. Talk with your health care provider about how you feel and how to cope with stress.  Spend time with people who do not use illegal drugs, or make new friends who do not use drugs.  Do something else instead of using drugs. You can exercise, take up a hobby, or participate in activities that you can do with others.  Do not be afraid to say no if someone offers you an illegal drug. Speak up about why you do not want to use drugs. You can be a positive role model for others.  Work with a health care provider or counselor to create a program for yourself to  help you deal with various aspects of your addiction. Where to find more information You can find more information about illegal drug use, dependence, and addiction from:  Your health care provider or mental health counselor.  Narcotics Anonymous: www.na.org  Substance Abuse and Mental Health Services Administration: ? Treatment finder: [facility].com ? National helpline: 1-800-662-HELP 425-710-6217) Contact a health care provider if:  You use illegal drugs.  You have missed family activities or work to use illegal drugs.  You have lied or stolen to get illegal drugs.  You lose interest in things you used to enjoy, like hobbies or family activities.  Your eating or sleeping habits change as a result of drug use.  You use  medicine to get the same effects as a drug. This is illegal use and can become addictive as well.  You stopped illegal drug use previously and you start actively using it again (relapse).  You want help to change your addictive behavior. Get help right away if:  You have thoughts about hurting yourself or others. If you ever feel like you may hurt yourself or others, or have thoughts about taking your own life, get help right away. You can go to your nearest emergency department or call:  Your local emergency services (911 in the [place]  A suicide crisis helpline, such as the [facility] at 629 741 4470. This is open 24 hours a day. Summary  Illegal drugs are chemicals and substances that are illegal to use, sell, or possess.  Using illegal drugs often leads to dependence or addiction. This means that you need the drug to feel normal.  If you use illegal drugs, look for resources to help you quit and find healthy ways to cope with stress. This information is not intended to replace advice given to you by your health care provider. Make sure you discuss any questions you have with your health care provider. Document Revised: 10/21/2018 Document Reviewed: 07/24/2016 Elsevier Patient Education  [place].   Cirrhosis  Cirrhosis is long-term (chronic) liver injury. The liver is the body's largest internal organ, and it performs many functions. It converts food into energy, removes toxic material from the blood, makes important proteins, and absorbs necessary vitamins from food. In cirrhosis, healthy liver cells are replaced by scar tissue. This prevents blood from flowing through the liver, making it difficult for the liver to function. Scarring of the liver cannot be reversed, but treatment can prevent it from getting worse. What are the causes? Common causes of this condition are hepatitis C and long-term alcohol abuse. Other causes  include:  Nonalcoholic fatty liver disease. This happens when fat is deposited in the liver by causes other than alcohol.  Hepatitis B infection.  Autoimmune hepatitis. In this condition, the body's defense system (immune system) mistakenly attacks the liver cells, causing irritation and swelling (inflammation).  Diseases that cause blockage of ducts inside the liver.  Inherited liver diseases, such as hemochromatosis. This is one of the most common inherited liver diseases. In this disease, deposits of iron collect in the liver and other organs.  Reactions to certain long-term medicines, such as amiodarone, a heart medicine.  Parasitic infections. These include schistosomiasis, which is caused by a flatworm.  Long-term contact to certain toxins. These toxins include certain organic solvents, such as toluene and chloroform. What increases the risk? You are more likely to develop this condition if:  You have certain types of viral hepatitis.  You abuse alcohol, especially if you are  female.  You are overweight.  You share needles.  You have unprotected sex with someone who has viral hepatitis. What are the signs or symptoms? You may not have any signs and symptoms at first. Symptoms may not develop until the damage to your liver starts to get worse. Early symptoms may include:  Weakness and tiredness (fatigue).  Changes in sleep patterns or having trouble sleeping.  Itchiness.  Tenderness in the right-upper part of your abdomen.  Weight loss and muscle loss.  Nausea.  Loss of appetite.  Appearance of tiny blood vessels under the skin. Later symptoms may include:  Fatigue or weakness that is getting worse.  Yellow skin and eyes (jaundice).  Buildup of fluid in the abdomen (ascites). You may notice that your clothes are tight around your waist.  Weight gain.  Swelling of the feet and ankles (edema).  Trouble breathing.  Easy bruising and  bleeding.  Vomiting blood.  Black or bloody stool.  Mental confusion. How is this diagnosed? Your health care provider may suspect cirrhosis based on your symptoms and medical history, especially if you have other medical conditions or a history of alcohol abuse. Your health care provider will do a physical exam to feel your liver and to check for signs of cirrhosis. He or she may perform other tests, including:  Blood tests to check: ? For hepatitis B or C. ? Kidney function. ? Liver function.  Imaging tests such as: ? MRI or CT scan to look for changes seen in advanced cirrhosis. ? Ultrasound to see if normal liver tissue is being replaced by scar tissue.  A procedure in which a long needle is used to take a sample of liver tissue to be checked in a lab (biopsy). Liver biopsy can confirm the diagnosis of cirrhosis. How is this treated? Treatment for this condition depends on how damaged your liver is and what caused the damage. It may include treating the symptoms of cirrhosis, or treating the underlying causes in order to slow the damage. Treatment may include:  Making lifestyle changes, such as: ? Eating a healthy diet. You may need to work with your health care provider or a diet and nutrition specialist (dietitian) to develop an eating plan. ? Restricting salt intake. ? Maintaining a healthy weight. ? Not abusing drugs or alcohol.  Taking medicines to: ? Treat liver infections or other infections. ? Control itching. ? Reduce fluid buildup. ? Reduce certain blood toxins. ? Reduce risk of bleeding from enlarged blood vessels in the stomach or esophagus (varices).  Liver transplant. In this procedure, a liver from a donor is used to replace your diseased liver. This is done if cirrhosis has caused liver failure. Other treatments and procedures may be done depending on the problems that you get from cirrhosis. Common problems include liver-related kidney failure (hepatorenal  syndrome). Follow these instructions at home:   Take medicines only as told by your health care provider. Do not use medicines that are toxic to your liver. Ask your health care provider before taking any new medicines, including over-the-counter medicines.  Rest as needed.  Eat a well-balanced diet. Ask your health care provider or dietitian for more information.  Limit your salt or water intake, if your health care provider asks you to do this.  Do not drink alcohol. This is especially important if you are taking acetaminophen.  Keep all follow-up visits as told by your health care provider. This is important. Contact a health care provider if you:  Have fatigue or weakness that is getting worse.  Develop swelling of the hands, feet, legs, or face.  Have a fever.  Develop loss of appetite.  Have nausea or vomiting.  Develop jaundice.  Develop easy bruising or bleeding. Get help right away if you:  Vomit bright red blood or a material that looks like coffee grounds.  Have blood in your stools.  Notice that your stools appear black and tarry.  Become confused.  Have chest pain or trouble breathing. Summary  Cirrhosis is chronic liver injury. Liver damage cannot be reversed. Common causes are hepatitis C and long-term alcohol abuse.  Tests used to diagnose cirrhosis include blood tests, imaging tests, and liver biopsy.  Treatment for this condition involves treating the underlying cause. Avoid alcohol, drugs, salt, and medicines that may damage your liver.  Contact your health care provider if you develop ascites, edema, jaundice, fever, nausea or vomiting, easy bruising or bleeding, or worsening fatigue. This information is not intended to replace advice given to you by your health care provider. Make sure you discuss any questions you have with your health care provider. Document Revised: 05/20/2018 Document Reviewed: 12/18/2016 Elsevier Patient Education  [place].   Chest Wall Pain Chest wall pain is pain in or around the bones and muscles of your chest. Sometimes, an injury causes this pain. Excessive coughing or overuse of arm and chest muscles may also cause chest wall pain. Sometimes, the cause may not be known. This pain may take several weeks or longer to get better. Follow these instructions at home: Managing pain, stiffness, and swelling   If directed, put ice on the painful area: ? Put ice in a plastic bag. ? Place a towel between your skin and the bag. ? Leave the ice on for 20 minutes, 2-3 times per day. Activity  Rest as told by your health care provider.  Avoid activities that cause pain. These include any activities that use your chest muscles or your abdominal and side muscles to lift heavy items. Ask your health care provider what activities are safe for you. General instructions   Take over-the-counter and prescription medicines only as told by your health care provider.  Do not use any products that contain nicotine or tobacco, such as cigarettes, e-cigarettes, and chewing tobacco. These can delay healing after injury. If you need help quitting, ask your health care provider.  Keep all follow-up visits as told by your health care provider. This is important. Contact a health care provider if:  You have a fever.  Your chest pain becomes worse.  You have new symptoms. Get help right away if:  You have nausea or vomiting.  You feel sweaty or light-headed.  You have a cough with mucus from your lungs (sputum) or you cough up blood.  You develop shortness of breath. These symptoms may represent a serious problem that is an emergency. Do not wait to see if the symptoms will go away. Get medical help right away. Call your local emergency services (911 in the [place] Do not drive yourself to the hospital. Summary  Chest wall pain is pain in or around the bones and muscles of your chest.  Depending on the  cause, it may be treated with ice, rest, medicines, and avoiding activities that cause pain.  Contact a health care provider if you have a fever, worsening chest pain, or new symptoms.  Get help right away if you feel light-headed or you develop shortness of breath.  These symptoms may be an emergency. This information is not intended to replace advice given to you by your health care provider. Make sure you discuss any questions you have with your health care provider. Document Revised: 07/31/2017 Document Reviewed: 07/31/2017 Elsevier Patient Education  2020 [employer].

## 2019-11-02 ENCOUNTER — Ambulatory Visit: Payer: Medicaid Other

## 2019-11-02 ENCOUNTER — Encounter: Payer: Medicaid Other | Admitting: Internal Medicine

## 2019-11-05 DIAGNOSIS — M25511 Pain in right shoulder: Secondary | ICD-10-CM

## 2019-11-09 ENCOUNTER — Telehealth (INDEPENDENT_AMBULATORY_CARE_PROVIDER_SITE_OTHER): Payer: Medicaid Other | Admitting: Infectious Diseases

## 2019-11-09 ENCOUNTER — Other Ambulatory Visit: Payer: Self-pay

## 2019-11-09 ENCOUNTER — Encounter: Payer: Self-pay | Admitting: Infectious Diseases

## 2019-11-09 ENCOUNTER — Ambulatory Visit
Admission: RE | Admit: 2019-11-09 | Discharge: 2019-11-09 | Disposition: A | Discharge status: Home / Self Care | Payer: Medicaid Other | Source: Ambulatory Visit | Attending: Infectious Diseases | Admitting: Infectious Diseases

## 2019-11-09 DIAGNOSIS — F321 Major depressive disorder, single episode, moderate: Secondary | ICD-10-CM

## 2019-11-09 DIAGNOSIS — R110 Nausea: Secondary | ICD-10-CM

## 2019-11-09 DIAGNOSIS — I079 Rheumatic tricuspid valve disease, unspecified: Secondary | ICD-10-CM

## 2019-11-09 DIAGNOSIS — M25511 Pain in right shoulder: Secondary | ICD-10-CM

## 2019-11-09 DIAGNOSIS — I2690 Septic pulmonary embolism without acute cor pulmonale: Secondary | ICD-10-CM

## 2019-11-09 DIAGNOSIS — B9561 Methicillin susceptible Staphylococcus aureus infection as the cause of diseases classified elsewhere: Secondary | ICD-10-CM

## 2019-11-09 DIAGNOSIS — R7881 Bacteremia: Secondary | ICD-10-CM

## 2019-11-09 MED ORDER — ONDANSETRON 4 MG PO TBDP: 4.0000 mg | ORAL_TABLET | Freq: Three times a day (TID) | ORAL | 0 refills | Status: DC | PRN

## 2019-11-09 NOTE — Progress Notes (Signed)
Name: [name]  DOB: 1990/08/15 MRN: [id number] PCP: Suzanne [facility], PA-C   VIRTUAL CARE ENCOUNTER  I connected with [name] on 11/11/19 at 11:15 AM EDT by VIDEO and verified that I am speaking with the correct person using two identifiers.   I discussed the limitations, risks, security and privacy concerns of performing an evaluation and management service by virtual methods and the availability of in person appointments. I also discussed with the patient that there may be a patient responsible charge related to this service. The patient expressed understanding and agreed to proceed.  Patient Location: [name] residence   Other Participants:   Provider Location: [facility] Office    Patient Active Problem List   Diagnosis Date Noted  . 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 MSSA bacteremia 10/20/2019    Priority: High  . Right shoulder pain 10/20/2019    Priority: High  . Acute septic pulmonary embolism ([name]) 10/20/2019    Priority: High  . GI bleed 10/25/2019  . Acute blood loss anemia 10/25/2019  . Gastric ulcer with hemorrhage   . Irritability 03/29/2016  . Sinus tachycardia 08/07/2015  . Shortness of breath 08/07/2015  . SVT (supraventricular tachycardia) ([name]) 05/17/2015  . Major depression 05/03/2015  . Wolff-Parkinson-White syndrome 05/18/2014  . Smoker 05/18/2014  . ADD (attention deficit disorder) 08/18/2013      Subjective:   Chief Complaint  Patient presents with  . Follow-up    Ongoing and worsening right shoulder pain and limited mobiltiy Nausea Sharp chest pain     HPI: [name] is a 29 y.o. female who was admitted [name], [facility] for chest pain, fever.  Found to have MSSA bacteremia complicated by tricuspid valve endocarditis [place] emboli.  Hospitalization also complicated by GI bleeding.  Due to this risk she was unable to undergo percutaneous angio back debulking of her tricuspid valve.  She  received 11 days of IV cefazolin the inpatient stay.  Her improvement, clearance of blood cultures and desire to go home she was transitioned to linezolid twice daily to complete 4 more weeks.  Since discharge she has noticed some increased pain of the right shoulder.  She has limited mobility and has a hard time raising elbow above shoulder height.  She does not describe any chills, fevers.  She does have some nausea that has increased since discontinuing the scopolamine patches she used inpatient.  Thinks it might be related to her oral antibiotics she is worried that her antibiotic or not working.  She went to work today and had an episode where she had sharp anterior stabbing chest pain.  She has no cough.  There is no redness or swelling or pain with palpation over the site. She denies any orthopnea symptoms. No peripheral edema.   She feels like she is doing okay after stopping her Lexapro. She has follow-up with a new PCP at the end of the week.   Review of Systems  Constitutional: Negative for chills, fever, malaise/fatigue and weight loss.  Respiratory: Negative for cough and shortness of breath.   Cardiovascular: Positive for chest pain. Negative for orthopnea and leg swelling.  Gastrointestinal: Positive for nausea. Negative for abdominal pain, diarrhea and vomiting.  Genitourinary: Negative for dysuria and hematuria.  Musculoskeletal: Positive for joint pain. Negative for back pain.  Skin: Negative for itching and rash.  Neurological: Negative for dizziness, focal weakness, weakness and headaches.    Past Medical History:  Diagnosis Date  . 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 Depression   . Lipoma of neck 04/2017   posterior  . Scoliosis    idiopathic   . Sepsis ([place])   . WPW (Wolff-Parkinson-White syndrome)     Outpatient Medications Prior to Visit  Medication Sig Dispense Refill  . dextroamphetamine (DEXTROSTAT) 10 MG tablet Take 10 mg by  mouth daily as needed.    . gabapentin (NEURONTIN) 400 MG capsule Take 1 capsule (400 mg total) by mouth at bedtime. 90 capsule 1  . linezolid (ZYVOX) 600 MG tablet Take 1 tablet (600 mg total) by mouth 2 (two) times daily. 56 tablet 0   No facility-administered medications prior to visit.     No Known Allergies  Social History   Tobacco Use  . Smoking status: Current Every Day Smoker    Packs/day: 0.00    Years: 7.00    Pack years: 0.00    Types: Cigarettes  . Smokeless tobacco: Never Used  . Tobacco comment: vapes daily   Vaping Use  . Vaping Use: Every day  Substance Use Topics  . Alcohol use: Yes    Alcohol/week: 1.0 standard drink    Types: 1 Glasses of wine per week    Comment: 1 glass wine/day  . Drug use: Not Currently    Types: Marijuana, IV, Heroin    Comment: last used 04/13/2017    Family History  Problem Relation Age of Onset  . Other Brother        substance abuse  . Hyperlipidemia Maternal Grandfather   . Anxiety disorder Maternal Grandfather   . Alcoholism Maternal Grandfather   . Anxiety disorder Mother   . ADD / ADHD Father   . Drug abuse Father   . ADD / ADHD Brother   . Other Brother        bone marrow transplant  . Breast cancer Paternal Grandmother   . Aplastic anemia Brother   . Hypertension Maternal Grandmother   . Alcoholism Maternal Grandmother   . Diabetes Paternal Grandfather   . Cancer Paternal Grandfather   . Arrhythmia Brother   . Alcoholism Other   . Alcoholism Maternal Uncle     Social History   Substance and Sexual Activity  Sexual Activity Yes  . Partners: Male  . Birth control/protection: None     Objective:  There were no vitals filed for this visit. There is no height or weight on file to calculate BMI.  Physical Exam Constitutional:      Appearance: Normal appearance. She is not ill-appearing.  HENT:     Mouth/Throat:     Mouth: Mucous membranes are moist.     Pharynx: Oropharynx is clear.  Eyes:      General: No scleral icterus. Pulmonary:     Effort: Pulmonary effort is normal.  Musculoskeletal:     Comments: [name] was able to demonstrate to me over the video limited mobility of the right shoulder.  She can only abduct her elbow around 45 to 60 degrees, to where her elbow is below shoulder height.  Neurological:     Mental Status: She is oriented to person, place, and time.  Psychiatric:        Mood and Affect: Mood normal.        Thought Content: Thought content normal.     Lab Results Lab Results  Component Value Date   WBC 7.4 11/09/2019   HGB 9.3 (L) 11/09/2019   HCT 28.3 (  L) 11/09/2019   MCV 88.4 11/09/2019   PLT 623 (H) 11/09/2019    Lab Results  Component Value Date   CREATININE 0.69 10/29/2019   BUN 9 10/29/2019   NA 135 10/29/2019   K 4.9 10/29/2019   CL 99 10/29/2019   CO2 25 10/29/2019    Lab Results  Component Value Date   ALT 22 10/29/2019   AST 27 10/29/2019   ALKPHOS 78 10/29/2019   BILITOT 0.2 (L) 10/29/2019    Lab Results  Component Value Date   CHOL 193 09/13/2016   HDL 115 09/13/2016   LDLCALC 23 09/13/2016   TRIG 275 (H) 09/13/2016   CHOLHDL 1.7 09/13/2016   HIV-1 RNA Viral Load (copies/mL)  Date Value  04/18/2017 <20     Assessment & Plan:   Problem List Items Addressed This Visit      High   Right shoulder pain - Primary    Unfortunately since discharge from the [name] her right shoulder pain has increased.  We were concerned she had a septic shoulder during current hospitalization.  Given that she had staph bacteremia this is still high on the differential especially with no other explanation for injury.  We will try and obtain an outpatient MRI and urgent evaluation with orthopedic surgery team.  Lab work will be pending.  Going to reach out to her new PCP for her visit this week to see if pain is about the same or worsening.  May be more expeditious to readmit to [name] resume IV cefazolin and obtained MRI with surgery  consultation there.      Relevant Orders   Sedimentation rate (Completed)   C-reactive protein (Completed)   Ambulatory referral to Orthopedic Surgery   MSSA bacteremia    No fevers or chills, given burden of infection however and complaints she voiced to me today we will have her come for repeat blood cultures today.  If positive we will recommend readmission through the ER.      Endocarditis of tricuspid valve    Zag[place] had 2 large vegetations noted on TEE.  Dr. Kipp Brood evaluated and was unable to debulk these with angio back given recent GI bleed and anatomy of vegetations on the underside of the valve making the surgery success less likely in the risk to great.  No fevers or chills, continues on linezolid twice a day.  This is causing some nausea, I will send in some dissolvable Zofran for her to use.  Blood cultures pending.  She does not have any findings per report of heart failure.  She has an upcoming evaluation with a new PCP later this week.      Relevant Orders   DG Chest 2 View (Completed)   CBC with Differential/Platelet (Completed)   Culture, blood (single)   Culture, blood (single)   Acute septic pulmonary embolism ([facility])    Her chest pain has been improving until abruptly today when she returned to work.  Unsure if this is anxiety, musculoskeletal.  With no productive cough or signs of pleurisy hopefully no worsening of her cavitary pulmonary nodules from tricuspid valve endocarditis.  Will check chest x-ray today.      Relevant Orders   DG Chest 2 View (Completed)     Unprioritized   Major depression    Seems to be doing okay off Lexapro due to interaction with linezolid.       Other Visit Diagnoses    Nausea       Relevant  Medications   ondansetron (ZOFRAN ODT) 4 MG disintegrating tablet      Janene Madeira, MSN, NP-C [facility] for [facility] [place] Pager: [phone] Office: [phone]  11/11/19  8:07 AM

## 2019-11-10 ENCOUNTER — Encounter: Payer: Medicaid Other | Admitting: Student

## 2019-11-10 ENCOUNTER — Telehealth: Payer: Self-pay | Admitting: Student

## 2019-11-10 NOTE — Telephone Encounter (Signed)
TOC HFU [facility] WITH DR [facility] FOR 11/11/2019 @ 10:15AM PER DR Konrad Penta

## 2019-11-11 ENCOUNTER — Ambulatory Visit (INDEPENDENT_AMBULATORY_CARE_PROVIDER_SITE_OTHER): Payer: Self-pay | Admitting: Student

## 2019-11-11 ENCOUNTER — Encounter: Payer: Self-pay | Admitting: Student

## 2019-11-11 ENCOUNTER — Other Ambulatory Visit: Payer: Self-pay

## 2019-11-11 VITALS — BP 140/96 | HR 140 | Temp 97.7°F | Wt 119.2 lb

## 2019-11-11 DIAGNOSIS — R000 Tachycardia, unspecified: Secondary | ICD-10-CM

## 2019-11-11 DIAGNOSIS — F1190 Opioid use, unspecified, uncomplicated: Secondary | ICD-10-CM | POA: Insufficient documentation

## 2019-11-11 DIAGNOSIS — I330 Acute and subacute infective endocarditis: Secondary | ICD-10-CM

## 2019-11-11 DIAGNOSIS — M25511 Pain in right shoulder: Secondary | ICD-10-CM

## 2019-11-11 DIAGNOSIS — B9561 Methicillin susceptible Staphylococcus aureus infection as the cause of diseases classified elsewhere: Secondary | ICD-10-CM

## 2019-11-11 DIAGNOSIS — F1199 Opioid use, unspecified with unspecified opioid-induced disorder: Secondary | ICD-10-CM

## 2019-11-11 MED ORDER — OXYCODONE HCL 5 MG PO TABS: 5.0000 mg | ORAL_TABLET | Freq: Four times a day (QID) | ORAL | 0 refills | Status: AC | PRN

## 2019-11-11 MED FILL — oxyCODONE HCL 5 MG TABS: 5 | 4 days supply | Qty: 15 | Fill #0

## 2019-11-11 NOTE — Assessment & Plan Note (Signed)
[place] had 2 large vegetations noted on TEE.  Dr. Kipp Brood evaluated and was unable to debulk these with angio back given recent GI bleed and anatomy of vegetations on the underside of the valve making the surgery success less likely in the risk to great.  No fevers or chills, continues on linezolid twice a day.  This is causing some nausea, I will send in some dissolvable Zofran for her to use.  Blood cultures pending.  She does not have any findings per report of heart failure.  She has an upcoming evaluation with a new PCP later this week.

## 2019-11-11 NOTE — Assessment & Plan Note (Signed)
Unfortunately since discharge from the hospital her right shoulder pain has increased.  We were concerned she had a septic shoulder during current hospitalization.  Given that she had staph bacteremia this is still high on the differential especially with no other explanation for injury.  We will try and obtain an outpatient MRI and urgent evaluation with orthopedic surgery team.  Lab work will be pending.  Going to reach out to her new PCP for her visit this week to see if pain is about the same or worsening.  May be more expeditious to readmit to hospital resume IV cefazolin and obtained MRI with surgery consultation there.

## 2019-11-11 NOTE — Assessment & Plan Note (Addendum)
Patient presents today for continued right shoulder pain after being discharged from hospital with MSSA bacteremia, tricuspid endocarditis on 10/29/19. When she was discharged, she felt as if the right shoulder pain was improving and reports ability to raise arm above head with minimal pain. Says pain has worsened since discharge and has not improved. Mentions shoulder is not painful with passive movement but unable to perform active movement. States she has taken linezolid daily as prescribed. Denies fevers, shortness of breath, erythema. Says she was able to stretch out the oxycodone given at discharge to earlier this week. Since then, she endorses low energy, nausea, intermittent upper abdominal pain. Mentions she had to take Adderall this morning to get out of bed and come to clinic.  A/P: -Afebrile, tachycardic to 140 in clinic today -Patient has previously scheduled MRI in one week and an appointment with orthopedic surgeon next week. Discussed with patient likely has septic shoulder and will need imaging for this. Recommended patient be admitted from clinic or go to the Emergency Department to obtain imaging and surgery consult quickly. Patient declined, stating that she "has survived this far since discharge and another few days will not hurt her." Says she will follow-up with her appointments next week. -Tachycardia has multiple possible etiologies, including amphetamine use, opioid withdrawal, and infection.  -Prescribed oxycodone 5mg  every 6 hours as needed for the next 5 days. Instructed patient to visit the Emergency Department if she develops fever, worsening shoulder pain, palpitations, or chest pain. -Discussed with Janene Madeira, NP as she is also working with Ms. Deloach. Appreciate Stephanie's help in recommendations and collaboration in care for the patient.

## 2019-11-11 NOTE — Assessment & Plan Note (Signed)
Her chest pain has been improving until abruptly today when she returned to work.  Unsure if this is anxiety, musculoskeletal.  With no productive cough or signs of pleurisy hopefully no worsening of her cavitary pulmonary nodules from tricuspid valve endocarditis.  Will check chest x-ray today.

## 2019-11-11 NOTE — Progress Notes (Signed)
   CC: hospital follow-up, shoulder pain  HPI:  [name] is a 29 y.o. with medical history as below presents for hospitalization follow-up appointment.  Please see problem-based list for further evaluation and detailed planning.  Past Medical History:  Diagnosis Date  . 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 Depression   . Lipoma of neck 04/2017   posterior  . Scoliosis    idiopathic   . Sepsis ([place])   . WPW (Wolff-Parkinson-White syndrome)    Review of Systems:  As per HPI  Physical Exam:  Vitals:   11/11/19 1038  BP: (!) 140/96  Pulse: (!) 140  Temp: 97.7 F (36.5 C)  SpO2: 100%  Weight: 119 lb 3.2 oz (54.1 kg)   General: Anxious-appearing, no acute distress CV: Tachycardic, regular rhythm. No murmurs, gallops, rubs. MSK: R shoulder non-erythematous. R shoulder active ROM markedly limited.  Assessment & Plan:   See Encounters Tab for problem based charting.  Patient seen with Dr. Dareen Piano

## 2019-11-11 NOTE — Assessment & Plan Note (Signed)
Seems to be doing okay off Lexapro due to interaction with linezolid.

## 2019-11-11 NOTE — Assessment & Plan Note (Addendum)
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 She mentions she wants to be finished with opioids and is motivated to cease use. Discussed that we will need to treat infection and have consult with surgery prior to starting opioid use disorder treatment. She is agreeable to plan.

## 2019-11-11 NOTE — Assessment & Plan Note (Signed)
No fevers or chills, given burden of infection however and complaints she voiced to me today we will have her come for repeat blood cultures today.  If positive we will recommend readmission through the ER.

## 2019-11-11 NOTE — Patient Instructions (Addendum)
[name],  It was a pleasure seeing you today!  We discussed your recent hospitalization and right shoulder pain. We are prescribing more pain medicine for the next few days before you see the orthopedic surgeons. Please make sure to go to your MRI appointment and the appointment at the orthopedic surgeon's office. If your heart rate continues to be high or you develop fevers, please go to the Emergency Department.  We look forward to seeing you next time. Please call our clinic at [phone] if you have any questions or concerns. The best time to call is Monday-Friday from 9am-4pm, but there is someone available 24/7 at the same number. If you need medication refills, please notify your pharmacy one week in advance and they will send [place] a request.  Thank you for letting us take part in your care. Wishing you the best!  Thank you, Dr. Sanjuan Dame, MD

## 2019-11-12 NOTE — Progress Notes (Signed)
Internal Medicine Clinic Attending  I saw and evaluated the patient.  I personally confirmed the key portions of the history and exam documented by Dr. Collene Gobble and I reviewed pertinent patient test results.  The assessment, diagnosis, and plan were formulated together and I agree with the documentation in the resident's note.  Patient noted to have normal passive range of motion but limited active range secondary to pain. No erythema or tenderness noted over R shoulder. However, given worsening pain, tachycardia and limited motion there is concern for possible septic arthritis especially in the setting of recent MSSA bacteremia and tricuspid endocarditis. We recommended admission for possible septic shoulder but patient refused. She is scheduled to be seen by ortho next week and obtain an MRI next week. Will follow up results.

## 2019-11-12 NOTE — Telephone Encounter (Signed)
Pt was seen yesterday 11/11/19 by Dr Collene Gobble.

## 2019-11-12 NOTE — Addendum Note (Signed)
Addended by: Aldine Contes on: 11/12/2019 11:33 AM   Modules accepted: Level of Service

## 2019-11-15 LAB — CULTURE, BLOOD (SINGLE)
MICRO NUMBER:: 11005435
MICRO NUMBER:: 11005436
SPECIMEN QUALITY:: ADEQUATE

## 2019-11-15 LAB — CBC WITH DIFFERENTIAL/PLATELET
Absolute Monocytes: 289 cells/uL (ref 200–950)
Basophils Absolute: 89 cells/uL (ref 0–200)
Basophils Relative: 1.2 %
Eosinophils Absolute: 59 cells/uL (ref 15–500)
Eosinophils Relative: 0.8 %
HCT: 28.3 % — ABNORMAL LOW (ref 35.0–45.0)
Hemoglobin: 9.3 g/dL — ABNORMAL LOW (ref 11.7–15.5)
Lymphs Abs: 2353 cells/uL (ref 850–3900)
MCH: 29.1 pg (ref 27.0–33.0)
MCHC: 32.9 g/dL (ref 32.0–36.0)
MCV: 88.4 fL (ref 80.0–100.0)
MPV: 8.8 fL (ref 7.5–12.5)
Monocytes Relative: 3.9 %
Neutro Abs: 4610 cells/uL (ref 1500–7800)
Neutrophils Relative %: 62.3 %
Platelets: 623 10*3/uL — ABNORMAL HIGH (ref 140–400)
RBC: 3.2 10*6/uL — ABNORMAL LOW (ref 3.80–5.10)
RDW: 13.5 % (ref 11.0–15.0)
Total Lymphocyte: 31.8 %
WBC: 7.4 10*3/uL (ref 3.8–10.8)

## 2019-11-15 LAB — C-REACTIVE PROTEIN: CRP: 18.4 mg/L — ABNORMAL HIGH (ref <8.0)

## 2019-11-15 LAB — SEDIMENTATION RATE: Sed Rate: >130 mm/h — ABNORMAL HIGH (ref 0–20)

## 2019-11-17 ENCOUNTER — Ambulatory Visit (HOSPITAL_COMMUNITY)
Admission: RE | Admit: 2019-11-17 | Discharge: 2019-11-17 | Disposition: A | Discharge status: Home / Self Care | Payer: Self-pay | Source: Ambulatory Visit | Attending: Infectious Diseases | Admitting: Infectious Diseases

## 2019-11-17 ENCOUNTER — Other Ambulatory Visit: Payer: Self-pay

## 2019-11-17 DIAGNOSIS — G8929 Other chronic pain: Secondary | ICD-10-CM | POA: Insufficient documentation

## 2019-11-17 DIAGNOSIS — M25511 Pain in right shoulder: Secondary | ICD-10-CM | POA: Insufficient documentation

## 2019-11-17 MED ORDER — GADOBUTROL 1 MMOL/ML IV SOLN
5.0000 mL | Freq: Once | INTRAVENOUS | Status: AC | PRN
  Administered 2019-11-17: 5 mL via INTRAVENOUS

## 2019-11-18 ENCOUNTER — Encounter: Payer: Self-pay | Admitting: Orthopaedic Surgery

## 2019-11-18 ENCOUNTER — Ambulatory Visit (INDEPENDENT_AMBULATORY_CARE_PROVIDER_SITE_OTHER): Payer: Medicaid Other | Admitting: Orthopaedic Surgery

## 2019-11-18 DIAGNOSIS — M00011 Staphylococcal arthritis, right shoulder: Secondary | ICD-10-CM

## 2019-11-18 DIAGNOSIS — M009 Pyogenic arthritis, unspecified: Secondary | ICD-10-CM | POA: Insufficient documentation

## 2019-11-18 DIAGNOSIS — M75121 Complete rotator cuff tear or rupture of right shoulder, not specified as traumatic: Secondary | ICD-10-CM

## 2019-11-18 NOTE — Progress Notes (Signed)
If Dr Marlou Sa could prioritize a cell count on any fluid he does get to that may be more helpful than a culture given 5 weeks of antibiotics (and high degree of concern it will be sterile).  I do strongly suspect it is ongoing infection given recent bacteremia and very elevated inflammatory markers.   Thanks for the time spent seeing her. Appreciate your help.   Colletta [place]

## 2019-11-18 NOTE — Progress Notes (Signed)
Office Visit Note   Patient: Suzanne [name]           Date of Birth: 05/30/1990           MRN: [id number] Visit Date: 11/18/2019              Requested by: Ellsworth Callas, NP [place],  [place] [id number] PCP: Mancel Bale, PA-C   Assessment & Plan: Visit Diagnoses:  1. Nontraumatic complete tear of right rotator cuff   2. Staphylococcal arthritis of right shoulder [facility])     Plan: [name] has been experiencing right shoulder pain approximately 5 weeks.  She admits to IV drug use with development of bacteremia and tricuspid valve growth.  Blood cultures were positive for MSSA.  She was treated for 2 weeks in the hospital with IV antibiotics and has been on Zyvox 600 mg twice a day for approximately 3 to 4 weeks.  She has been under the care of the infectious disease team at [facility].  She had an MRI scan of her shoulder today with evidence of a rotator cuff tear involving both supra and infraspinatus tendons with retraction of approximately 22 and 24 mm respectively.  There was also evidence of synovial enhancement worrisome for septic arthritis and a small amount of inflammatory debris in the joint.  No labral tears.  There was concern for abnormal area of enhancement in the humeral head suspicious for osteomyelitis.  She is not had any fever although she has had some night sweats.  She is having difficulty raising her arm over her head.  There has not been any prior aspiration of her shoulder.  I attempted an anterior aspiration today but did not retrieve any fluid.  Long discussion over about an hour with [name] regarding her present situation.  I think we need to determine that there is not an active infection and probably best way to determine that would be an ultrasound guidance of needle into the shoulder joint.  I have made an appointment for to see Dr. Marlou Sa in the morning.  I suspect if there is any fluid is going to be sterile as she has been on antibiotics for  5weeks.  She will continue with the p.o. antibiotics for the osteomyelitis.  I spoke with Janene Madeira nurse practitioner in the ID department this afternoon regarding all of the above.  I think there are basically 2 issues at present.  One is the potential for active infection.  She might be a candidate for an arthroscopic lavage pending repeat shoulder aspiration with ultrasound.  The other issue is a rotator cuff tear repair.  Dr. Marlou Sa can address both of those tomorrow and will discuss with him by phone.  Also discussed all the above with [name] that she did not have any further questions.  I placed her in a sling she is not presently taking drugs  Follow-Up Instructions: Return Appointment with Dr. Marlou Sa tomorrow morning.   Orders:  No orders of the defined types were placed in this encounter.  No orders of the defined types were placed in this encounter.     Procedures: No procedures performed   Clinical Data: No additional findings.   Subjective: Chief Complaint  Patient presents with  . Right Shoulder - Pain  Patient presents today for right shoulder pain. She said that it has been hurting for over a month. No known injury.Her pain is located all throughout her shoulder and into  her proximal arm. She said that she cannot lift her arm. She is right hand dominant. She is not currently taking anything for pain. She was admitted to the hospital for endocarditis last month, and the pain in her shoulder started one week before that.  Patient relates doing IV drugs about 6 weeks ago.  She developed a bacteremia and an infected tricuspid valve.  She was treated with 2 weeks of IV antibiotics through the infectious disease department at [facility] and presently is on p.o. antibiotics (Zyvox 600 mg twice daily.  She has had some night sweats but no fever or chills.  She denies any numbness or tingling in her right upper extremity.  She is having difficulty raising her arm over her head.  She  has had an MRI scan performed this morning of the right shoulder with results outlined as above  HPI  Review of Systems   Objective: Vital Signs: Ht 5\' 6"  (1.676 m)   Wt 120 lb (54.4 kg)   BMI 19.37 kg/m   Physical Exam Constitutional:      Appearance: She is well-developed.  Eyes:     Pupils: Pupils are equal, round, and reactive to light.  Pulmonary:     Effort: Pulmonary effort is normal.  Skin:    General: Skin is warm and dry.  Neurological:     Mental Status: She is alert and oriented to person, place, and time.  Psychiatric:        Behavior: Behavior normal.     Ortho Exam right shoulder was larger than the left.  It was not hot or warm nor was it red.  No obvious fluctuance.  A little bit of tenderness along the anterior subacromial region.  No pain at the [facility] joint.  Biceps appears to be intact.  Little bit of crepitation with internal/external rotation which could be the rotator cuff tear outlined by the MRI scan.  She could place her arm over her head but it was weak.  She had reasonable strength with internal and external rotation with her arm at her side. Specialty Comments:  No specialty comments available.  Imaging: MR SHOULDER RIGHT W WO CONTRAST  Result Date: 11/18/2019 CLINICAL DATA:  One month history of right shoulder pain. History of MRSA infection and septic endocarditis. Findings worrisome for right shoulder infection. EXAM: MRI OF THE RIGHT SHOULDER WITHOUT AND WITH CONTRAST TECHNIQUE: Multiplanar, multisequence MR imaging of the right shoulder was performed before and after the administration of intravenous contrast. CONTRAST:  68mL GADAVIST GADOBUTROL 1 MMOL/ML IV SOLN COMPARISON:  None. FINDINGS: Rotator cuff: Full-thickness retracted rotator cuff tear. Maximum retraction of the supraspinatus tendon is 22 mm. The infraspinatus tendon is retracted 24 mm. The subscapularis tendon is still intact and the teres minor tendon is intact. Muscles: Edema and  enhancement in the anterior and lateral deltoid muscles suggesting myositis. No findings for pyomyositis. Biceps long head:  Intact Acromioclavicular Joint: No significant degenerative changes. I do not see any findings suspicious for septic arthritis at the joint. Type 2 acromion. No lateral downsloping or undersurface spurring. Abnormal T2 signal intensity in the acromion and subsequent irregular enhancement worrisome for osteomyelitis. Glenohumeral Joint: Moderate-sized joint effusion with thick synovial enhancement worrisome for septic arthritis. Small amount of inflammatory debris noted in the joint. Labrum:  No definite labral tears. Bones: Abnormal T1 and T2 signal intensity and abnormal areas of enhancement in the humeral head suspicious for osteomyelitis. Do not see any definite findings for osteomyelitis involving the  glenoid. Other: Large complex rim enhancing fluid collection in the subacromial/subdeltoid bursa and to a lesser extent in the subcoracoid bursa consistent with septic bursitis. IMPRESSION: 1. Full-thickness retracted rotator cuff tear likely due to infectious tendinopathy. 2. Findings consistent with septic arthritis involving the glenohumeral joint. There is also osteomyelitis involving the humeral head and the acromion. 3. Septic bursitis involving the subacromial/subdeltoid bursa and also to a lesser extent the subcoracoid bursa. 4. Myositis involving the anterior and lateral deltoid muscles. No findings for pyomyositis. These results will be called to the ordering clinician or representative by the Radiologist Assistant, and communication documented in the PACS or [employer]. Electronically Signed   By: Marijo Sanes M.D.   On: 11/18/2019 09:11     PMFS History: Patient Active Problem List   Diagnosis Date Noted  . Complete tear of right rotator cuff 11/18/2019  . Septic arthritis of shoulder, right ([place]) 11/18/2019  . Opioid use disorder 11/11/2019  . GI bleed 10/25/2019   . Acute blood loss anemia 10/25/2019  . Gastric ulcer with hemorrhage   . 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 MSSA bacteremia 10/20/2019  . Right shoulder pain 10/20/2019  . Acute septic pulmonary embolism ([place]) 10/20/2019  . Irritability 03/29/2016  . Sinus tachycardia 08/07/2015  . Shortness of breath 08/07/2015  . SVT (supraventricular tachycardia) ([place]) 05/17/2015  . Major depression 05/03/2015  . Wolff-Parkinson-White syndrome 05/18/2014  . Smoker 05/18/2014  . ADD (attention deficit disorder) 08/18/2013   Past Medical History:  Diagnosis Date  . 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 Depression   . Lipoma of neck 04/2017   posterior  . Scoliosis    idiopathic   . Sepsis ([place])   . WPW (Wolff-Parkinson-White syndrome)     Family History  Problem Relation Age of Onset  . Other Brother        substance abuse  . Hyperlipidemia Maternal Grandfather   . Anxiety disorder Maternal Grandfather   . Alcoholism Maternal Grandfather   . Anxiety disorder Mother   . ADD / ADHD Father   . Drug abuse Father   . ADD / ADHD Brother   . Other Brother        bone marrow transplant  . Breast cancer Paternal Grandmother   . Aplastic anemia Brother   . Hypertension Maternal Grandmother   . Alcoholism Maternal Grandmother   . Diabetes Paternal Grandfather   . Cancer Paternal Grandfather   . Arrhythmia Brother   . Alcoholism Other   . Alcoholism Maternal Uncle     Past Surgical History:  Procedure Laterality Date  . BIOPSY  10/23/2019   Procedure: BIOPSY;  Surgeon: Juanita Craver, MD;  Location: [facility] ENDOSCOPY;  Service: Endoscopy;;  . COLPOSCOPY  01/2010 & 02/2011    both were negative   . ELECTROPHYSIOLOGIC STUDY N/A 05/17/2015   SVT ablation by Dr Curt Bears  . ESOPHAGOGASTRODUODENOSCOPY (EGD) WITH PROPOFOL N/A 10/23/2019   Procedure: ESOPHAGOGASTRODUODENOSCOPY (EGD) WITH PROPOFOL;  Surgeon: Juanita Craver, MD;  Location: [facility] ENDOSCOPY;  Service:  Endoscopy;  Laterality: N/A;  . LIPOMA EXCISION N/A 05/07/2017   Procedure: EXCISION LIPOMA POSTERIOR NECK ERAS PATHWAY;  Surgeon: Erroll Luna, MD;  Location: [place];  Service: General;  Laterality: N/A;  . TEE WITHOUT CARDIOVERSION N/A 10/22/2019   Procedure: TRANSESOPHAGEAL ECHOCARDIOGRAM (TEE);  Surgeon: Jerline Pain, MD;  Location: [facility] ENDOSCOPY;  Service: Cardiovascular;  Laterality: N/A;  . WISDOM TOOTH EXTRACTION     Social History  Occupational History  . Not on file  Tobacco Use  . Smoking status: Current Every Day Smoker    Packs/day: 0.00    Years: 7.00    Pack years: 0.00    Types: Cigarettes  . Smokeless tobacco: Never Used  . Tobacco comment: vapes daily   Vaping Use  . Vaping Use: Every day  Substance and Sexual Activity  . Alcohol use: Not Currently    Alcohol/week: 1.0 standard drink    Types: 1 Glasses of wine per week  . Drug use: Not Currently    Types: Marijuana, IV, Heroin    Comment: last used 04/13/2017  . Sexual activity: Yes    Partners: Male    Birth control/protection: None

## 2019-11-19 ENCOUNTER — Encounter: Payer: Self-pay | Admitting: Orthopedic Surgery

## 2019-11-19 ENCOUNTER — Ambulatory Visit (INDEPENDENT_AMBULATORY_CARE_PROVIDER_SITE_OTHER): Payer: Self-pay | Admitting: Orthopedic Surgery

## 2019-11-19 ENCOUNTER — Encounter (HOSPITAL_COMMUNITY)
Admission: RE | Disposition: A | Discharge status: Home / Self Care | Payer: Self-pay | Source: Home / Self Care | Attending: Orthopedic Surgery

## 2019-11-19 ENCOUNTER — Ambulatory Visit (HOSPITAL_COMMUNITY): Payer: Self-pay | Admitting: Anesthesiology

## 2019-11-19 ENCOUNTER — Inpatient Hospital Stay (HOSPITAL_COMMUNITY)
Admission: RE | Admit: 2019-11-19 | Discharge: 2019-11-21 | DRG: 501 | Disposition: A | Discharge status: Home / Self Care | Payer: Self-pay | Attending: Orthopedic Surgery | Admitting: Orthopedic Surgery

## 2019-11-19 ENCOUNTER — Other Ambulatory Visit: Payer: Self-pay

## 2019-11-19 ENCOUNTER — Encounter (HOSPITAL_COMMUNITY): Payer: Self-pay | Admitting: Orthopedic Surgery

## 2019-11-19 DIAGNOSIS — Z813 Family history of other psychoactive substance abuse and dependence: Secondary | ICD-10-CM

## 2019-11-19 DIAGNOSIS — Z83438 Family history of other disorder of lipoprotein metabolism and other lipidemia: Secondary | ICD-10-CM

## 2019-11-19 DIAGNOSIS — Z8249 Family history of ischemic heart disease and other diseases of the circulatory system: Secondary | ICD-10-CM

## 2019-11-19 DIAGNOSIS — Z8614 Personal history of Methicillin resistant Staphylococcus aureus infection: Secondary | ICD-10-CM

## 2019-11-19 DIAGNOSIS — F419 Anxiety disorder, unspecified: Secondary | ICD-10-CM | POA: Diagnosis present

## 2019-11-19 DIAGNOSIS — M419 Scoliosis, unspecified: Secondary | ICD-10-CM | POA: Diagnosis present

## 2019-11-19 DIAGNOSIS — I456 Pre-excitation syndrome: Secondary | ICD-10-CM | POA: Diagnosis present

## 2019-11-19 DIAGNOSIS — Z20822 Contact with and (suspected) exposure to covid-19: Secondary | ICD-10-CM | POA: Diagnosis present

## 2019-11-19 DIAGNOSIS — F1910 Other psychoactive substance abuse, uncomplicated: Secondary | ICD-10-CM | POA: Diagnosis present

## 2019-11-19 DIAGNOSIS — M009 Pyogenic arthritis, unspecified: Secondary | ICD-10-CM | POA: Diagnosis present

## 2019-11-19 DIAGNOSIS — Z833 Family history of diabetes mellitus: Secondary | ICD-10-CM

## 2019-11-19 DIAGNOSIS — M869 Osteomyelitis, unspecified: Secondary | ICD-10-CM | POA: Diagnosis present

## 2019-11-19 DIAGNOSIS — F988 Other specified behavioral and emotional disorders with onset usually occurring in childhood and adolescence: Secondary | ICD-10-CM | POA: Diagnosis present

## 2019-11-19 DIAGNOSIS — Z818 Family history of other mental and behavioral disorders: Secondary | ICD-10-CM

## 2019-11-19 DIAGNOSIS — Z8679 Personal history of other diseases of the circulatory system: Secondary | ICD-10-CM

## 2019-11-19 DIAGNOSIS — M00811 Arthritis due to other bacteria, right shoulder: Principal | ICD-10-CM | POA: Diagnosis present

## 2019-11-19 DIAGNOSIS — M75121 Complete rotator cuff tear or rupture of right shoulder, not specified as traumatic: Secondary | ICD-10-CM | POA: Diagnosis present

## 2019-11-19 DIAGNOSIS — F17290 Nicotine dependence, other tobacco product, uncomplicated: Secondary | ICD-10-CM | POA: Diagnosis present

## 2019-11-19 DIAGNOSIS — F329 Major depressive disorder, single episode, unspecified: Secondary | ICD-10-CM | POA: Diagnosis present

## 2019-11-19 DIAGNOSIS — F1020 Alcohol dependence, uncomplicated: Secondary | ICD-10-CM | POA: Diagnosis present

## 2019-11-19 HISTORY — PX: SHOULDER ARTHROSCOPY: SHX128

## 2019-11-19 HISTORY — DX: Other complications of anesthesia, initial encounter: T88.59XA

## 2019-11-19 HISTORY — DX: Nausea with vomiting, unspecified: R11.2

## 2019-11-19 HISTORY — DX: Other specified postprocedural states: Z98.890

## 2019-11-19 LAB — CBC
HCT: 27.3 % — ABNORMAL LOW (ref 36.0–46.0)
Hemoglobin: 8.5 g/dL — ABNORMAL LOW (ref 12.0–15.0)
MCH: 28.8 pg (ref 26.0–34.0)
MCHC: 31.1 g/dL (ref 30.0–36.0)
MCV: 92.5 fL (ref 80.0–100.0)
Platelets: 320 10*3/uL (ref 150–400)
RBC: 2.95 MIL/uL — ABNORMAL LOW (ref 3.87–5.11)
RDW: 14 % (ref 11.5–15.5)
WBC: 5.7 10*3/uL (ref 4.0–10.5)
nRBC: 0 % (ref 0.0–0.2)

## 2019-11-19 LAB — COMPREHENSIVE METABOLIC PANEL
ALT: 12 U/L (ref 0–44)
AST: 17 U/L (ref 15–41)
Albumin: 3.6 g/dL (ref 3.5–5.0)
Alkaline Phosphatase: 85 U/L (ref 38–126)
Anion gap: 11 (ref 5–15)
BUN: 11 mg/dL (ref 6–20)
CO2: 21 mmol/L — ABNORMAL LOW (ref 22–32)
Calcium: 9.1 mg/dL (ref 8.9–10.3)
Chloride: 106 mmol/L (ref 98–111)
Creatinine, Ser: 0.63 mg/dL (ref 0.44–1.00)
GFR, Estimated: >60 mL/min (ref >60)
Glucose, Bld: 91 mg/dL (ref 70–99)
Potassium: 4.2 mmol/L (ref 3.5–5.1)
Sodium: 138 mmol/L (ref 135–145)
Total Bilirubin: 0.5 mg/dL (ref 0.3–1.2)
Total Protein: 6.8 g/dL (ref 6.5–8.1)

## 2019-11-19 LAB — SARS CORONAVIRUS 2 BY RT PCR (HOSPITAL ORDER, PERFORMED IN ~~LOC~~ HOSPITAL LAB): SARS Coronavirus 2: NEGATIVE

## 2019-11-19 LAB — POCT PREGNANCY, URINE: Preg Test, Ur: NEGATIVE

## 2019-11-19 SURGERY — ARTHROSCOPY, SHOULDER
Anesthesia: General | Site: Shoulder | Laterality: Right

## 2019-11-19 MED ORDER — SODIUM CHLORIDE 0.9 % IV SOLN
2.0000 g | INTRAVENOUS | Status: DC
  Administered 2019-11-19 – 2019-11-20 (×2): 2 g via INTRAVENOUS
  Filled 2019-11-19: qty 2
  Filled 2019-11-19 (×3): qty 20

## 2019-11-19 MED ORDER — METHOCARBAMOL 500 MG PO TABS
500.0000 mg | ORAL_TABLET | Freq: Four times a day (QID) | ORAL | Status: DC | PRN
  Administered 2019-11-19 – 2019-11-21 (×6): 500 mg via ORAL
  Filled 2019-11-19 (×6): qty 1

## 2019-11-19 MED ORDER — LIDOCAINE 2% (20 MG/ML) 5 ML SYRINGE
INTRAMUSCULAR | Status: AC
  Filled 2019-11-19: qty 5

## 2019-11-19 MED ORDER — DEXAMETHASONE SODIUM PHOSPHATE 10 MG/ML IJ SOLN
INTRAMUSCULAR | Status: DC | PRN
  Administered 2019-11-19: 4 mg via INTRAVENOUS

## 2019-11-19 MED ORDER — GENTAMICIN SULFATE 40 MG/ML IJ SOLN
INTRAMUSCULAR | Status: AC
  Filled 2019-11-19: qty 2

## 2019-11-19 MED ORDER — METHOCARBAMOL 1000 MG/10ML IJ SOLN
500.0000 mg | Freq: Four times a day (QID) | INTRAVENOUS | Status: DC | PRN
  Filled 2019-11-19: qty 5

## 2019-11-19 MED ORDER — SODIUM CHLORIDE 0.9 % IR SOLN
Status: DC | PRN
  Administered 2019-11-19: 9000 mL
  Administered 2019-11-19: 3000 mL

## 2019-11-19 MED ORDER — MIDAZOLAM HCL 2 MG/2ML IJ SOLN
INTRAMUSCULAR | Status: AC
  Filled 2019-11-19: qty 2

## 2019-11-19 MED ORDER — DEXAMETHASONE SODIUM PHOSPHATE 10 MG/ML IJ SOLN
INTRAMUSCULAR | Status: AC
  Filled 2019-11-19: qty 1

## 2019-11-19 MED ORDER — ONDANSETRON HCL 4 MG/2ML IJ SOLN
INTRAMUSCULAR | Status: DC | PRN
  Administered 2019-11-19: 4 mg via INTRAVENOUS

## 2019-11-19 MED ORDER — PROPOFOL 10 MG/ML IV BOLUS
INTRAVENOUS | Status: DC | PRN
  Administered 2019-11-19: 150 mg via INTRAVENOUS

## 2019-11-19 MED ORDER — VANCOMYCIN HCL 500 MG IV SOLR
INTRAVENOUS | Status: DC | PRN
  Administered 2019-11-19: 500 mg

## 2019-11-19 MED ORDER — VANCOMYCIN HCL IN DEXTROSE 1-5 GM/200ML-% IV SOLN
1000.0000 mg | Freq: Two times a day (BID) | INTRAVENOUS | Status: AC
  Administered 2019-11-20: 1000 mg via INTRAVENOUS
  Filled 2019-11-19: qty 200

## 2019-11-19 MED ORDER — METOCLOPRAMIDE HCL 5 MG/ML IJ SOLN
5.0000 mg | Freq: Three times a day (TID) | INTRAMUSCULAR | Status: DC | PRN
  Administered 2019-11-21: 10 mg via INTRAVENOUS
  Filled 2019-11-19: qty 2

## 2019-11-19 MED ORDER — OXYCODONE HCL 5 MG PO TABS
5.0000 mg | ORAL_TABLET | ORAL | Status: DC | PRN
  Administered 2019-11-19 – 2019-11-21 (×7): 10 mg via ORAL
  Filled 2019-11-19 (×8): qty 2

## 2019-11-19 MED ORDER — VANCOMYCIN HCL IN DEXTROSE 1-5 GM/200ML-% IV SOLN
INTRAVENOUS | Status: AC
  Filled 2019-11-19: qty 200

## 2019-11-19 MED ORDER — LACTATED RINGERS IV SOLN: INTRAVENOUS | Status: DC | PRN

## 2019-11-19 MED ORDER — CHLORHEXIDINE GLUCONATE 4 % EX LIQD: 60.0000 mL | Freq: Once | CUTANEOUS | Status: DC

## 2019-11-19 MED ORDER — VANCOMYCIN HCL 500 MG IV SOLR
INTRAVENOUS | Status: AC
  Filled 2019-11-19: qty 500

## 2019-11-19 MED ORDER — ROPIVACAINE HCL 7.5 MG/ML IJ SOLN
INTRAMUSCULAR | Status: DC | PRN
  Administered 2019-11-19: 30 mL via PERINEURAL

## 2019-11-19 MED ORDER — ACETAMINOPHEN 325 MG PO TABS: 325.0000 mg | ORAL_TABLET | ORAL | Status: DC | PRN

## 2019-11-19 MED ORDER — PROPOFOL 10 MG/ML IV BOLUS
INTRAVENOUS | Status: AC
  Filled 2019-11-19: qty 20

## 2019-11-19 MED ORDER — HYDROMORPHONE HCL 1 MG/ML IJ SOLN
0.5000 mg | INTRAMUSCULAR | Status: DC | PRN
  Administered 2019-11-20 – 2019-11-21 (×7): 0.5 mg via INTRAVENOUS
  Filled 2019-11-19 (×9): qty 1

## 2019-11-19 MED ORDER — ONDANSETRON HCL 4 MG PO TABS: 4.0000 mg | ORAL_TABLET | Freq: Four times a day (QID) | ORAL | Status: DC | PRN

## 2019-11-19 MED ORDER — METOCLOPRAMIDE HCL 5 MG PO TABS: 5.0000 mg | ORAL_TABLET | Freq: Three times a day (TID) | ORAL | Status: DC | PRN

## 2019-11-19 MED ORDER — FENTANYL CITRATE (PF) 100 MCG/2ML IJ SOLN
INTRAMUSCULAR | Status: AC
Start: 2019-11-19 — End: 2019-11-19
  Administered 2019-11-19: 100 ug via INTRAVENOUS
  Filled 2019-11-19: qty 2

## 2019-11-19 MED ORDER — ACETAMINOPHEN 160 MG/5ML PO SOLN: 325.0000 mg | ORAL | Status: DC | PRN

## 2019-11-19 MED ORDER — POVIDONE-IODINE 10 % EX SWAB: 2.0000 "application " | Freq: Once | CUTANEOUS | Status: DC

## 2019-11-19 MED ORDER — GENTAMICIN SULFATE 40 MG/ML IJ SOLN
INTRAMUSCULAR | Status: AC
  Filled 2019-11-19: qty 4

## 2019-11-19 MED ORDER — ONDANSETRON HCL 4 MG/2ML IJ SOLN
INTRAMUSCULAR | Status: AC
  Filled 2019-11-19: qty 2

## 2019-11-19 MED ORDER — 0.9 % SODIUM CHLORIDE (POUR BTL) OPTIME
TOPICAL | Status: DC | PRN
  Administered 2019-11-19: 1000 mL

## 2019-11-19 MED ORDER — ROCURONIUM BROMIDE 10 MG/ML (PF) SYRINGE
PREFILLED_SYRINGE | INTRAVENOUS | Status: AC
  Filled 2019-11-19: qty 10

## 2019-11-19 MED ORDER — ONDANSETRON HCL 4 MG/2ML IJ SOLN
4.0000 mg | Freq: Four times a day (QID) | INTRAMUSCULAR | Status: DC | PRN
  Administered 2019-11-20 – 2019-11-21 (×2): 4 mg via INTRAVENOUS
  Filled 2019-11-19 (×2): qty 2

## 2019-11-19 MED ORDER — ROCURONIUM BROMIDE 10 MG/ML (PF) SYRINGE
PREFILLED_SYRINGE | INTRAVENOUS | Status: DC | PRN
  Administered 2019-11-19: 70 mg via INTRAVENOUS

## 2019-11-19 MED ORDER — SODIUM CHLORIDE 0.9 % IV SOLN: 1.0000 g | INTRAVENOUS | Status: DC

## 2019-11-19 MED ORDER — VANCOMYCIN HCL 1000 MG IV SOLR
INTRAVENOUS | Status: DC | PRN
  Administered 2019-11-19: 1000 mg via INTRAVENOUS

## 2019-11-19 MED ORDER — PHENYLEPHRINE 40 MCG/ML (10ML) SYRINGE FOR IV PUSH (FOR BLOOD PRESSURE SUPPORT)
PREFILLED_SYRINGE | INTRAVENOUS | Status: DC | PRN
  Administered 2019-11-19: 120 ug via INTRAVENOUS

## 2019-11-19 MED ORDER — MIDAZOLAM HCL 2 MG/2ML IJ SOLN: 4.0000 mg | Freq: Once | INTRAMUSCULAR | Status: AC

## 2019-11-19 MED ORDER — ACETAMINOPHEN 500 MG PO TABS
1000.0000 mg | ORAL_TABLET | Freq: Four times a day (QID) | ORAL | Status: AC
  Administered 2019-11-19 – 2019-11-20 (×3): 1000 mg via ORAL
  Filled 2019-11-19 (×4): qty 2

## 2019-11-19 MED ORDER — FENTANYL CITRATE (PF) 100 MCG/2ML IJ SOLN: 100.0000 ug | Freq: Once | INTRAMUSCULAR | Status: AC

## 2019-11-19 MED ORDER — PHENYLEPHRINE HCL-NACL 10-0.9 MG/250ML-% IV SOLN
INTRAVENOUS | Status: DC | PRN
  Administered 2019-11-19: 25 ug/min via INTRAVENOUS

## 2019-11-19 MED ORDER — DOCUSATE SODIUM 100 MG PO CAPS
100.0000 mg | ORAL_CAPSULE | Freq: Two times a day (BID) | ORAL | Status: DC
  Administered 2019-11-20 (×2): 100 mg via ORAL
  Filled 2019-11-19 (×4): qty 1

## 2019-11-19 MED ORDER — ONDANSETRON HCL 4 MG/2ML IJ SOLN: 4.0000 mg | Freq: Once | INTRAMUSCULAR | Status: DC | PRN

## 2019-11-19 MED ORDER — SUGAMMADEX SODIUM 200 MG/2ML IV SOLN
INTRAVENOUS | Status: DC | PRN
  Administered 2019-11-19: 110 mg via INTRAVENOUS

## 2019-11-19 MED ORDER — GENTAMICIN SULFATE 40 MG/ML IJ SOLN
INTRAMUSCULAR | Status: DC | PRN
  Administered 2019-11-19: 160 mg

## 2019-11-19 MED ORDER — FENTANYL CITRATE (PF) 250 MCG/5ML IJ SOLN
INTRAMUSCULAR | Status: AC
  Filled 2019-11-19: qty 5

## 2019-11-19 MED ORDER — MIDAZOLAM HCL 2 MG/2ML IJ SOLN
INTRAMUSCULAR | Status: AC
  Administered 2019-11-19: 4 mg via INTRAVENOUS
  Filled 2019-11-19: qty 4

## 2019-11-19 MED ORDER — PHENYLEPHRINE 40 MCG/ML (10ML) SYRINGE FOR IV PUSH (FOR BLOOD PRESSURE SUPPORT)
PREFILLED_SYRINGE | INTRAVENOUS | Status: AC
  Filled 2019-11-19: qty 10

## 2019-11-19 SURGICAL SUPPLY — 54 items
AID PSTN UNV HD RSTRNT DISP (MISCELLANEOUS) ×1
ALCOHOL 70% 16 OZ (MISCELLANEOUS) ×3 IMPLANT
BLADE EXCALIBUR 4.0MM X 13CM (MISCELLANEOUS) ×3
BLADE EXCALIBUR 4.0X13 (MISCELLANEOUS) ×4 IMPLANT
BLADE SURG 11 STRL SS (BLADE) ×3 IMPLANT
CANNULA TWIST IN 8.25X7CM (CANNULA) ×2 IMPLANT
COVER WAND RF STERILE (DRAPES) ×3 IMPLANT
DISSECTOR  3.8MM X 13CM (MISCELLANEOUS) ×3
DISSECTOR 3.8MM X 13CM (MISCELLANEOUS) IMPLANT
DRAPE IMP U-DRAPE 54X76 (DRAPES) ×3 IMPLANT
DRAPE INCISE IOBAN 66X45 STRL (DRAPES) ×3 IMPLANT
DRAPE STERI 35X30 U-POUCH (DRAPES) ×3 IMPLANT
DRAPE U-SHAPE 47X51 STRL (DRAPES) ×6 IMPLANT
DRSG AQUACEL AG ADV 3.5X 4 (GAUZE/BANDAGES/DRESSINGS) ×3 IMPLANT
DRSG AQUACEL AG ADV 3.5X 6 (GAUZE/BANDAGES/DRESSINGS) ×4 IMPLANT
DRSG TEGADERM 2-3/8X2-3/4 SM (GAUZE/BANDAGES/DRESSINGS) ×2 IMPLANT
DRSG TEGADERM 4X4.75 (GAUZE/BANDAGES/DRESSINGS) ×4 IMPLANT
DRSG XEROFORM 1X8 (GAUZE/BANDAGES/DRESSINGS) ×2 IMPLANT
DURAPREP 26ML APPLICATOR (WOUND CARE) ×3 IMPLANT
DW OUTFLOW CASSETTE/TUBE SET (MISCELLANEOUS) ×3 IMPLANT
ELECT REM PT RETURN 9FT ADLT (ELECTROSURGICAL) ×3
ELECTRODE REM PT RTRN 9FT ADLT (ELECTROSURGICAL) ×1 IMPLANT
EVACUATOR 1/8 PVC DRAIN (DRAIN) ×2 IMPLANT
GAUZE SPONGE 4X4 12PLY STRL (GAUZE/BANDAGES/DRESSINGS) ×2 IMPLANT
GAUZE XEROFORM 1X8 LF (GAUZE/BANDAGES/DRESSINGS) ×3 IMPLANT
GLOVE BIOGEL PI IND STRL 8 (GLOVE) ×1 IMPLANT
GLOVE BIOGEL PI INDICATOR 8 (GLOVE) ×2
GLOVE ECLIPSE 8.0 STRL XLNG CF (GLOVE) ×3 IMPLANT
GOWN STRL REUS W/ TWL LRG LVL3 (GOWN DISPOSABLE) ×3 IMPLANT
GOWN STRL REUS W/TWL LRG LVL3 (GOWN DISPOSABLE) ×9
KIT BASIN OR (CUSTOM PROCEDURE TRAY) ×3 IMPLANT
KIT STIMULAN 5CC (Orthopedic Implant) ×2 IMPLANT
KIT TURNOVER KIT B (KITS) ×3 IMPLANT
MANIFOLD NEPTUNE II (INSTRUMENTS) ×3 IMPLANT
NDL HYPO 25X1 1.5 SAFETY (NEEDLE) ×1 IMPLANT
NDL SPNL 18GX3.5 QUINCKE PK (NEEDLE) ×1 IMPLANT
NEEDLE HYPO 25X1 1.5 SAFETY (NEEDLE) ×3 IMPLANT
NEEDLE SPNL 18GX3.5 QUINCKE PK (NEEDLE) ×3 IMPLANT
NS IRRIG 1000ML POUR BTL (IV SOLUTION) ×3 IMPLANT
PACK SHOULDER (CUSTOM PROCEDURE TRAY) ×3 IMPLANT
PAD ARMBOARD 7.5X6 YLW CONV (MISCELLANEOUS) ×6 IMPLANT
RESTRAINT HEAD UNIVERSAL NS (MISCELLANEOUS) ×3 IMPLANT
SLING ARM FOAM STRAP LRG (SOFTGOODS) IMPLANT
SPONGE LAP 4X18 RFD (DISPOSABLE) ×3 IMPLANT
SUCTION FRAZIER HANDLE 10FR (MISCELLANEOUS)
SUCTION TUBE FRAZIER 10FR DISP (MISCELLANEOUS) IMPLANT
SUT ETHILON 3 0 PS 1 (SUTURE) ×5 IMPLANT
SUT VIC AB 2-0 CT1 27 (SUTURE) ×6
SUT VIC AB 2-0 CT1 TAPERPNT 27 (SUTURE) IMPLANT
TOWEL GREEN STERILE (TOWEL DISPOSABLE) ×3 IMPLANT
TOWEL GREEN STERILE FF (TOWEL DISPOSABLE) ×3 IMPLANT
TUBING ARTHROSCOPY IRRIG 16FT (MISCELLANEOUS) ×3 IMPLANT
WAND STAR VAC 90 (SURGICAL WAND) ×3 IMPLANT
WATER STERILE IRR 1000ML POUR (IV SOLUTION) ×3 IMPLANT

## 2019-11-19 NOTE — Anesthesia Procedure Notes (Signed)
Procedure Name: Intubation Date/Time: 11/19/2019 4:49 PM Performed by: Barrington Ellison, CRNA Pre-anesthesia Checklist: Patient identified, Emergency Drugs available, Suction available and Patient being monitored Patient Re-evaluated:Patient Re-evaluated prior to induction Oxygen Delivery Method: Circle System Utilized Preoxygenation: Pre-oxygenation with 100% oxygen Induction Type: IV induction Ventilation: Mask ventilation without difficulty Laryngoscope Size: Mac and 3 Grade View: Grade I Tube type: Oral Tube size: 7.0 mm Number of attempts: 1 Airway Equipment and Method: Stylet and Oral airway Placement Confirmation: ETT inserted through vocal cords under direct vision,  positive ETCO2 and breath sounds checked- equal and bilateral Secured at: 20 cm Tube secured with: Tape Dental Injury: Teeth and Oropharynx as per pre-operative assessment

## 2019-11-19 NOTE — H&P (View-Only) (Signed)
Office Visit Note   Patient: Suzanne [name]           Date of Birth: 12-23-1990           MRN: [id number] Visit Date: 11/19/2019 Requested by: Mancel Bale, PA-C [place],  [place] [id number] PCP: Mittie Bodo  Subjective: Chief Complaint  Patient presents with  . Right Shoulder - Pain    HPI: Suzanne [name] is a 29 year old patient seen yesterday by Dr. Durward Fortes.  She has a known diagnosis of endocarditis.  She is currently taking oral antibiotics for that.  Also has a known history of IV drug abuse.  Reports pain in the right shoulder for 1 month.  The pain wakes her from sleep at night.  She is currently not working.  Denies any history of trauma.  Does report some malaise.  Infection blood markers remained elevated despite adequate treatment of her endocarditis.  Subsequent MRI scan of the shoulder is reviewed by me today and I reviewed with the patient.  Does show phlegmon/fluid collection within the subacromial space and in the metaphyseal region of the humerus along with retracted rotator cuff tears of the supraspinatus and infraspinatus.              ROS: All systems reviewed are negative as they relate to the chief complaint within the history of present illness.  Patient denies  fevers or chills. Specifically no recent fevers or chills within the past 2 weeks.  Assessment & Plan: Visit Diagnoses:  1. Pyogenic arthritis of right shoulder region, due to unspecified organism [facility])     Plan: Impression is right shoulder septic arthritis with rotator cuff pathology and possible osteomyelitis at the rotator cuff attachment site.  Plan is arthroscopic debridement and washout with cultures obtained.  Infectious disease consult plan to advise on duration of antibiotic treatment.  Anticipate staying overnight in the hospital just so we can keep the drain in place in the shoulder for 12 to 18 hours.  The risk and benefits of surgery discussed include not limited to  infection nerve vessel damage incomplete healing as well as potential need for further surgery to treat the infection.  Also discussed with Suzanne [name] that rotator cuff surgery would need to be performed at sometime in the future but not really until the time where there is definitively no infection left in the shoulder.  This could be a bad outcome for her shoulder in terms of long-term function if the rotator cuff cannot be repaired.  Timing of that repair will have to be coordinated with infectious disease.  Follow-Up Instructions: No follow-ups on file.   Orders:  No orders of the defined types were placed in this encounter.  No orders of the defined types were placed in this encounter.     Procedures: No procedures performed   Clinical Data: No additional findings.  Objective: Vital Signs: There were no vitals taken for this visit.  Physical Exam:   Constitutional: Patient appears well-developed HEENT:  Head: Normocephalic Eyes:EOM are normal Neck: Normal range of motion Cardiovascular: Normal rate Pulmonary/chest: Effort normal Neurologic: Patient is alert Skin: Skin is warm Psychiatric: Patient has normal mood and affect    Ortho Exam: Ortho exam demonstrates weakness to infraspinatus supraspinatus testing on the right.  Slight warmth to the right shoulder.  Passively she has about 70 degrees of external rotation 15 degrees of abduction.  No lymphadenopathy around the shoulder joint.  Not much in the  way of coarse grinding or crepitus until we get to 90 degrees of abduction.  Subscap strength intact.  No Popeye deformity.  Specialty Comments:  No specialty comments available.  Imaging: No results found.   PMFS History: Patient Active Problem List   Diagnosis Date Noted  . Complete tear of right rotator cuff 11/18/2019  . Septic arthritis of shoulder, right ([place]) 11/18/2019  . Opioid use disorder 11/11/2019  . GI bleed 10/25/2019  . Acute blood loss anemia  10/25/2019  . Gastric ulcer with hemorrhage   . 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 MSSA bacteremia 10/20/2019  . Right shoulder pain 10/20/2019  . Acute septic pulmonary embolism ([place]) 10/20/2019  . Irritability 03/29/2016  . Sinus tachycardia 08/07/2015  . Shortness of breath 08/07/2015  . SVT (supraventricular tachycardia) ([place]) 05/17/2015  . Major depression 05/03/2015  . Wolff-Parkinson-White syndrome 05/18/2014  . Smoker 05/18/2014  . ADD (attention deficit disorder) 08/18/2013   Past Medical History:  Diagnosis Date  . 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 Depression   . Lipoma of neck 04/2017   posterior  . Scoliosis    idiopathic   . Sepsis ([place])   . WPW (Wolff-Parkinson-White syndrome)     Family History  Problem Relation Age of Onset  . Other Brother        substance abuse  . Hyperlipidemia Maternal Grandfather   . Anxiety disorder Maternal Grandfather   . Alcoholism Maternal Grandfather   . Anxiety disorder Mother   . ADD / ADHD Father   . Drug abuse Father   . ADD / ADHD Brother   . Other Brother        bone marrow transplant  . Breast cancer Paternal Grandmother   . Aplastic anemia Brother   . Hypertension Maternal Grandmother   . Alcoholism Maternal Grandmother   . Diabetes Paternal Grandfather   . Cancer Paternal Grandfather   . Arrhythmia Brother   . Alcoholism Other   . Alcoholism Maternal Uncle     Past Surgical History:  Procedure Laterality Date  . BIOPSY  10/23/2019   Procedure: BIOPSY;  Surgeon: Juanita Craver, MD;  Location: [facility] ENDOSCOPY;  Service: Endoscopy;;  . COLPOSCOPY  01/2010 & 02/2011    both were negative   . ELECTROPHYSIOLOGIC STUDY N/A 05/17/2015   SVT ablation by Dr Curt Bears  . ESOPHAGOGASTRODUODENOSCOPY (EGD) WITH PROPOFOL N/A 10/23/2019   Procedure: ESOPHAGOGASTRODUODENOSCOPY (EGD) WITH PROPOFOL;  Surgeon: Juanita Craver, MD;  Location: [facility] ENDOSCOPY;  Service: Endoscopy;  Laterality: N/A;  .  LIPOMA EXCISION N/A 05/07/2017   Procedure: EXCISION LIPOMA POSTERIOR NECK ERAS PATHWAY;  Surgeon: Erroll Luna, MD;  Location: [place];  Service: General;  Laterality: N/A;  . TEE WITHOUT CARDIOVERSION N/A 10/22/2019   Procedure: TRANSESOPHAGEAL ECHOCARDIOGRAM (TEE);  Surgeon: Jerline Pain, MD;  Location: [facility] ENDOSCOPY;  Service: Cardiovascular;  Laterality: N/A;  . WISDOM TOOTH EXTRACTION     Social History   Occupational History  . Not on file  Tobacco Use  . Smoking status: Current Every Day Smoker    Packs/day: 0.00    Years: 7.00    Pack years: 0.00    Types: Cigarettes  . Smokeless tobacco: Never Used  . Tobacco comment: vapes daily   Vaping Use  . Vaping Use: Every day  Substance and Sexual Activity  . Alcohol use: Not Currently    Alcohol/week: 1.0 standard drink    Types: 1 Glasses of wine per week  .  Drug use: Not Currently    Types: Marijuana, IV, Heroin    Comment: last used 04/13/2017  . Sexual activity: Yes    Partners: Male    Birth control/protection: None

## 2019-11-19 NOTE — Progress Notes (Signed)
@  2015, Patient came to Room 13. She is alert and oriented x4, S/P Right Shoulder Athroscopy, extensive Dedribement. Hemovac drain in place. Has c/o minimal pain and no c/o nausea. Voided x1, no foley cath noted. Minimal assistance needed. Will continue to close monitor patient.

## 2019-11-19 NOTE — Anesthesia Procedure Notes (Signed)
Anesthesia Regional Block: Interscalene brachial plexus block   Pre-Anesthetic Checklist: ,, timeout performed, Correct Patient, Correct Site, Correct Laterality, Correct Procedure, Correct Position, site marked, Risks and benefits discussed,  Surgical consent,  Pre-op evaluation,  At surgeon's request and post-op pain management  Laterality: Right  Prep: chloraprep       Needles:  Injection technique: Single-shot  Needle Type: Echogenic Stimulator Needle     Needle Length: 5cm  Needle Gauge: 22     Additional Needles:   Procedures:, nerve stimulator,,, ultrasound used (permanent image in chart),,,,   Nerve Stimulator or Paresthesia:  Response: hand, 0.45 mA,   Additional Responses:   Narrative:  Start time: 11/19/2019 4:10 PM End time: 11/19/2019 4:15 PM Injection made incrementally with aspirations every 5 mL.  Performed by: Personally  Anesthesiologist: Janeece Riggers, MD  Additional Notes: Functioning IV was confirmed and monitors were applied.  A 16mm 22ga Arrow echogenic stimulator needle was used. Sterile prep and drape,hand hygiene and sterile gloves were used. Ultrasound guidance: relevant anatomy identified, needle position confirmed, local anesthetic spread visualized around nerve(s)., vascular puncture avoided.  Image printed for medical record. Negative aspiration and negative test dose prior to incremental administration of local anesthetic. The patient tolerated the procedure well.

## 2019-11-19 NOTE — Anesthesia Postprocedure Evaluation (Signed)
Anesthesia Post Note  Patient: Suzanne [name]  Procedure(s) Performed: RIGHT SHOULDER ARTHROSCOPY, DEBRIDEMENT STIMULAN BEAD PLACEMENT (Right Shoulder)     Patient location during evaluation: PACU Anesthesia Type: General Level of consciousness: awake and alert Pain management: pain level controlled Vital Signs Assessment: post-procedure vital signs reviewed and stable Respiratory status: spontaneous breathing, nonlabored ventilation, respiratory function stable and patient connected to nasal cannula oxygen Cardiovascular status: blood pressure returned to baseline and stable Postop Assessment: no apparent nausea or vomiting Anesthetic complications: no   No complications documented.  Last Vitals:  Vitals:   11/19/19 2000 11/19/19 2033  BP:  120/77  Pulse: 87 89  Resp: 19 18  Temp:  36.6 C  SpO2: 100% 99%    Last Pain:  Vitals:   11/19/19 2259  PainSc: 7                  Gjon Letarte COKER

## 2019-11-19 NOTE — Interval H&P Note (Signed)
History and Physical Interval Note:  11/19/2019 4:35 PM  Suzanne [name]  has presented today for surgery, with the diagnosis of Right Shoulder Infection.  The various methods of treatment have been discussed with the patient and family. After consideration of risks, benefits and other options for treatment, the patient has consented to  Procedure(s): RIGHT SHOULDER ARTHROSCOPY, DEBRIDEMENT (Right) as a surgical intervention.  The patient's history has been reviewed, patient examined, no change in status, stable for surgery.  I have reviewed the patient's chart and labs.  Questions were answered to the patient's satisfaction.     Anderson [place]

## 2019-11-19 NOTE — Progress Notes (Signed)
Office Visit Note   Patient: Suzanne [name]           Date of Birth: 1990-10-05           MRN: [id number] Visit Date: 11/19/2019 Requested by: Mancel Bale, PA-C [address] [place],  [place] [address] PCP: Mittie Bodo  Subjective: Chief Complaint  Patient presents with   Right Shoulder - Pain    HPI: Mccranie is a 29 year old patient seen yesterday by Dr. Durward Fortes.  She has a known diagnosis of endocarditis.  She is currently taking oral antibiotics for that.  Also has a known history of IV drug abuse.  Reports pain in the right shoulder for 1 month.  The pain wakes her from sleep at night.  She is currently not working.  Denies any history of trauma.  Does report some malaise.  Infection blood markers remained elevated despite adequate treatment of her endocarditis.  Subsequent MRI scan of the shoulder is reviewed by me today and I reviewed with the patient.  Does show phlegmon/fluid collection within the subacromial space and in the metaphyseal region of the humerus along with retracted rotator cuff tears of the supraspinatus and infraspinatus.              ROS: All systems reviewed are negative as they relate to the chief complaint within the history of present illness.  Patient denies  fevers or chills. Specifically no recent fevers or chills within the past 2 weeks.  Assessment & Plan: Visit Diagnoses:  1. Pyogenic arthritis of right shoulder region, due to unspecified organism [facility])     Plan: Impression is right shoulder septic arthritis with rotator cuff pathology and possible osteomyelitis at the rotator cuff attachment site.  Plan is arthroscopic debridement and washout with cultures obtained.  Infectious disease consult plan to advise on duration of antibiotic treatment.  Anticipate staying overnight in the hospital just so we can keep the drain in place in the shoulder for 12 to 18 hours.  The risk and benefits of surgery discussed include not limited to  infection nerve vessel damage incomplete healing as well as potential need for further surgery to treat the infection.  Also discussed with Hendriks that rotator cuff surgery would need to be performed at sometime in the future but not really until the time where there is definitively no infection left in the shoulder.  This could be a bad outcome for her shoulder in terms of long-term function if the rotator cuff cannot be repaired.  Timing of that repair will have to be coordinated with infectious disease.  Follow-Up Instructions: No follow-ups on file.   Orders:  No orders of the defined types were placed in this encounter.  No orders of the defined types were placed in this encounter.     Procedures: No procedures performed   Clinical Data: No additional findings.  Objective: Vital Signs: There were no vitals taken for this visit.  Physical Exam:   Constitutional: Patient appears well-developed HEENT:  Head: Normocephalic Eyes:EOM are normal Neck: Normal range of motion Cardiovascular: Normal rate Pulmonary/chest: Effort normal Neurologic: Patient is alert Skin: Skin is warm Psychiatric: Patient has normal mood and affect    Ortho Exam: Ortho exam demonstrates weakness to infraspinatus supraspinatus testing on the right.  Slight warmth to the right shoulder.  Passively she has about 70 degrees of external rotation 15 degrees of abduction.  No lymphadenopathy around the shoulder joint.  Not much in the  way of coarse grinding or crepitus until we get to 90 degrees of abduction.  Subscap strength intact.  No Popeye deformity.  Specialty Comments:  No specialty comments available.  Imaging: No results found.   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   MSSA bacteremia 10/20/2019   Right shoulder pain 10/20/2019   Acute septic pulmonary embolism ([place]) 10/20/2019   Irritability 03/29/2016   Sinus tachycardia 08/07/2015   Shortness of breath 08/07/2015   SVT (supraventricular tachycardia) ([place]) 05/17/2015   Major depression 05/03/2015   Wolff-Parkinson-White syndrome 05/18/2014   Smoker 05/18/2014   ADD (attention deficit disorder) 08/18/2013   Past Medical History:  Diagnosis Date   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   Depression    Lipoma of neck 04/2017   posterior   Scoliosis    idiopathic    Sepsis ([place])    WPW (Wolff-Parkinson-White syndrome)     Family History  Problem Relation Age of Onset   Other Brother        substance abuse   Hyperlipidemia Maternal Grandfather    Anxiety disorder Maternal Grandfather    Alcoholism Maternal Grandfather    Anxiety disorder Mother    ADD / ADHD Father    Drug abuse Father    ADD / ADHD Brother    Other Brother        bone marrow transplant   Breast cancer Paternal Grandmother    Aplastic anemia Brother    Hypertension Maternal Grandmother    Alcoholism Maternal Grandmother    Diabetes Paternal Grandfather    Cancer Paternal Grandfather    Arrhythmia Brother    Alcoholism Other    Alcoholism Maternal Uncle     Past Surgical History:  Procedure Laterality Date   BIOPSY  10/23/2019   Procedure: BIOPSY;  Surgeon: Juanita Craver, MD;  Location: [facility] ENDOSCOPY;  Service: Endoscopy;;   COLPOSCOPY  01/2010 & 02/2011    both were negative    ELECTROPHYSIOLOGIC STUDY N/A 05/17/2015   SVT ablation by Dr Curt Bears   ESOPHAGOGASTRODUODENOSCOPY (EGD) WITH PROPOFOL N/A 10/23/2019   Procedure: ESOPHAGOGASTRODUODENOSCOPY (EGD) WITH PROPOFOL;  Surgeon: Juanita Craver, MD;  Location[facility] END[facility]COPY;  Service: Endoscopy;  Laterality: N/A;    LIPOMA EXCISION N/A 05/07/2017   Procedure: EXCISION LIPOMA POSTERIOR NECK ERAS PATHWAY;  Surgeon: Erroll Luna, MD;  Location: [facility];  Service: General;  Laterality: N/A;   TEE WITHOUT CARDIOVERSION N/A 10/22/2019   Procedure: TRANSESOPHAGEAL ECHOCARDIOGRAM (TEE);  Surgeon: Jerline Pain, MD;  Location: [facility] ENDOSCOPY;  Service: Cardiovascular;  Laterality: N/A;   WISDOM TOOTH EXTRACTION     Social History   Occupational History   Not on file  Tobacco Use   Smoking status: Current Every Day Smoker    Packs/day: 0.00    Years: 7.00    Pack years: 0.00    Types: Cigarettes   Smokeless tobacco: Never Used   Tobacco comment: vapes daily   Vaping Use   Vaping Use: Every day  Substance and Sexual Activity   Alcohol use: Not Currently    Alcohol/week: 1.0 standard drink    Types: 1 Glasses of wine per week  Drug use: Not Currently    Types: Marijuana, IV, Heroin    Comment: last used 04/13/2017   Sexual activity: Yes    Partners: Male    Birth control/protection: None

## 2019-11-19 NOTE — Progress Notes (Signed)
Debbie at Dr. Randel Pigg office made aware that patient has not arrived to the hospital.  Patient was contacted for a second time and stated that she would be here in 10 minutes.

## 2019-11-19 NOTE — Brief Op Note (Signed)
   11/19/2019  7:27 PM  PATIENT:  Suzanne [name]  29 y.o. female  PRE-OPERATIVE DIAGNOSIS:  Right Shoulder Infection  POST-OPERATIVE DIAGNOSIS:  Right Shoulder Infection  PROCEDURE:  Procedure(s): RIGHT SHOULDER ARTHROSCOPY, extensive  DEBRIDEMENT STIMULAN BEAD PLACEMENT  SURGEON:  Surgeon(s): Marlou Sa, Tonna Corner, MD  ASSISTANT: magnant pa  ANESTHESIA:   general  EBL: 25 ml    No intake/output data recorded.  BLOOD ADMINISTERED: none  DRAINS: Hemovac drain in the right shoulder   LOCAL MEDICATIONS USED: Stimulant beads in the shoulder and subacromial space  SPECIMEN: Cultures x1  COUNTS:  YES  TOURNIQUET:  * No tourniquets in log *  DICTATION: [id number] PLAN OF CARE: Admit for overnight observation  PATIENT DISPOSITION:  PACU - hemodynamically stable        Recent hospitalization for endocarditis

## 2019-11-19 NOTE — Transfer of Care (Signed)
Immediate Anesthesia Transfer of Care Note  Patient: Suzanne [name]  Procedure(s) Performed: RIGHT SHOULDER ARTHROSCOPY, DEBRIDEMENT STIMULAN BEAD PLACEMENT (Right Shoulder)  Patient Location: PACU  Anesthesia Type:General  Level of Consciousness: lethargic and responds to stimulation  Airway & Oxygen Therapy: Patient Spontanous Breathing  Post-op Assessment: Report given to RN  Post vital signs: Reviewed and stable  Last Vitals:  Vitals Value Taken Time  BP    Temp    Pulse    Resp 12 11/19/19 1906  SpO2    Vitals shown include unvalidated device data.  Last Pain: There were no vitals filed for this visit.       Complications: No complications documented.

## 2019-11-19 NOTE — Anesthesia Preprocedure Evaluation (Addendum)
Anesthesia Evaluation  Patient identified by MRN, date of birth, ID band Patient awake    Reviewed: Allergy & Precautions, H&P , NPO status , Patient's Chart, lab work & pertinent test results, reviewed documented beta blocker date and time   Airway Mallampati: I  TM Distance: >3 FB Neck ROM: full    Dental no notable dental hx. (+) Teeth Intact, Dental Advisory Given   Pulmonary neg pulmonary ROS, Current Smoker and Patient abstained from smoking.,    Pulmonary exam normal breath sounds clear to auscultation       Cardiovascular Exercise Tolerance: Good Normal cardiovascular exam+ dysrhythmias Supra Ventricular Tachycardia  Rhythm:regular Rate:Normal  TEE 9/21 Left Ventricle: Normal left ventricular ejection fraction 55 to 60%  Mitral Valve: Normal mitral valve, trivial mitral regurgitation  Aortic Valve: Normal aortic valve, no aortic regurgitation  Tricuspid Valve: There is a large 2 x 1.5 cm globular circumferential vegetation with a subsidiary elongated 2 cm mobile portion attached to the lateral aspect of the tricuspid valve apparatus cord in the right ventricle.  There is mild tricuspid regurgitation.   Neuro/Psych PSYCHIATRIC DISORDERS Depression negative neurological ROS     GI/Hepatic Neg liver ROS, PUD,   Endo/Other  negative endocrine ROS  Renal/GU negative Renal ROS  negative genitourinary   Musculoskeletal  (+) Arthritis , Osteoarthritis,    Abdominal   Peds  Hematology  (+) Blood dyscrasia, anemia ,   Anesthesia Other Findings   Reproductive/Obstetrics negative OB ROS                            Anesthesia Physical Anesthesia Plan  ASA: III  Anesthesia Plan: General   Post-op Pain Management: GA combined w/ Regional for post-op pain   Induction: Intravenous  PONV Risk Score and Plan: 3 and Ondansetron, Dexamethasone, Treatment may vary due to age or medical  condition and Midazolam  Airway Management Planned: Oral ETT and LMA  Additional Equipment:   Intra-op Plan:   Post-operative Plan: Extubation in OR  Informed Consent: I have reviewed the patients History and Physical, chart, labs and discussed the procedure including the risks, benefits and alternatives for the proposed anesthesia with the patient or authorized representative who has indicated his/her understanding and acceptance.     Dental Advisory Given  Plan Discussed with: CRNA and Anesthesiologist  Anesthesia Plan Comments: (  )        Anesthesia Quick Evaluation

## 2019-11-20 DIAGNOSIS — M00011 Staphylococcal arthritis, right shoulder: Secondary | ICD-10-CM

## 2019-11-20 NOTE — Op Note (Signed)
Suzanne [name], Suzanne [name] MEDICAL RECORD [id number] ACCOUNT [id number] DATE OF BIRTH:May 21, 1990 FACILITY: [facility] LOCATION: [facility] PHYSICIAN:Divine Hansley Randel Pigg, MD  OPERATIVE REPORT  DATE OF PROCEDURE:  11/19/2019  PREOPERATIVE DIAGNOSIS:  Right shoulder infection.  POSTOPERATIVE DIAGNOSIS:  Right shoulder infection.  PROCEDURE:  Right shoulder extensive debridement of both the subacromial space and the intraarticular joint with placement of Stimulan antibiotic beads.  SURGEON:  Meredith Pel, MD  ASSISTANT:  Annie Main, PA.  INDICATIONS:  This is a 29 year old patient with history of IV drug abuse, endocarditis and right shoulder infection who presents now for operative management.  MRI scan does show a collection of phlegmon within the subacromial space, as well as rotator  cuff tear in the shoulder.  She presents now for operative management after explanation of risks and benefits.  PROCEDURE IN DETAIL:  The patient was brought to the operating room where general anesthetic was induced.  Preoperative antibiotics administered.  Timeout was called.  The patient was placed in the beach chair position with the head in neutral position.   Right shoulder prescrubbed with alcohol and Betadine, allowed to air dry, prepped with DuraPrep solution and draped in sterile manner.  Ioban used to seal the operative field.  Timeout was called.  A solution of saline was used to inject the shoulder  joint through a posterior portal 2 cm inferior and medial to the posterolateral margin of acromion, portal was created.  Diagnostic arthroscopy was performed.  There was some inflammation within the shoulder joint.  There was some soft flimsy tissue,  which had filled in that the rotator cuff tear defect seen on the MRI scan.  This spanned between the infraspinatus, supraspinatus and the tuberosity.  The biceps tendon was intact.  Extensive debridement was performed with the Arthrocare wand and  shaver.  The  glenohumeral articular surfaces were intact.  Following extensive debridement within the joint using 6 L of irrigating fluid, Stimulan beads were placed within the joint.  These contained vancomycin and gentamicin.  Next, the scope was  placed into the subacromial space.  It should be noted the anterior portal was created to facilitate placement of the antibiotic beads.  Lateral portal created.  Extensive debridement was performed of that  phlegmon between the rotator cuff, bursa and  the fascia of the deltoid.  Care was taken to avoid injury to the axillary nerve.  Next, after extensive debridement, thorough irrigation was performed with another 9 L of irrigating solution and antibiotic beads been placed.  Hemovac drain was then  placed through that posterior portal.  The patient tolerated the procedure well without immediate complications.  Portals were closed using 2-0 Vicryl, 3-0 nylon.  Waterproof dressings applied.  The patient tolerated the procedure well without immediate  complications.  She was transferred to the recovery room in stable condition.  Infectious disease will consult on this patient to guide optimal antibiotic duration and type.  She did get 1 g of IV vancomycin after cultures were obtained.  Luke's  assistance was required at all times for retraction, opening, closing, limb positioning.  His assistance was a medical necessity.  VN/NUANCE  D:11/19/2019 T:11/20/2019 JOB:[id number]

## 2019-11-20 NOTE — Progress Notes (Signed)
Patient noted to have purple vape pen upon entry into room.  RN discussed hospital policy regarding vaping and took pen out of room.  Patient label placed on item and sent with boyfriend upon leaving.

## 2019-11-20 NOTE — Progress Notes (Signed)
  Subjective: Patient is 29 y.o. Female who is POD1 s/p right shoulder arthroscopic I&D with phlegmon debridement.  Patient notes that she is "doing okay".  She complains of shoulder pain primarily. Denies any fevers or chills subjectively.  Hemovac drain in place with little to no drainage.  Her block has worn off.   Objective: Vital signs in last 24 hours: Temp:  [97 F (36.1 C)-98.1 F (36.7 C)] 97.9 F (36.6 C) (10/09 0551) Pulse Rate:  [87-101] 101 (10/09 0551) Resp:  [13-23] 16 (10/09 0551) BP: (107-122)/(69-99) 114/84 (10/09 0551) SpO2:  [98 %-100 %] 98 % (10/09 0551)  Intake/Output from previous day: 10/08 0701 - 10/09 0700 In: 1172.2 [I.V.:800; IV Piggyback:372.2] Out: 100 [Blood:100] Intake/Output this shift: No intake/output data recorded.  Exam:  Dressings intact with hemovac drain in place.  Drain pulled and new dressing applied.  Finger abduction, EPL, wrist extension intact.  Sensation normal throughout right hand.    Labs: Recent Labs    11/19/19 1530  HGB 8.5*   Recent Labs    11/19/19 1530  WBC 5.7  RBC 2.95*  HCT 27.3*  PLT 320   Recent Labs    11/19/19 1530  NA 138  K 4.2  CL 106  CO2 21*  BUN 11  CREATININE 0.63  GLUCOSE 91  CALCIUM 9.1   No results for input(s): LABPT, INR in the last 72 hours.  Assessment/Plan: Patient is POD1 s/p right shoulder arthroscopic I&D.  Patient stable. Currently on Vancomycin and Ceftriaxone IV.  Awaiting evaluation by Infectious Disease, appreciate their input. Hemovac drain pulled and dressing replaced.  Patient has history of endocarditis and currently has rotator cuff damage that will likely need to be fixed in the future after infection resolved.     Wasim Hurlbut L Trent Gabler 11/20/2019, 8:41 AM

## 2019-11-20 NOTE — Consult Note (Signed)
  [facility]       Reason for Consult: septic arthritis and possible osteomyelitis   Referring Physician: Dr. Dean  Active Problems:   Septic arthritis of shoulder, right ([facility])   Infection of shoulder ([facility])   . acetaminophen  1,000 mg Oral Q6H  . docusate sodium  100 mg Oral BID    Recommendations: cefazolin for a prolonged course in a supervised setting  Assessment: She has known MSSA TV endocarditis and now with worsening right shoulder pain with MRI c/w septic arthritis and possible osteomyelitis.  OP report pending at this time but confirms shoulder infection.  Most likely c/w above MSSA and will continue with cefazolin and monitor cultures.   CRP 18.4, ESR > 130 on 9/28.   Dr. Van Dam will follow up on Monday  Antibiotics: Vancomycin and ceftriaxone  HPI: Suzanne [name] is a 29 y.o. female with MSSA TV endocarditis currently had been on linezolid for her course with worsening right shoulder pain.  Taken to the OR yesterday.  No growth on the culture to date. Has had no fever.  She was not eligible for an angiovac.  MRI done and c/w septic artrhtitis and osteomyelitis.  Now s/p debridement.  Previously did not agree to right shoulder MRI due to pain. Main complaint is pain.   Review of Systems:  Constitutional: negative for fevers and chills Gastrointestinal: negative for nausea and diarrhea Integument/breast: negative for rash All other systems reviewed and are negative    Past Medical History:  Diagnosis Date  . 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 Complication of anesthesia   . Depression   . Lipoma of neck 04/2017   posterior  . PONV (postoperative nausea and vomiting)   . Scoliosis    idiopathic   . Sepsis ([facility])   . WPW (Wolff-Parkinson-White syndrome)     Social History   Tobacco Use  . Smoking status: Former Smoker    Packs/day: 0.00    Years: 7.00    Pack years: 0.00    Types: Cigarettes  .  Smokeless tobacco: Never Used  . Tobacco comment: vapes daily   Vaping Use  . Vaping Use: Every day  Substance Use Topics  . Alcohol use: Not Currently    Alcohol/week: 1.0 standard drink    Types: 1 Glasses of wine per week  . Drug use: Not Currently    Types: Marijuana, IV, Heroin    Comment: last used 04/13/2017    Family History  Problem Relation Age of Onset  . Other Brother        substance abuse  . Hyperlipidemia Maternal Grandfather   . Anxiety disorder Maternal Grandfather   . Alcoholism Maternal Grandfather   . Anxiety disorder Mother   . ADD / ADHD Father   . Drug abuse Father   . ADD / ADHD Brother   . Other Brother        bone marrow transplant  . Breast cancer Paternal Grandmother   . Aplastic anemia Brother   . Hypertension Maternal Grandmother   . Alcoholism Maternal Grandmother   . Diabetes Paternal Grandfather   . Cancer Paternal Grandfather   . Arrhythmia Brother   . Alcoholism Other   . Alcoholism Maternal Uncle     No Known Allergies  Physical Exam: Constitutional: mild distress with pain Vitals:   11/20/19 0551 11/20/19 1123  BP: 114/84 (!) 131/94  Pulse: (!) 101 (!) 104  Resp: 16 18    Temp: 97.9 F (36.6 C) 98.3 F (36.8 C)  SpO2: 98% 99%   EYES: anicteric Cardiovascular: Cor RRR Respiratory: clear; Musculoskeletal: right shoulder with bandage, no erythema around it Skin: negatives: no rash  Lab Results  Component Value Date   WBC 5.7 11/19/2019   HGB 8.5 (L) 11/19/2019   HCT 27.3 (L) 11/19/2019   MCV 92.5 11/19/2019   PLT 320 11/19/2019    Lab Results  Component Value Date   CREATININE 0.63 11/19/2019   BUN 11 11/19/2019   NA 138 11/19/2019   K 4.2 11/19/2019   CL 106 11/19/2019   CO2 21 (L) 11/19/2019    Lab Results  Component Value Date   ALT 12 11/19/2019   AST 17 11/19/2019   ALKPHOS 85 11/19/2019     Microbiology: Recent Results (from the past 240 hour(s))  SARS Coronavirus 2 by RT PCR (hospital order,  performed in [place] hospital lab) Nasopharyngeal Nasopharyngeal Swab     Status: None   Collection Time: 11/19/19  2:27 PM   Specimen: Nasopharyngeal Swab  Result Value Ref Range Status   SARS Coronavirus 2 NEGATIVE NEGATIVE Final    Comment: (NOTE) SARS-CoV-2 target nucleic acids are NOT DETECTED.  The SARS-CoV-2 RNA is generally detectable in upper and lower respiratory specimens during the acute phase of infection. The lowest concentration of SARS-CoV-2 viral copies this assay can detect is 250 copies / mL. A negative result does not preclude SARS-CoV-2 infection and should not be used as the sole basis for treatment or other patient management decisions.  A negative result may occur with improper specimen collection / handling, submission of specimen other than nasopharyngeal swab, presence of viral mutation(s) within the areas targeted by this assay, and inadequate number of viral copies (<250 copies / mL). A negative result must be combined with clinical observations, patient history, and epidemiological information.  Fact Sheet for Patients:   https://www.fda.gov/media/136312/download  Fact Sheet for Healthcare Providers: https://www.fda.gov/media/136313/download  This test is not yet approved or  cleared by the [place] FDA and has been authorized for detection and/or diagnosis of SARS-CoV-2 by FDA under an Emergency Use Authorization (EUA).  This EUA will remain in effect (meaning this test can be used) for the duration of the COVID-19 declaration under Section 564(b)(1) of the Act, 21 U.S.C. section 360bbb-3(b)(1), unless the authorization is terminated or revoked sooner.  Performed at [facility], [address] [place], [place] [address]   Aerobic/Anaerobic Culture (surgical/deep wound)     Status: None (Preliminary result)   Collection Time: 11/19/19  6:01 PM   Specimen: PATH Other; Tissue  Result Value Ref Range Status   Specimen Description  WOUND RIGHT SHOULDER  Final   Special Requests NONE  Final   Gram Stain NO WBC SEEN NO ORGANISMS SEEN   Final   Culture   Final    NO GROWTH < 24 HOURS Performed at [facility], [address] [place], [place] [address]    Report Status PENDING  Incomplete    Robert W Comer, MD [email or web address] 11/20/2019, 1:26 PM 

## 2019-11-20 NOTE — Progress Notes (Signed)
Reinforced dressing to R shoulder 2x, 3soaked gauzed with sanguineous drainage, no output noted to Hemovac drain

## 2019-11-21 MED ORDER — METHOCARBAMOL 500 MG PO TABS: 500.0000 mg | ORAL_TABLET | Freq: Four times a day (QID) | ORAL | 0 refills | Status: DC | PRN

## 2019-11-21 MED ORDER — KETOROLAC TROMETHAMINE 10 MG PO TABS
10.0000 mg | ORAL_TABLET | Freq: Four times a day (QID) | ORAL | Status: DC
  Administered 2019-11-21: 10 mg via ORAL
  Filled 2019-11-21 (×2): qty 1

## 2019-11-21 MED ORDER — KETOROLAC TROMETHAMINE 10 MG PO TABS
10.0000 mg | ORAL_TABLET | Freq: Four times a day (QID) | ORAL | 0 refills | Status: DC
Start: 2019-11-21 — End: 2019-11-30

## 2019-11-21 MED ORDER — KETOROLAC TROMETHAMINE 30 MG/ML IJ SOLN
30.0000 mg | Freq: Once | INTRAMUSCULAR | Status: AC
  Administered 2019-11-21: 30 mg via INTRAVENOUS
  Filled 2019-11-21: qty 1

## 2019-11-21 NOTE — Progress Notes (Signed)
RN noted several prescription bottles in black purse on floor.  When RN asked what medications that the patient had and informed the patient that she could not have any home medications in her room per policy, the patient stated they were her antibiotics from her last hospitalization.  Patient declined to give medications to the RN as instructed and declined RN to look into bag.  Patient stated "I know not to take them."

## 2019-11-21 NOTE — Progress Notes (Signed)
    [facility]   Reason for visit: Follow up on septic arthritis  Interval History: threatening to staff, multiple narcotics found in room.    Physical Exam: Constitutional:  Vitals:   11/21/19 0609 11/21/19 0745  BP: (!) 133/93 138/88  Pulse: 100 97  Resp:  16  Temp: 98.5 F (36.9 C) 98.6 F (37 C)  SpO2: 99% 99%   patient appears in NAD  Impression: septic arthritis.  I have explained to the patient that she would benefit from IV therapy but refusing to stay inpatient  Plan: 1.  She will continue her home linezolid.

## 2019-11-21 NOTE — Progress Notes (Signed)
Spoke to Dr Linus Salmons and relayed patient condition update and behaviors.  Per Dr. Linus Salmons the patient is to continue to take her [place] that was prescribed at last admission and she is okay to discharge.  MD requested that Ortho write discharge orders.  RN will contact ortho MD for discharge instructions.

## 2019-11-21 NOTE — Progress Notes (Signed)
Nurse called to patient's room.  Patient was crying very loud.  Patient reports that she is in a lot of pain. Reports pain level is a 10 at this time.   Patient assisted to reposition in the bed.  Dr. Ninfa Linden on-call MD for Orthopedic Surgery called and informed of situation.  New orders given.

## 2019-11-21 NOTE — Progress Notes (Signed)
Patient's boyfriend's mother is contacting unit and harassing staff for information regarding the patient and visitation restrictions.  Caller became verbally aggressive with staff after multiple, repeated explanations as to HIPPA and to contact patient directly.

## 2019-11-21 NOTE — Progress Notes (Signed)
RN discussed with charge nurse Arville Go the finding of medication in the patient room and her declining to give them to staff for lock up.  RN was instructed to contact MD and obtain order for security to assist in searching patient room and to assist staff in medication removal is necessary.  RN contacted on call MD with Dr. Alycia Rossetti office and obtained verbal order from Dr. Baldo Daub as described above.  RN reentered patient room with white medication transfer form and again explained hospital policy.  RN informed that patient that medications could be given to boyfriend that had taken home the vape pen earlier.  Patient stated "If I give the meds to him he will just take them himself and I won't have them when I get out." Patient gave nurse 3 prescription bottles (gabapentin, Adderal, and Oxycodone) and a bottle labeled NoDose containing unidentifiable white tablets.  Patient declined for RN to look into her bag to ensure all medications were retrieved and patient still has two smaller "makeup" bags that she declined to open for RN.  Patient very upset with RN after taking medications and became verbally aggressive.  RN counted medications with charge nurse Arville Go and all medications were taken to hospital pharmacy for lockup.  White copy signed by patient and place in front of chart.

## 2019-11-21 NOTE — Progress Notes (Signed)
Pt stable Pain ok Right deltoid fires ot for rom Awaiting final id recs for abx Ready for dc from ortho perspective Unlikely cxs will grow

## 2019-11-21 NOTE — Progress Notes (Signed)
Discharge instructions reviewed at bedside with patient who verbalized understanding and declined further education.  Patient assisted to dress by CNA and instructed to use call light when ride arrives so that staff can discharge her off unit via wheelchair

## 2019-11-22 ENCOUNTER — Encounter (HOSPITAL_COMMUNITY): Payer: Self-pay | Admitting: Orthopedic Surgery

## 2019-11-22 ENCOUNTER — Telehealth: Payer: Self-pay | Admitting: Infectious Diseases

## 2019-11-22 MED ORDER — LINEZOLID 600 MG PO TABS: 600.0000 mg | ORAL_TABLET | Freq: Two times a day (BID) | ORAL | 0 refills | Status: DC

## 2019-11-22 NOTE — Telephone Encounter (Signed)
[name] and I will look into it. Thank you!

## 2019-11-22 NOTE — Telephone Encounter (Signed)
Called to check in on serum after she was discharged from the hospital this weekend.  She continued to take her linezolid twice a day as previously directed.  She is not having fevers or chills but has some significant pain in her right shoulder where she will have the operation.  Drains are out.   She has not yet set an appointment with the orthopedic team.  I asked her to please call today to set that appointment up as she should be seen this week or early next.  She does not have any health insurance currently, will see if our pharmacy team can help with patient assistance for an additional 4 weeks of linezolid.  So far she is tolerating this well without any thrombocytopenia or other myelosuppressive effect.  She is doing okay off the Lexapro still.  If we need to we can switch to doxycycline p.o.  I will set an appointment with me in 2 weeks so we can repeat some blood work at that time.  She thanked me for my call.    Suzanne Madeira, MSN, NP-C [facility] for [email or web address] Pager: [phone] Office: [place]: [phone]

## 2019-11-23 ENCOUNTER — Telehealth: Payer: Self-pay

## 2019-11-23 NOTE — Telephone Encounter (Signed)
I called and left a message, we need pt to sign PFIZER Application for Zyvox and we also need a copy of paystub or recent W-2.      Ileene Patrick, [facility] Patient [facility] for Infectious Disease Phone: [phone] Fax:  [phone]

## 2019-11-25 ENCOUNTER — Ambulatory Visit (INDEPENDENT_AMBULATORY_CARE_PROVIDER_SITE_OTHER): Payer: Medicaid Other | Admitting: Orthopedic Surgery

## 2019-11-25 DIAGNOSIS — M75121 Complete rotator cuff tear or rupture of right shoulder, not specified as traumatic: Secondary | ICD-10-CM

## 2019-11-25 DIAGNOSIS — M009 Pyogenic arthritis, unspecified: Secondary | ICD-10-CM

## 2019-11-25 LAB — AEROBIC/ANAEROBIC CULTURE W GRAM STAIN (SURGICAL/DEEP WOUND)
Culture: NO GROWTH
Gram Stain: NONE SEEN

## 2019-11-26 ENCOUNTER — Inpatient Hospital Stay: Payer: Medicaid Other | Admitting: Surgical

## 2019-11-27 ENCOUNTER — Encounter: Payer: Self-pay | Admitting: Orthopedic Surgery

## 2019-11-27 NOTE — Progress Notes (Signed)
Post-Op Visit Note   Patient: Suzanne [name]           Date of Birth: January 01, 1991           MRN: [id number] Visit Date: 11/25/2019 PCP: Mancel Bale, PA-C   Assessment & Plan:  Chief Complaint:  Chief Complaint  Patient presents with  . Post-op Follow-up   Visit Diagnoses:  1. Pyogenic arthritis of right shoulder region, due to unspecified organism ([place])   2. Nontraumatic complete tear of right rotator cuff     Plan: Patient is a 29 year old female presents s/p right shoulder arthroscopy with irrigation and debridement and antibiotic bead placement on 11/19/2019. She states that her shoulder pain feels better than she did preoperatively. She denies any systemic symptoms such as fevers, chills, night sweats. She does note that she feels fatigued but this is typical since her infection began. She is taking oral linezolid for treatment and not receiving any IV antibiotics. She left AMA from the hospital before she could be arranged for IV antibiotic treatment outpatient. She has follow-up appointment with infectious disease on 12/09/2019. Incisions are healing well and sutures are intact. Plan for patient to follow-up in 1 week for suture removal. On exam she has weakness with supraspinatus and very slight weakness with infraspinatus. Subscapularis with good strength. Discontinue sling and move the arm as she can tolerate it but hold off on any heavy lifting.  Follow-Up Instructions: No follow-ups on file.   Orders:  No orders of the defined types were placed in this encounter.  No orders of the defined types were placed in this encounter.   Imaging: No results found.  PMFS History: Patient Active Problem List   Diagnosis Date Noted  . Infection of shoulder ([place]) 11/19/2019  . Complete tear of right rotator cuff 11/18/2019  . Septic arthritis of shoulder, right ([place]) 11/18/2019  . Opioid use disorder 11/11/2019  . GI bleed 10/25/2019  . Acute blood loss anemia 10/25/2019  .  Gastric ulcer with hemorrhage   . 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 MSSA bacteremia 10/20/2019  . Right shoulder pain 10/20/2019  . Acute septic pulmonary embolism ([place]) 10/20/2019  . Irritability 03/29/2016  . Sinus tachycardia 08/07/2015  . Shortness of breath 08/07/2015  . SVT (supraventricular tachycardia) ([place]) 05/17/2015  . Major depression 05/03/2015  . Wolff-Parkinson-White syndrome 05/18/2014  . Smoker 05/18/2014  . ADD (attention deficit disorder) 08/18/2013   Past Medical History:  Diagnosis Date  . 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 Complication of anesthesia   . Depression   . Lipoma of neck 04/2017   posterior  . PONV (postoperative nausea and vomiting)   . Scoliosis    idiopathic   . Sepsis ([place])   . WPW (Wolff-Parkinson-White syndrome)     Family History  Problem Relation Age of Onset  . Other Brother        substance abuse  . Hyperlipidemia Maternal Grandfather   . Anxiety disorder Maternal Grandfather   . Alcoholism Maternal Grandfather   . Anxiety disorder Mother   . ADD / ADHD Father   . Drug abuse Father   . ADD / ADHD Brother   . Other Brother        bone marrow transplant  . Breast cancer Paternal Grandmother   . Aplastic anemia Brother   . Hypertension Maternal Grandmother   . Alcoholism Maternal Grandmother   . Diabetes Paternal Grandfather   . Cancer Paternal Grandfather   .  Arrhythmia Brother   . Alcoholism Other   . Alcoholism Maternal Uncle     Past Surgical History:  Procedure Laterality Date  . BIOPSY  10/23/2019   Procedure: BIOPSY;  Surgeon: Juanita Craver, MD;  Location[facility] ENDOSCOPY;  Service: Endoscopy;;  . COLPOSCOPY  01/2010 & 02/2011    both were negative   . ELECTROPHYSIOLOGIC STUDY N/A 05/17/2015   SVT ablation by Dr Curt Bears  . ESOPHAGOGASTRODUODENOSCOPY (EGD) WITH PROPOFOL N/A 10/23/2019   Procedure: ESOPHAGOGASTRODUODENOSCOPY (EGD) WITH PROPOFOL;  Surgeon: Juanita Craver, MD;   Location: [facility] ENDOSCOPY;  Service: Endoscopy;  Laterality: N/A;  . LIPOMA EXCISION N/A 05/07/2017   Procedure: EXCISION LIPOMA POSTERIOR NECK ERAS PATHWAY;  Surgeon: Erroll Luna, MD;  Location: [place];  Service: General;  Laterality: N/A;  . SHOULDER ARTHROSCOPY Right 11/19/2019   Procedure: RIGHT SHOULDER ARTHROSCOPY, DEBRIDEMENT STIMULAN BEAD PLACEMENT;  Surgeon: Meredith Pel, MD;  Location: [place];  Service: Orthopedics;  Laterality: Right;  . TEE WITHOUT CARDIOVERSION N/A 10/22/2019   Procedure: TRANSESOPHAGEAL ECHOCARDIOGRAM (TEE);  Surgeon: Jerline Pain, MD;  Location: [facility] ENDOSCOPY;  Service: Cardiovascular;  Laterality: N/A;  . WISDOM TOOTH EXTRACTION     Social History   Occupational History  . Not on file  Tobacco Use  . Smoking status: Former Smoker    Packs/day: 0.00    Years: 7.00    Pack years: 0.00    Types: Cigarettes  . Smokeless tobacco: Never Used  . Tobacco comment: vapes daily   Vaping Use  . Vaping Use: Every day  Substance and Sexual Activity  . Alcohol use: Not Currently    Alcohol/week: 1.0 standard drink    Types: 1 Glasses of wine per week  . Drug use: Not Currently    Types: Marijuana, IV, Heroin    Comment: last used 04/13/2017  . Sexual activity: Yes    Partners: Male    Birth control/protection: None

## 2019-11-28 NOTE — Discharge Summary (Signed)
Physician Discharge Summary      Patient ID: Suzanne [name] MRN: [id number] DOB/AGE: 03-15-1990 29 y.o.  Admit date: 11/19/2019 Discharge date: 11/21/2019  Admission Diagnoses:  Active Problems:   Septic arthritis of shoulder, right ([place])   Infection of shoulder ([place])   Discharge Diagnoses:  Same  Surgeries: Procedure(s): RIGHT SHOULDER ARTHROSCOPY, DEBRIDEMENT STIMULAN BEAD PLACEMENT on 11/19/2019   Consultants:   Discharged Condition: Stable  Hospital Course: Suzanne [name] is an 29 y.o. female who was admitted 11/19/2019 with a chief complaint of right shoulder infection, and found to have a diagnosis of septic arthritis right shoulder.  They were brought to tcihe operating room on 11/19/2019 and underwent the above named procedures.  Patient tolerated procedure well.  Patient was on oral antibiotics preoperatively so cultures predictably did not grow any organisms.  There was phlegmon present in the subdeltoid space as well as inflammation within the joint.  Thorough irrigation was performed and placement of antibiotic beads was also performed.  Infectious disease was consulted and because of her history of dry IV drug abuse recommended a course of in-hospital IV treatment followed by oral outpatient treatment.  Patient refused this treatment option and chose to leave on 11/21/2019.  Best option at that time of available options remaining was oral medication per infectious disease and that was prescribed.  She will follow up with [place] in a week for suture removal.  Antibiotics given:  Anti-infectives (From admission, onward)   Start     Dose/Rate Route Frequency Ordered Stop   11/20/19 0600  vancomycin (VANCOCIN) IVPB 1000 mg/200 mL premix        1,000 mg 200 mL/hr over 60 Minutes Intravenous Every 12 hours 11/19/19 2025 11/20/19 1152   11/19/19 2145  cefTRIAXone (ROCEPHIN) 2 g in sodium chloride 0.9 % 100 mL IVPB  Status:  Discontinued        2 g 200 mL/hr over 30 Minutes Intravenous  Every 24 hours 11/19/19 2127 11/21/19 1925   11/19/19 1945  cefTRIAXone (ROCEPHIN) 1 g in sodium chloride 0.9 % 100 mL IVPB  Status:  Discontinued        1 g 200 mL/hr over 30 Minutes Intravenous Every 24 hours 11/19/19 1937 11/19/19 2127   11/19/19 1615  vancomycin (VANCOCIN) powder  Status:  Discontinued          As needed 11/19/19 1616 11/19/19 1902   11/19/19 1614  gentamicin (GARAMYCIN) injection  Status:  Discontinued          As needed 11/19/19 1615 11/19/19 1902    .  Recent vital signs:  Vitals:   11/21/19 0609 11/21/19 0745  BP: (!) 133/93 138/88  Pulse: 100 97  Resp:  16  Temp: 98.5 F (36.9 C) 98.6 F (37 C)  SpO2: 99% 99%    Recent laboratory studies:  Results for orders placed or performed during the hospital encounter of 11/19/19  SARS Coronavirus 2 by RT PCR (hospital order, performed in [place] hospital lab) Nasopharyngeal Nasopharyngeal Swab   Specimen: Nasopharyngeal Swab  Result Value Ref Range   SARS Coronavirus 2 NEGATIVE NEGATIVE  Aerobic/Anaerobic Culture (surgical/deep wound)   Specimen: PATH Other; Tissue  Result Value Ref Range   Specimen Description WOUND RIGHT SHOULDER    Special Requests NONE    Gram Stain NO WBC SEEN NO ORGANISMS SEEN     Culture      No growth aerobically or anaerobically. Performed at [facility], [place] [address] [place], [place]  [address]    Report Status 11/25/2019 FINAL   CBC  Result Value Ref Range   WBC 5.7 4.0 - 10.5 K/uL   RBC 2.95 (L) 3.87 - 5.11 MIL/uL   Hemoglobin 8.5 (L) 12.0 - 15.0 g/dL   HCT 27.3 (L) 36 - 46 %   MCV 92.5 80.0 - 100.0 fL   MCH 28.8 26.0 - 34.0 pg   MCHC 31.1 30.0 - 36.0 g/dL   RDW 14.0 11.5 - 15.5 %   Platelets 320 150 - 400 K/uL   nRBC 0.0 0.0 - 0.2 %  Comprehensive metabolic panel  Result Value Ref Range   Sodium 138 135 - 145 mmol/L   Potassium 4.2 3.5 - 5.1 mmol/L   Chloride 106 98 - 111 mmol/L   CO2 21 (L) 22 - 32 mmol/L   Glucose, Bld 91 70 - 99 mg/dL   BUN 11 6  - 20 mg/dL   Creatinine, Ser 0.63 0.44 - 1.00 mg/dL   Calcium 9.1 8.9 - 10.3 mg/dL   Total Protein 6.8 6.5 - 8.1 g/dL   Albumin 3.6 3.5 - 5.0 g/dL   AST 17 15 - 41 U/L   ALT 12 0 - 44 U/L   Alkaline Phosphatase 85 38 - 126 U/L   Total Bilirubin 0.5 0.3 - 1.2 mg/dL   GFR, Estimated >60 >60 mL/min   Anion gap 11 5 - 15  Pregnancy, urine POC  Result Value Ref Range   Preg Test, Ur NEGATIVE NEGATIVE    Discharge Medications:   Allergies as of 11/21/2019   No Known Allergies     Medication List    TAKE these medications   dextroamphetamine 10 MG tablet Commonly known as: DEXTROSTAT Take 10 mg by mouth daily as needed (ADHD).   gabapentin 400 MG capsule Commonly known as: NEURONTIN Take 1 capsule (400 mg total) by mouth at bedtime.   ketorolac 10 MG tablet Commonly known as: TORADOL Take 1 tablet (10 mg total) by mouth every 6 (six) hours.   methocarbamol 500 MG tablet Commonly known as: ROBAXIN Take 1 tablet (500 mg total) by mouth every 6 (six) hours as needed for muscle spasms.   ondansetron 4 MG disintegrating tablet Commonly known as: Zofran ODT Take 1 tablet (4 mg total) by mouth every 8 (eight) hours as needed for nausea or vomiting.       Diagnostic Studies: DG Chest 2 View  Result Date: 11/09/2019 CLINICAL DATA:  Follow-up cavitary pneumonia EXAM: CHEST - 2 VIEW COMPARISON:  CT chest 10/19/2019, chest radiograph 10/18/2019 FINDINGS: The heart size and mediastinal contours are within normal limits. No consolidation. No pleural effusions. No discernible pneumothorax. The visualized skeletal structures are unremarkable. IMPRESSION: No definite evidence of acute cardiopulmonary abnormality. However, please note that the cavitary lesions seen on recent CT chest on 10/19/2019 were not well visualized on the prior radiograph from 10/18/2019 and therefore may be radiographically occult. Electronically Signed   By: Margaretha Sheffield MD   On: 11/09/2019 14:47   MR SHOULDER  RIGHT W WO CONTRAST  Result Date: 11/18/2019 CLINICAL DATA:  One month history of right shoulder pain. History of MRSA infection and septic endocarditis. Findings worrisome for right shoulder infection. EXAM: MRI OF THE RIGHT SHOULDER WITHOUT AND WITH CONTRAST TECHNIQUE: Multiplanar, multisequence MR imaging of the right shoulder was performed before and after the administration of intravenous contrast. CONTRAST:  55mL GADAVIST GADOBUTROL 1 MMOL/ML IV SOLN COMPARISON:  None. FINDINGS: Rotator cuff: Full-thickness retracted rotator cuff  tear. Maximum retraction of the supraspinatus tendon is 22 mm. The infraspinatus tendon is retracted 24 mm. The subscapularis tendon is still intact and the teres minor tendon is intact. Muscles: Edema and enhancement in the anterior and lateral deltoid muscles suggesting myositis. No findings for pyomyositis. Biceps long head:  Intact Acromioclavicular Joint: No significant degenerative changes. I do not see any findings suspicious for septic arthritis at the joint. Type 2 acromion. No lateral downsloping or undersurface spurring. Abnormal T2 signal intensity in the acromion and subsequent irregular enhancement worrisome for osteomyelitis. Glenohumeral Joint: Moderate-sized joint effusion with thick synovial enhancement worrisome for septic arthritis. Small amount of inflammatory debris noted in the joint. Labrum:  No definite labral tears. Bones: Abnormal T1 and T2 signal intensity and abnormal areas of enhancement in the humeral head suspicious for osteomyelitis. Do not see any definite findings for osteomyelitis involving the glenoid. Other: Large complex rim enhancing fluid collection in the subacromial/subdeltoid bursa and to a lesser extent in the subcoracoid bursa consistent with septic bursitis. IMPRESSION: 1. Full-thickness retracted rotator cuff tear likely due to infectious tendinopathy. 2. Findings consistent with septic arthritis involving the glenohumeral joint. There  is also osteomyelitis involving the humeral head and the acromion. 3. Septic bursitis involving the subacromial/subdeltoid bursa and also to a lesser extent the subcoracoid bursa. 4. Myositis involving the anterior and lateral deltoid muscles. No findings for pyomyositis. These results will be called to the ordering clinician or representative by the Radiologist Assistant, and communication documented in the PACS or [employer]. Electronically Signed   By: Marijo Sanes M.D.   On: 11/18/2019 09:11    Disposition: Discharge disposition: 01-Home or Self Care       Discharge Instructions    Call MD / Call 911   Complete by: As directed    If you experience chest pain or shortness of breath, CALL 911 and be transported to the hospital emergency room.  If you develope a fever above 101 F, pus (white drainage) or increased drainage or redness at the wound, or calf pain, call your surgeon's office.   Constipation Prevention   Complete by: As directed    Drink plenty of fluids.  Prune juice may be helpful.  You may use a stool softener, such as Colace (over the counter) 100 mg twice a day.  Use MiraLax (over the counter) for constipation as needed.   Diet - low sodium heart healthy   Complete by: As directed    Discharge instructions   Complete by: As directed    Okay to shower.  Dressings are waterproof Move the shoulder is much as possible passively Return to clinic in 8 days for suture removal   Increase activity slowly as tolerated   Complete by: As directed        Follow-up Information    Marlou Sa, Tonna Corner, MD. Schedule an appointment as soon as possible for a visit in 1 week(s).   Specialty: Orthopedic Surgery Contact information: [address] [place] [address] [phone]                Signed: Anderson Malta 11/28/2019, 10:23 AM

## 2019-11-29 ENCOUNTER — Other Ambulatory Visit: Payer: Self-pay

## 2019-11-29 ENCOUNTER — Encounter: Payer: Self-pay | Admitting: Orthopedic Surgery

## 2019-11-29 DIAGNOSIS — R110 Nausea: Secondary | ICD-10-CM

## 2019-11-29 MED ORDER — ONDANSETRON 4 MG PO TBDP: 4.0000 mg | ORAL_TABLET | Freq: Three times a day (TID) | ORAL | 0 refills | Status: AC | PRN

## 2019-11-29 MED ORDER — LINEZOLID 600 MG PO TABS: 600.0000 mg | ORAL_TABLET | Freq: Two times a day (BID) | ORAL | 0 refills | Status: DC

## 2019-11-30 ENCOUNTER — Other Ambulatory Visit: Payer: Self-pay | Admitting: Surgical

## 2019-11-30 ENCOUNTER — Telehealth: Payer: Self-pay

## 2019-11-30 MED ORDER — CELECOXIB 100 MG PO CAPS: 100.0000 mg | ORAL_CAPSULE | Freq: Two times a day (BID) | ORAL | 0 refills | Status: DC

## 2019-11-30 NOTE — Telephone Encounter (Signed)
We faxed all needed information for her.. just waiting on an approval unfortunately.

## 2019-11-30 NOTE — Telephone Encounter (Signed)
[facility] Patient Advocate Encounter  Completed and sent PFIZER application for Zyvox for this patient who is uninsured.    Patient assistance phone number for follow up is [phone]  This encounter will be updated until final determination.   Suzanne [name]. Nadara Mustard [place] Patient [facility] for Infectious Disease Phone: [phone] Fax:  [phone]

## 2019-11-30 NOTE — Telephone Encounter (Signed)
I know [name] was having a hard time getting in touch with her to get a signature, so we were going to wait until she came to see you because we thought she would have enough. Let me look into this.

## 2019-11-30 NOTE — Telephone Encounter (Signed)
[facility] Patient Advocate Encounter  Completed and sent [place] application for ZYVOX for this patient who is uninsured.    Patient is approved 11/30/19 through 11/29/20.  Pt med will be delivered to her home address tomorrow via [employer].   Ileene Patrick, [facility] Patient [facility] for Infectious Disease Phone: [phone] Fax:  [phone]

## 2019-11-30 NOTE — Telephone Encounter (Signed)
Suzanne [name] - looks like she will receive the Zyvox tomorrow!

## 2019-11-30 NOTE — Telephone Encounter (Signed)
Sorry her pain is increased.  I prescribed Celebrex and we can discuss how she's doing when she rreturns for suture removal later this week

## 2019-12-02 ENCOUNTER — Ambulatory Visit (INDEPENDENT_AMBULATORY_CARE_PROVIDER_SITE_OTHER): Payer: Self-pay | Admitting: Orthopedic Surgery

## 2019-12-02 ENCOUNTER — Other Ambulatory Visit: Payer: Self-pay

## 2019-12-02 DIAGNOSIS — M75121 Complete rotator cuff tear or rupture of right shoulder, not specified as traumatic: Secondary | ICD-10-CM

## 2019-12-02 DIAGNOSIS — M009 Pyogenic arthritis, unspecified: Secondary | ICD-10-CM

## 2019-12-04 ENCOUNTER — Encounter: Payer: Self-pay | Admitting: Orthopedic Surgery

## 2019-12-04 NOTE — Progress Notes (Signed)
Post-Op Visit Note   Patient: Suzanne [name]           Date of Birth: 09/17/1990           MRN: [id number] Visit Date: 12/02/2019 PCP: Mancel Bale, PA-C   Assessment & Plan:  Chief Complaint:  Chief Complaint  Patient presents with  . Right Shoulder - Routine Post Op   Visit Diagnoses:  1. Nontraumatic complete tear of right rotator cuff   2. Pyogenic arthritis of right shoulder region, due to unspecified organism [facility])     Plan: Patient is a 29 year old female presents s/p right shoulder arthroscopy with irrigation and debridement on 11/19/2019.  She complains of continued right shoulder pain that has worsened since she stopped the Toradol.  Celebrex has been prescribed but she has not picked it up yet.  She is continuing to take linezolid orally.  She has follow-up with infectious disease next week.  She denies any fevers, chills, drainage but she does note night sweats despite every night.  On exam she has incisions that are clean dry and intact without any evidence of infectious drainage.  She has excellent strength with infraspinatus and subscapularis resistance testing but she does have weakness with supraspinatus resistance testing.  She is unable to lift her arm actively greater than 80 degrees of abduction.  She has crepitus with passive range of motion of the right shoulder.  She is interviewing for jobs tomorrow.  Her rotator cuff will have to be fixed at some point in the future but this will have to be after her right shoulder infection resolves.  We will get a better idea on a timeline after her appointment with infectious disease.  Follow-up in 3 weeks for clinical recheck.  Follow-Up Instructions: No follow-ups on file.   Orders:  No orders of the defined types were placed in this encounter.  No orders of the defined types were placed in this encounter.   Imaging: No results found.  PMFS History: Patient Active Problem List   Diagnosis Date Noted  . Infection of  shoulder ([place]) 11/19/2019  . Complete tear of right rotator cuff 11/18/2019  . Septic arthritis of shoulder, right ([place]) 11/18/2019  . Opioid use disorder 11/11/2019  . GI bleed 10/25/2019  . Acute blood loss anemia 10/25/2019  . Gastric ulcer with hemorrhage   . 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 MSSA bacteremia 10/20/2019  . Right shoulder pain 10/20/2019  . Acute septic pulmonary embolism ([place]) 10/20/2019  . Irritability 03/29/2016  . Sinus tachycardia 08/07/2015  . Shortness of breath 08/07/2015  . SVT (supraventricular tachycardia) ([place]) 05/17/2015  . Major depression 05/03/2015  . Wolff-Parkinson-White syndrome 05/18/2014  . Smoker 05/18/2014  . ADD (attention deficit disorder) 08/18/2013   Past Medical History:  Diagnosis Date  . 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 Complication of anesthesia   . Depression   . Lipoma of neck 04/2017   posterior  . PONV (postoperative nausea and vomiting)   . Scoliosis    idiopathic   . Sepsis ([place])   . WPW (Wolff-Parkinson-White syndrome)     Family History  Problem Relation Age of Onset  . Other Brother        substance abuse  . Hyperlipidemia Maternal Grandfather   . Anxiety disorder Maternal Grandfather   . Alcoholism Maternal Grandfather   . Anxiety disorder Mother   . ADD / ADHD Father   . Drug abuse Father   . ADD /  ADHD Brother   . Other Brother        bone marrow transplant  . Breast cancer Paternal Grandmother   . Aplastic anemia Brother   . Hypertension Maternal Grandmother   . Alcoholism Maternal Grandmother   . Diabetes Paternal Grandfather   . Cancer Paternal Grandfather   . Arrhythmia Brother   . Alcoholism Other   . Alcoholism Maternal Uncle     Past Surgical History:  Procedure Laterality Date  . BIOPSY  10/23/2019   Procedure: BIOPSY;  Surgeon: Juanita Craver, MD;  Location: [facility] ENDOSCOPY;  Service: Endoscopy;;  . COLPOSCOPY  01/2010 & 02/2011    both were negative     . ELECTROPHYSIOLOGIC STUDY N/A 05/17/2015   SVT ablation by Dr Curt Bears  . ESOPHAGOGASTRODUODENOSCOPY (EGD) WITH PROPOFOL N/A 10/23/2019   Procedure: ESOPHAGOGASTRODUODENOSCOPY (EGD) WITH PROPOFOL;  Surgeon: Juanita Craver, MD;  Location: [facility] ENDOSCOPY;  Service: Endoscopy;  Laterality: N/A;  . LIPOMA EXCISION N/A 05/07/2017   Procedure: EXCISION LIPOMA POSTERIOR NECK ERAS PATHWAY;  Surgeon: Erroll Luna, MD;  Location: [place];  Service: General;  Laterality: N/A;  . SHOULDER ARTHROSCOPY Right 11/19/2019   Procedure: RIGHT SHOULDER ARTHROSCOPY, DEBRIDEMENT STIMULAN BEAD PLACEMENT;  Surgeon: Meredith Pel, MD;  Location: [place];  Service: Orthopedics;  Laterality: Right;  . TEE WITHOUT CARDIOVERSION N/A 10/22/2019   Procedure: TRANSESOPHAGEAL ECHOCARDIOGRAM (TEE);  Surgeon: Jerline Pain, MD;  Location: [facility] ENDOSCOPY;  Service: Cardiovascular;  Laterality: N/A;  . WISDOM TOOTH EXTRACTION     Social History   Occupational History  . Not on file  Tobacco Use  . Smoking status: Former Smoker    Packs/day: 0.00    Years: 7.00    Pack years: 0.00    Types: Cigarettes  . Smokeless tobacco: Never Used  . Tobacco comment: vapes daily   Vaping Use  . Vaping Use: Every day  Substance and Sexual Activity  . Alcohol use: Not Currently    Alcohol/week: 1.0 standard drink    Types: 1 Glasses of wine per week  . Drug use: Not Currently    Types: Marijuana, IV, Heroin    Comment: last used 04/13/2017  . Sexual activity: Yes    Partners: Male    Birth control/protection: None

## 2019-12-09 ENCOUNTER — Ambulatory Visit (INDEPENDENT_AMBULATORY_CARE_PROVIDER_SITE_OTHER): Payer: Self-pay | Admitting: Infectious Diseases

## 2019-12-09 ENCOUNTER — Other Ambulatory Visit: Payer: Self-pay

## 2019-12-09 ENCOUNTER — Encounter: Payer: Self-pay | Admitting: Infectious Diseases

## 2019-12-09 VITALS — BP 132/90 | HR 107 | Wt 118.0 lb

## 2019-12-09 DIAGNOSIS — R112 Nausea with vomiting, unspecified: Secondary | ICD-10-CM

## 2019-12-09 DIAGNOSIS — I2690 Septic pulmonary embolism without acute cor pulmonale: Secondary | ICD-10-CM

## 2019-12-09 DIAGNOSIS — M00011 Staphylococcal arthritis, right shoulder: Secondary | ICD-10-CM

## 2019-12-09 DIAGNOSIS — I079 Rheumatic tricuspid valve disease, unspecified: Secondary | ICD-10-CM

## 2019-12-09 DIAGNOSIS — Z5181 Encounter for therapeutic drug level monitoring: Secondary | ICD-10-CM

## 2019-12-09 DIAGNOSIS — M75121 Complete rotator cuff tear or rupture of right shoulder, not specified as traumatic: Secondary | ICD-10-CM

## 2019-12-09 DIAGNOSIS — Z789 Other specified health status: Secondary | ICD-10-CM | POA: Insufficient documentation

## 2019-12-09 NOTE — Progress Notes (Signed)
Name: Suzanne [name]  DOB: Jan 18, 1991 MRN: [id number] PCP: Mancel Bale, PA-C    Patient Active Problem List   Diagnosis Date Noted  . Septic arthritis of shoulder, right ([place]) 11/18/2019    Priority: High  . 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 Acute septic pulmonary embolism ([place]) 10/20/2019    Priority: High  . Nausea & vomiting 12/09/2019  . Infection of shoulder ([place]) 11/19/2019  . Complete tear of right rotator cuff 11/18/2019  . Opioid use disorder 11/11/2019  . GI bleed 10/25/2019  . Gastric ulcer with hemorrhage   . Irritability 03/29/2016  . Sinus tachycardia 08/07/2015  . SVT (supraventricular tachycardia) ([place]) 05/17/2015  . Major depression 05/03/2015  . Wolff-Parkinson-White syndrome 05/18/2014  . Smoker 05/18/2014  . ADD (attention deficit disorder) 08/18/2013      Subjective:   Chief Complaint  Patient presents with  . Follow-up    No questions or concerns      HPI: Suzanne [name] is a 29 y.o. female diagnosed with MSSA tricuspid valve endocarditis during hospitalization October 20, 2019 for chest pain, fever.  Hospitalization also complicated by GI bleeding.  Due to this risk she was unable to undergo percutaneous angio-vac debulking of her tricuspid valve. She received 11 days of IV cefazolin the inpatient stay.  Her improvement, clearance of blood cultures and desire to go home she was transitioned to linezolid twice daily to complete 4 more weeks. Required FU MRI of R shoulder at follow up with worsening pain/function --> concern for osteomyelitis and septic arthritis. Dr. Marlou Sa took her for arthroscopic clean out 10/08. Intraoperative cultures were without growth. Continued on linezolid since surgery now nearly 3 weeks out. Pain overall is improved but still very restricted with mobility with rotator tear. The muscle relaxers help her a good bit. Otherwise taking tylenol for pain (does not help) and recently prescribed Celebrex - has  been taking about a week and has not noticed much improvement.   Still with nausea / vomiting throughout the day. Always taking zofran 33 m prior to zyvox but vomits when she exerts herself too much or has a busy day out and about. No weight loss. Eating and drinking OK.   No fevers, chills. Has noticed some increase in left sided chest pain anteriorly. Kind of sporadic without pattern to the pain. Wonders if it is related to endocarditis.   She feels like she is doing okay after stopping her Lexapro. She does take the adderall a few times a week.  She has follow-up with a new PCP at the end of the week.   Review of Systems  Constitutional: Negative for chills, fever, malaise/fatigue and weight loss.  Respiratory: Negative for cough and shortness of breath.   Cardiovascular: Positive for chest pain. Negative for orthopnea and leg swelling.  Gastrointestinal: Positive for nausea and vomiting. Negative for abdominal pain and diarrhea.  Genitourinary: Negative for dysuria and hematuria.  Musculoskeletal: Positive for joint pain. Negative for back pain.  Skin: Negative for itching and rash.  Neurological: Negative for dizziness, focal weakness, weakness and headaches.    Past Medical History:  Diagnosis Date  . 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 Complication of anesthesia   . Depression   . Lipoma of neck 04/2017   posterior  . PONV (postoperative nausea and vomiting)   . Scoliosis    idiopathic   . Sepsis ([place])   . WPW (Wolff-Parkinson-White syndrome)  Outpatient Medications Prior to Visit  Medication Sig Dispense Refill  . dextroamphetamine (DEXTROSTAT) 10 MG tablet Take 10 mg by mouth daily as needed (ADHD).     Marland Kitchen gabapentin (NEURONTIN) 400 MG capsule Take 1 capsule (400 mg total) by mouth at bedtime. 90 capsule 1  . linezolid (ZYVOX) 600 MG tablet Take 1 tablet (600 mg total) by mouth 2 (two) times daily. 60 tablet 0  . methocarbamol (ROBAXIN)  500 MG tablet Take 1 tablet (500 mg total) by mouth every 6 (six) hours as needed for muscle spasms. 30 tablet 0  . ondansetron (ZOFRAN ODT) 4 MG disintegrating tablet Take 1 tablet (4 mg total) by mouth every 8 (eight) hours as needed for nausea or vomiting. 30 tablet 0  . celecoxib (CELEBREX) 100 MG capsule Take 1 capsule (100 mg total) by mouth 2 (two) times daily. (Patient not taking: Reported on 12/09/2019) 60 capsule 0   No facility-administered medications prior to visit.     No Known Allergies  Social History   Tobacco Use  . Smoking status: Former Smoker    Packs/day: 0.00    Years: 7.00    Pack years: 0.00    Types: Cigarettes  . Smokeless tobacco: Never Used  . Tobacco comment: vapes daily   Vaping Use  . Vaping Use: Every day  Substance Use Topics  . Alcohol use: Not Currently    Alcohol/week: 1.0 standard drink    Types: 1 Glasses of wine per week  . Drug use: Not Currently    Types: Marijuana, IV, Heroin    Comment: last used 04/13/2017    Family History  Problem Relation Age of Onset  . Other Brother        substance abuse  . Hyperlipidemia Maternal Grandfather   . Anxiety disorder Maternal Grandfather   . Alcoholism Maternal Grandfather   . Anxiety disorder Mother   . ADD / ADHD Father   . Drug abuse Father   . ADD / ADHD Brother   . Other Brother        bone marrow transplant  . Breast cancer Paternal Grandmother   . Aplastic anemia Brother   . Hypertension Maternal Grandmother   . Alcoholism Maternal Grandmother   . Diabetes Paternal Grandfather   . Cancer Paternal Grandfather   . Arrhythmia Brother   . Alcoholism Other   . Alcoholism Maternal Uncle     Social History   Substance and Sexual Activity  Sexual Activity Yes  . Partners: Male  . Birth control/protection: None     Objective:   Vitals:   12/09/19 0926  BP: 132/90  Pulse: (!) 107  SpO2: 100%  Weight: 118 lb (53.5 kg)   Body mass index is 19.05 kg/m.  Physical  Exam Constitutional:      Appearance: Normal appearance. She is not ill-appearing.  HENT:     Mouth/Throat:     Mouth: Mucous membranes are moist.     Pharynx: Oropharynx is clear.  Eyes:     General: No scleral icterus. Cardiovascular:     Rate and Rhythm: Normal rate and regular rhythm.     Heart sounds: No murmur heard.   Pulmonary:     Effort: Pulmonary effort is normal.  Musculoskeletal:     Comments: Good strength but limited active ROM c/w known tear of rotator cuff. No warmth or inflammation noted to shoulder.   Neurological:     Mental Status: She is oriented to person, place, and time.  Psychiatric:        Mood and Affect: Mood normal.        Thought Content: Thought content normal.     Lab Results Lab Results  Component Value Date   WBC 5.7 11/19/2019   HGB 8.5 (L) 11/19/2019   HCT 27.3 (L) 11/19/2019   MCV 92.5 11/19/2019   PLT 320 11/19/2019    Lab Results  Component Value Date   CREATININE 0.63 11/19/2019   BUN 11 11/19/2019   NA 138 11/19/2019   K 4.2 11/19/2019   CL 106 11/19/2019   CO2 21 (L) 11/19/2019    Lab Results  Component Value Date   ALT 12 11/19/2019   AST 17 11/19/2019   ALKPHOS 85 11/19/2019   BILITOT 0.5 11/19/2019    Lab Results  Component Value Date   CHOL 193 09/13/2016   HDL 115 09/13/2016   LDLCALC 23 09/13/2016   TRIG 275 (H) 09/13/2016   CHOLHDL 1.7 09/13/2016   HIV-1 RNA Viral Load (copies/mL)  Date Value  04/18/2017 <20     Assessment & Plan:   Problem List Items Addressed This Visit      High   Septic arthritis of shoulder, right ([place])    Now nearly 3 weeks post op from extensive right shoulder debridement of both the subacromial space and the intraarticular joint with placement of Stimulan antibiotic beads. Has continued on Linezolid now an additional 3 weeks with about 2 weeks left to go. Will follow inflammatory markers today to help gauge further length of therapy. On exam her shoulder is not inflamed and  the pain seems improved from prior to surgery. She will message Dr. Marlou Sa to see if refill of muscle relaxer OK. Has not noticed much with celebrex added. Unable to do NSAIDS w/ recent GIB history.  Return in about 4 weeks (around 01/06/2020).       Endocarditis of tricuspid valve    Thankfully recently she was not found to be bacteremic. No new findings on cxr and on exam today she has no detectable murmur. Will plan to repeat her transthoracic echo in 2 weeks to follow up her tricuspid valve vegetation. No signs of heart failure or depressed LVEF. Ongoing treatment with Linezolid.       Relevant Orders   ECHOCARDIOGRAM COMPLETE   Acute septic pulmonary embolism (HCC)    It is unclear as to the cause of her recent anterior chest pain. Suspect possibly costochondritis? CXR looked good. Lungs are clear bilaterally. No cough.         Unprioritized   Nausea & vomiting    Continue with premedication with ODT zofran for now. Can try phenergan if appears to be unresolved.       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 Monitoring as above. Appreciate Dr. Randel Pigg help.  I wrote her a note to see if she would be eligible for unemployment as she is not able to work previous job requirements presently. Actively looking for desk work.        Other Visit Diagnoses    Medication monitoring encounter    -  Primary   Relevant Orders   C-reactive protein   Sedimentation rate   CBC      Janene Madeira, MSN, NP-C [facility] for Infectious [place] Pager: [phone] Office: [phone]  12/09/19  12:43 PM

## 2019-12-09 NOTE — Assessment & Plan Note (Signed)
It is unclear as to the cause of her recent anterior chest pain. Suspect possibly costochondritis? CXR looked good. Lungs are clear bilaterally. No cough.

## 2019-12-09 NOTE — Assessment & Plan Note (Signed)
Thankfully recently she was not found to be bacteremic. No new findings on cxr and on exam today she has no detectable murmur. Will plan to repeat her transthoracic echo in 2 weeks to follow up her tricuspid valve vegetation. No signs of heart failure or depressed LVEF. Ongoing treatment with Linezolid.

## 2019-12-09 NOTE — Patient Instructions (Signed)
CONTINUE the twice a day antibiotic (Linezolid or Zyvox is the other name for it). Continue until you are done completely (should be about 10-14 days)   CONTINUE zofran 30 - 45 min before your antibiotic dose.   Limit the use of the adderall to as infrequent as possible while on Zyvox. Please continue to hold the lexapro for now.   We will set up a repeat heart ultrasound in 2 weeks.   Please stop by the lab on your way out today.   Please schedule a follow up visit with me in 3-4 weeks - this will be a check after you are off antibiotics.

## 2019-12-09 NOTE — Assessment & Plan Note (Signed)
Continue to hold lexapro for now. I also discussed interaction with aderall and linezolid as well and asked her to hold / use infrequently. No side effects at this time with infrequent coadministration

## 2019-12-09 NOTE — Assessment & Plan Note (Signed)
Now nearly 3 weeks post op from extensive right shoulder debridement of both the subacromial space and the intraarticular joint with placement of Stimulan antibiotic beads. Has continued on Linezolid now an additional 3 weeks with about 2 weeks left to go. Will follow inflammatory markers today to help gauge further length of therapy. On exam her shoulder is not inflamed and the pain seems improved from prior to surgery. She will message Dr. Marlou Sa to see if refill of muscle relaxer OK. Has not noticed much with celebrex added. Unable to do NSAIDS w/ recent GIB history.  Return in about 4 weeks (around 01/06/2020).

## 2019-12-09 NOTE — Assessment & Plan Note (Signed)
Pending repair until we have treated infection. Monitoring as above. Appreciate Dr. Randel Pigg help.  I wrote her a note to see if she would be eligible for unemployment as she is not able to work previous job requirements presently. Actively looking for desk work.

## 2019-12-09 NOTE — Assessment & Plan Note (Signed)
Continue with premedication with ODT zofran for now. Can try phenergan if appears to be unresolved.

## 2019-12-10 ENCOUNTER — Telehealth: Payer: Self-pay | Admitting: Infectious Diseases

## 2019-12-10 ENCOUNTER — Telehealth: Payer: Self-pay | Admitting: Student

## 2019-12-10 DIAGNOSIS — Z5181 Encounter for therapeutic drug level monitoring: Secondary | ICD-10-CM

## 2019-12-10 LAB — CBC
HCT: 20.3 % — ABNORMAL LOW (ref 35.0–45.0)
Hemoglobin: 6.8 g/dL — ABNORMAL LOW (ref 11.7–15.5)
MCH: 30.5 pg (ref 27.0–33.0)
MCHC: 33.5 g/dL (ref 32.0–36.0)
MCV: 91 fL (ref 80.0–100.0)
MPV: 10.2 fL (ref 7.5–12.5)
Platelets: 279 10*3/uL (ref 140–400)
RBC: 2.23 10*6/uL — ABNORMAL LOW (ref 3.80–5.10)
RDW: 17.7 % — ABNORMAL HIGH (ref 11.0–15.0)
WBC: 3.7 10*3/uL — ABNORMAL LOW (ref 3.8–10.8)

## 2019-12-10 LAB — SEDIMENTATION RATE: Sed Rate: 60 mm/h — ABNORMAL HIGH (ref 0–20)

## 2019-12-10 LAB — C-REACTIVE PROTEIN: CRP: 10.6 mg/L — ABNORMAL HIGH (ref <8.0)

## 2019-12-10 NOTE — Telephone Encounter (Signed)
I called to tried to reach [place]. Her boyfriend picked up initially and said she was feeling badly today with nausea and was still in bed and to call back after 11:00. I tried to reach her twice and unable to leave a voicemail. Will send her a mychart message and route to Triage team to try to get her on the phone this afternoon.   Her hemoglobin came back 6.8 - would like to know if she is having any dark bowel movements (history of GI bleed in hospital).  If so I would recommend she go back to the emergency room for evaluation and care. If she is feeling very tired, fatigued and cold that is all due to the low hemoglobin and a sign she needs to go to ER also.   There is a chance the low hemoglobin may be a side effect from antibiotics, however with normal platelet count I worry more about GIB and feel we need to rule that out first. If she declines ER would see if she can go for POC HGB and fecal blood test with PCP Monday.   I do not see where she is still on any pantoprazole from D/C. Just started celebrex 1 week ago - would recommend to stop that and continue with Tylenol only.

## 2019-12-10 NOTE — Telephone Encounter (Signed)
-----   Message from Sanjuan Dame, MD sent at 12/10/2019  2:29 PM EDT ----- Regarding: Appointment Good afternoon!  I previously saw this patient in clinic and she also sees ID outpatient. Given her blood work this week, she needs to be seen soon. We have tried to convince her to go to the Emergency Department, but she has declined. Could we call her to schedule an appointment for Monday if available? I appreciate the help!!  Thanks, Sanjuan Dame

## 2019-12-10 NOTE — Telephone Encounter (Signed)
Please refer to message below.  Just spoke with patient and she agreed to an appointment for this coming Monday 12/13/2019 at 1:15 pm.

## 2019-12-10 NOTE — Telephone Encounter (Signed)
Spoke with patient earlier today, states she is feeling fine, denies any shortness of breath. Says she does feel tired at times. Advised patient of Stephanie's recommendations to be evaluated in the ED, patient refused. Also advised patient to stop Celebrex and continue Tylenol only. Patient verbalized understanding.  Eugenia Mcalpine

## 2019-12-10 NOTE — Progress Notes (Signed)
CBC low - see phone note.  CRP and ESR coming down nicely. Continue antibiotics. Follow up TTE in 2 weeks.

## 2019-12-13 ENCOUNTER — Ambulatory Visit (INDEPENDENT_AMBULATORY_CARE_PROVIDER_SITE_OTHER): Payer: Self-pay | Admitting: Student

## 2019-12-13 ENCOUNTER — Other Ambulatory Visit: Payer: Self-pay

## 2019-12-13 ENCOUNTER — Other Ambulatory Visit: Payer: Self-pay | Admitting: Internal Medicine

## 2019-12-13 ENCOUNTER — Telehealth: Payer: Self-pay | Admitting: *Deleted

## 2019-12-13 ENCOUNTER — Encounter: Payer: Self-pay | Admitting: Student

## 2019-12-13 VITALS — BP 131/78 | HR 116 | Temp 98.2°F | Ht 66.0 in | Wt 113.9 lb

## 2019-12-13 DIAGNOSIS — D62 Acute posthemorrhagic anemia: Secondary | ICD-10-CM

## 2019-12-13 DIAGNOSIS — F1190 Opioid use, unspecified, uncomplicated: Secondary | ICD-10-CM

## 2019-12-13 DIAGNOSIS — F321 Major depressive disorder, single episode, moderate: Secondary | ICD-10-CM

## 2019-12-13 DIAGNOSIS — I079 Rheumatic tricuspid valve disease, unspecified: Secondary | ICD-10-CM

## 2019-12-13 DIAGNOSIS — K254 Chronic or unspecified gastric ulcer with hemorrhage: Secondary | ICD-10-CM

## 2019-12-13 LAB — CBC WITH DIFFERENTIAL/PLATELET
Abs Immature Granulocytes: 0.01 10*3/uL (ref 0.00–0.07)
Basophils Absolute: 0 10*3/uL (ref 0.0–0.1)
Basophils Relative: 1 %
Eosinophils Absolute: 0.1 10*3/uL (ref 0.0–0.5)
Eosinophils Relative: 3 %
HCT: 23.6 % — ABNORMAL LOW (ref 36.0–46.0)
Hemoglobin: 7.6 g/dL — ABNORMAL LOW (ref 12.0–15.0)
Immature Granulocytes: 0 %
Lymphocytes Relative: 50 %
Lymphs Abs: 1.9 10*3/uL (ref 0.7–4.0)
MCH: 29.5 pg (ref 26.0–34.0)
MCHC: 32.2 g/dL (ref 30.0–36.0)
MCV: 91.5 fL (ref 80.0–100.0)
Monocytes Absolute: 0.3 10*3/uL (ref 0.1–1.0)
Monocytes Relative: 7 %
Neutro Abs: 1.5 10*3/uL — ABNORMAL LOW (ref 1.7–7.7)
Neutrophils Relative %: 39 %
Platelets: 334 10*3/uL (ref 150–400)
RBC: 2.58 MIL/uL — ABNORMAL LOW (ref 3.87–5.11)
RDW: 17.6 % — ABNORMAL HIGH (ref 11.5–15.5)
WBC: 3.9 10*3/uL — ABNORMAL LOW (ref 4.0–10.5)
nRBC: 0 % (ref 0.0–0.2)

## 2019-12-13 MED ORDER — BUPRENORPHINE HCL-NALOXONE HCL 8-2 MG SL SUBL: 1.0000 | SUBLINGUAL_TABLET | Freq: Every day | SUBLINGUAL | 0 refills | Status: DC

## 2019-12-13 MED ORDER — PANTOPRAZOLE SODIUM 40 MG PO TBEC: DELAYED_RELEASE_TABLET | ORAL | 1 refills | Status: DC

## 2019-12-13 MED FILL — BUPRENORPHIN-NALOXON 8-2 MG: 8-2 | 28 days supply | Qty: 28 | Fill #0

## 2019-12-13 MED FILL — PANTOPRAZOLE SOD DR 40 MG T: 40 | 30 days supply | Qty: 60 | Fill #0

## 2019-12-13 NOTE — Assessment & Plan Note (Signed)
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 She is seeing ID for this. Currently on outpatient linezolid monotherapy. Next visit as per patient is in 1 month.

## 2019-12-13 NOTE — Progress Notes (Signed)
CC: f/u for low hemoglobin (6.8)  HPI:  [name] is a 29 y.o. 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 Please see individualized A&P for full HPI.  Past Medical History:  Diagnosis Date  . 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 Complication of anesthesia   . Depression   . Lipoma of neck 04/2017   posterior  . PONV (postoperative nausea and vomiting)   . Scoliosis    idiopathic   . Sepsis ([place])   . WPW (Wolff-Parkinson-White syndrome)    Current Outpatient Medications on File Prior to Visit  Medication Sig Dispense Refill  . celecoxib (CELEBREX) 100 MG capsule Take 1 capsule (100 mg total) by mouth 2 (two) times daily. (Patient not taking: Reported on 12/09/2019) 60 capsule 0  . dextroamphetamine (DEXTROSTAT) 10 MG tablet Take 10 mg by mouth daily as needed (ADHD).     Marland Kitchen gabapentin (NEURONTIN) 400 MG capsule Take 1 capsule (400 mg total) by mouth at bedtime. 90 capsule 1  . linezolid (ZYVOX) 600 MG tablet Take 1 tablet (600 mg total) by mouth 2 (two) times daily. 60 tablet 0  . methocarbamol (ROBAXIN) 500 MG tablet Take 1 tablet (500 mg total) by mouth every 6 (six) hours as needed for muscle spasms. 30 tablet 0  . ondansetron (ZOFRAN ODT) 4 MG disintegrating tablet Take 1 tablet (4 mg total) by mouth every 8 (eight) hours as needed for nausea or vomiting. 30 tablet 0   No current facility-administered medications on file prior to visit.   No Known Allergies Family History  Problem Relation Age of Onset  . Other Brother        substance abuse  . Hyperlipidemia Maternal Grandfather   . Anxiety disorder Maternal Grandfather   . Alcoholism Maternal Grandfather   . Anxiety  disorder Mother   . ADD / ADHD Father   . Drug abuse Father   . ADD / ADHD Brother   . Other Brother        bone marrow transplant  . Breast cancer Paternal Grandmother   . Aplastic anemia Brother   . Hypertension Maternal Grandmother   . Alcoholism Maternal Grandmother   . Diabetes Paternal Grandfather   . Cancer Paternal Grandfather   . Arrhythmia Brother   . Alcoholism Other   . Alcoholism Maternal Uncle    Social History   Socioeconomic History  . Marital status: Single    Spouse name: Not on file  . Number of children: Not on file  . Years of education: Not on file  . Highest education level: Not on file  Occupational History  . Not on file  Tobacco Use  . Smoking status: Former Smoker    Packs/day: 0.00    Years: 7.00    Pack years: 0.00    Types: Cigarettes  . Smokeless tobacco: Never Used  . Tobacco comment: vapes daily   Vaping Use  . Vaping Use: Every day  Substance and Sexual Activity  . Alcohol use: Not Currently    Alcohol/week: 1.0 standard drink    Types: 1 Glasses of wine per week  . Drug use: Not Currently    Types: Marijuana, IV, Heroin    Comment: last used 04/13/2017  .  Sexual activity: Yes    Partners: Male    Birth control/protection: None  Other Topics Concern  . 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 Difficulty of Paying Living Expenses: Not on file  Food Insecurity:   . Worried About Charity fundraiser in the Last Year: Not on file  . Ran Out of Food in the Last Year: Not on file  Transportation Needs:   . Lack of Transportation (Medical): Not on file  . Lack of Transportation (Non-Medical): Not on file  Physical Activity:   . Days of Exercise per Week: Not on file  . Minutes of Exercise per Session: Not on file  Stress:   . Feeling of Stress : Not on file  Social Connections:   .  Frequency of Communication with Friends and Family: Not on file  . Frequency of Social Gatherings with Friends and Family: Not on file  . Attends Religious Services: Not on file  . Active Member of Clubs or Organizations: Not on file  . Attends Archivist Meetings: Not on file  . Marital Status: Not on file  Intimate Partner Violence:   . Fear of Current or Ex-Partner: Not on file  . Emotionally Abused: Not on file  . Physically Abused: Not on file  . Sexually Abused: Not on file     Review of Systems:   Denies dizziness, weakness, blood in stool, dark stools, N/V, hematemesis.  Physical Exam:  Vitals:   12/13/19 1337  BP: 131/78  SpO2: 98%  Weight: 113 lb 14.4 oz (51.7 kg)  Height: 5\' 6"  (1.676 m)   Physical Exam Constitutional:      Comments: Thin young female, fidgeting in chair.  HENT:     Mouth/Throat:     Mouth: Mucous membranes are moist.     Pharynx: Oropharynx is clear. No oropharyngeal exudate.  Eyes:     Extraocular Movements: Extraocular movements intact.     Pupils: Pupils are equal, round, and reactive to light.     Comments: Pale conjunctiva.  Cardiovascular:     Rate and Rhythm: Normal rate and regular rhythm.     Pulses: Normal pulses.     Heart sounds: Normal heart sounds. No murmur heard.  No friction rub. No gallop.   Pulmonary:     Effort: Pulmonary effort is normal.     Breath sounds: Normal breath sounds. No wheezing, rhonchi or rales.  Abdominal:     General: Bowel sounds are normal. There is no distension.     Palpations: Abdomen is soft.     Tenderness: There is no abdominal tenderness. There is no guarding or rebound.  Musculoskeletal:        General: No swelling. Normal range of motion.  Skin:    General: Skin is warm and dry.     Capillary Refill: Capillary refill takes less than 2 seconds.     Coloration: Skin is pale.  Neurological:     General: No focal deficit present.     Mental Status: She is alert and oriented to  person, place, and time.  Psychiatric:        Mood and Affect: Mood normal.        Behavior: Behavior normal.      Assessment & Plan:   See Encounters Tab for problem  based charting.  Patient seen with Dr. Heber Mandan

## 2019-12-13 NOTE — Assessment & Plan Note (Addendum)
Patient with history of GI bleed (hematochezia) discovered during recent admission on 10/23/19. EGD performed showing mallory-weiss tear, multiple duodenal ulcers, and 3 gastric ulcers. On 12/09/19, she had a CBC done showing hgb 6.8. Repeat CBC today showing Hgb 7.6, and patient is denying any symptoms from anemia. Therefore, low suspicion that she is actively bleeding at this time.  - prescribed protonix 40mg  BID for 1 month then 40mg  daily - emphasized to the patient that if she has any symptoms from anemia (weakness, dizziness, etc) or if she sees blood in her stool/dark stools or blood in vomit then to go to the ED.

## 2019-12-13 NOTE — Patient Instructions (Signed)
Suzanne [name],  It was a pleasure seeing you in the clinic today.   As we discussed, your blood counts on our lab results are low but they are higher than what they were on 12/09/19. If you feel any symptoms like dizziness, weakness, or if you see blood in your stool or dark stools, then please give our clinic a call or go to the Emergency Department.  We have prescribed two medications for you to take at home. One is protonix, which will help heal your ulcers. The second is the suboxone, which will help curb your cravings. Both of these have been sent to your pharmacy.  Please call our clinic at [phone] if you have any questions or concerns. The best time to call is Monday-Friday from 9am-4pm, but there is someone available 24/7 at the same number. If you need medication refills, please notify your pharmacy one week in advance and they will send [place] a request.   Thank you for letting us take part in your care. We look forward to seeing you next time!

## 2019-12-13 NOTE — Assessment & Plan Note (Signed)
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 She is being followed by ID for this. She report that her last use of opioids was last week. She no longer uses via IV, but instead snorted it last week. She is very interested in suboxone therapy. Discussed at length about initiation of suboxone therapy and what to expect while on it. -Prescribed 2 weeks of sublingual suboxone tablets -biweekly visits for continuation of suboxone therapy for now

## 2019-12-13 NOTE — Telephone Encounter (Signed)
25.00 today from med assist fund- petty cash per dr Allyson Sabal will be used at [facility]

## 2019-12-13 NOTE — Assessment & Plan Note (Signed)
PHQ-9 of 16, with daily anhedonia, insomnia, feeling tired, and poor appetite. No homicidal or suicidal ideation. Her home lexapro is currently being held due to suspected interaction with linezolid which is being used for treatment of her MSSA bacteremia. -Plan to restart lexapro once linezolid therapy is finished

## 2019-12-20 ENCOUNTER — Emergency Department (HOSPITAL_COMMUNITY): Payer: Medicaid Other

## 2019-12-20 ENCOUNTER — Other Ambulatory Visit: Payer: Self-pay

## 2019-12-20 ENCOUNTER — Emergency Department (HOSPITAL_COMMUNITY)
Admission: EM | Admit: 2019-12-20 | Discharge: 2019-12-20 | Disposition: A | Discharge status: Home / Self Care | Payer: Medicaid Other | Attending: Emergency Medicine | Admitting: Emergency Medicine

## 2019-12-20 DIAGNOSIS — I456 Pre-excitation syndrome: Secondary | ICD-10-CM | POA: Insufficient documentation

## 2019-12-20 DIAGNOSIS — R0789 Other chest pain: Secondary | ICD-10-CM | POA: Insufficient documentation

## 2019-12-20 DIAGNOSIS — Z87891 Personal history of nicotine dependence: Secondary | ICD-10-CM | POA: Insufficient documentation

## 2019-12-20 DIAGNOSIS — R1084 Generalized abdominal pain: Secondary | ICD-10-CM | POA: Insufficient documentation

## 2019-12-20 DIAGNOSIS — Z20822 Contact with and (suspected) exposure to covid-19: Secondary | ICD-10-CM | POA: Insufficient documentation

## 2019-12-20 DIAGNOSIS — J029 Acute pharyngitis, unspecified: Secondary | ICD-10-CM | POA: Insufficient documentation

## 2019-12-20 DIAGNOSIS — R5383 Other fatigue: Secondary | ICD-10-CM | POA: Insufficient documentation

## 2019-12-20 DIAGNOSIS — R0602 Shortness of breath: Secondary | ICD-10-CM | POA: Insufficient documentation

## 2019-12-20 DIAGNOSIS — R112 Nausea with vomiting, unspecified: Secondary | ICD-10-CM | POA: Insufficient documentation

## 2019-12-20 LAB — CBC
HCT: 26.6 % — ABNORMAL LOW (ref 36.0–46.0)
Hemoglobin: 8.2 g/dL — ABNORMAL LOW (ref 12.0–15.0)
MCH: 28.7 pg (ref 26.0–34.0)
MCHC: 30.8 g/dL (ref 30.0–36.0)
MCV: 93 fL (ref 80.0–100.0)
Platelets: 254 10*3/uL (ref 150–400)
RBC: 2.86 MIL/uL — ABNORMAL LOW (ref 3.87–5.11)
RDW: 17.4 % — ABNORMAL HIGH (ref 11.5–15.5)
WBC: 4.7 10*3/uL (ref 4.0–10.5)
nRBC: 0 % (ref 0.0–0.2)

## 2019-12-20 LAB — TROPONIN I (HIGH SENSITIVITY)
Troponin I (High Sensitivity): 3 ng/L (ref <18)
Troponin I (High Sensitivity): <2 ng/L (ref <18)

## 2019-12-20 LAB — HEPATIC FUNCTION PANEL
ALT: 15 U/L (ref 0–44)
AST: 20 U/L (ref 15–41)
Albumin: 4.3 g/dL (ref 3.5–5.0)
Alkaline Phosphatase: 69 U/L (ref 38–126)
Bilirubin, Direct: <0.1 mg/dL (ref 0.0–0.2)
Total Bilirubin: 0.6 mg/dL (ref 0.3–1.2)
Total Protein: 7.2 g/dL (ref 6.5–8.1)

## 2019-12-20 LAB — TYPE AND SCREEN
ABO/RH(D): A NEG
Antibody Screen: NEGATIVE

## 2019-12-20 LAB — RESPIRATORY PANEL BY RT PCR (FLU A&B, COVID)
Influenza A by PCR: NEGATIVE
Influenza B by PCR: NEGATIVE
SARS Coronavirus 2 by RT PCR: NEGATIVE

## 2019-12-20 LAB — POC OCCULT BLOOD, ED: Fecal Occult Bld: NEGATIVE

## 2019-12-20 LAB — BASIC METABOLIC PANEL
Anion gap: 13 (ref 5–15)
BUN: 12 mg/dL (ref 6–20)
CO2: 21 mmol/L — ABNORMAL LOW (ref 22–32)
Calcium: 9.6 mg/dL (ref 8.9–10.3)
Chloride: 105 mmol/L (ref 98–111)
Creatinine, Ser: 0.86 mg/dL (ref 0.44–1.00)
GFR, Estimated: >60 mL/min (ref >60)
Glucose, Bld: 115 mg/dL — ABNORMAL HIGH (ref 70–99)
Potassium: 3.8 mmol/L (ref 3.5–5.1)
Sodium: 139 mmol/L (ref 135–145)

## 2019-12-20 LAB — I-STAT BETA HCG BLOOD, ED (MC, WL, AP ONLY): I-stat hCG, quantitative: <5 m[IU]/mL (ref <5)

## 2019-12-20 LAB — LIPASE, BLOOD: Lipase: 28 U/L (ref 11–51)

## 2019-12-20 MED ORDER — ALUM & MAG HYDROXIDE-SIMETH 200-200-20 MG/5ML PO SUSP
30.0000 mL | Freq: Once | ORAL | Status: AC
  Administered 2019-12-20: 30 mL via ORAL
  Filled 2019-12-20: qty 30

## 2019-12-20 MED ORDER — METHOCARBAMOL 500 MG PO TABS
500.0000 mg | ORAL_TABLET | Freq: Once | ORAL | Status: AC
  Administered 2019-12-20: 500 mg via ORAL
  Filled 2019-12-20: qty 1

## 2019-12-20 MED ORDER — PROMETHAZINE HCL 25 MG PO TABS: 25.0000 mg | ORAL_TABLET | Freq: Four times a day (QID) | ORAL | 0 refills | Status: AC | PRN

## 2019-12-20 MED ORDER — PROMETHAZINE HCL 25 MG PO TABS
25.0000 mg | ORAL_TABLET | Freq: Once | ORAL | Status: AC
  Administered 2019-12-20: 25 mg via ORAL
  Filled 2019-12-20: qty 1

## 2019-12-20 MED ORDER — LACTATED RINGERS IV BOLUS
500.0000 mL | Freq: Once | INTRAVENOUS | Status: AC
  Administered 2019-12-20: 500 mL via INTRAVENOUS

## 2019-12-20 MED ORDER — LIDOCAINE VISCOUS HCL 2 % MT SOLN
15.0000 mL | Freq: Once | OROMUCOSAL | Status: AC
  Administered 2019-12-20: 15 mL via ORAL
  Filled 2019-12-20: qty 15

## 2019-12-20 MED ORDER — ACETAMINOPHEN 325 MG PO TABS
650.0000 mg | ORAL_TABLET | Freq: Once | ORAL | Status: AC
  Administered 2019-12-20: 650 mg via ORAL
  Filled 2019-12-20: qty 2

## 2019-12-20 NOTE — ED Notes (Signed)
Pt ambulated with on room air. Oxygen saturation fluctuated between 96%-100%. Pt c/o dizziness while walking.

## 2019-12-20 NOTE — Discharge Instructions (Addendum)
 ?  Suzanne [name]:  Thank you for allowing [place] to take care of you today.  We hope you begin feeling better soon.  To-Do: Please follow-up with your primary doctor or call  to schedule an appointment with a new primary care doctor Please follow up with your cardiologist and schedule your follow up Echo  Please take phenergan as needed for nausea/vomiting Please return to the Emergency Department or call 911 if you experience chest pain, shortness of breath, severe pain, severe fever, altered mental status, or have any reason to think that you need emergency medical care.  Thank you again.  Hope you feel better soon.

## 2019-12-20 NOTE — ED Notes (Signed)
Pt refusing to get IV done. She said she is well known to the IV team as a hard stick.

## 2019-12-20 NOTE — ED Provider Notes (Addendum)
[facility] EMERGENCY DEPARTMENT Provider Note   CSN: [id number] Arrival date & time: 12/20/19  0847     History No chief complaint on file.   Elfreida Heggs is a 29 y.o. female.  The history is provided by the patient.  Illness Location:  Generalized fatigue, abdominal pain, vomiting, SOB and CP with exertion Severity:  Moderate Onset quality:  Gradual Duration:  1 week Timing:  Constant Progression:  Unchanged Chronicity:  New Context:  Recent hospitalization  Associated symptoms: abdominal pain, chest pain, shortness of breath, sore throat and vomiting   Associated symptoms: no congestion, no cough, no diarrhea, no fever and no headaches        Past Medical History:  Diagnosis Date  . 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 Complication of anesthesia   . Depression   . Lipoma of neck 04/2017   posterior  . PONV (postoperative nausea and vomiting)   . Scoliosis    idiopathic   . Sepsis ([place])   . WPW (Wolff-Parkinson-White syndrome)     Patient Active Problem List   Diagnosis Date Noted  . Nausea & vomiting 12/09/2019  . Drug interaction 12/09/2019  . Infection of shoulder ([place]) 11/19/2019  . Complete tear of right rotator cuff 11/18/2019  . Septic arthritis of shoulder, right ([place]) 11/18/2019  . Opioid use disorder 11/11/2019  . GI bleed 10/25/2019  . Gastric ulcer with hemorrhage   . 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 Acute septic pulmonary embolism ([place]) 10/20/2019  . Irritability 03/29/2016  . Sinus tachycardia 08/07/2015  . SVT (supraventricular tachycardia) ([place]) 05/17/2015  . Major depression 05/03/2015  . Wolff-Parkinson-White syndrome 05/18/2014  . Smoker 05/18/2014  . ADD (attention deficit disorder) 08/18/2013    Past Surgical History:  Procedure Laterality Date  . BIOPSY  10/23/2019   Procedure: BIOPSY;  Surgeon: Juanita Craver, MD;  Location: [facility] ENDOSCOPY;  Service: Endoscopy;;  .  COLPOSCOPY  01/2010 & 02/2011    both were negative   . ELECTROPHYSIOLOGIC STUDY N/A 05/17/2015   SVT ablation by Dr Curt Bears  . ESOPHAGOGASTRODUODENOSCOPY (EGD) WITH PROPOFOL N/A 10/23/2019   Procedure: ESOPHAGOGASTRODUODENOSCOPY (EGD) WITH PROPOFOL;  Surgeon: Juanita Craver, MD;  Location: [facility] ENDOSCOPY;  Service: Endoscopy;  Laterality: N/A;  . LIPOMA EXCISION N/A 05/07/2017   Procedure: EXCISION LIPOMA POSTERIOR NECK ERAS PATHWAY;  Surgeon: Erroll Luna, MD;  Location: [facility];  Service: General;  Laterality: N/A;  . SHOULDER ARTHROSCOPY Right 11/19/2019   Procedure: RIGHT SHOULDER ARTHROSCOPY, DEBRIDEMENT STIMULAN BEAD PLACEMENT;  Surgeon: Meredith Pel, MD;  Location: [facility];  Service: Orthopedics;  Laterality: Right;  . TEE WITHOUT CARDIOVERSION N/A 10/22/2019   Procedure: TRANSESOPHAGEAL ECHOCARDIOGRAM (TEE);  Surgeon: Jerline Pain, MD;  Location: [facility] ENDOSCOPY;  Service: Cardiovascular;  Laterality: N/A;  . WISDOM TOOTH EXTRACTION       OB History    Gravida  0   Para      Term      Preterm      AB      Living        SAB      TAB      Ectopic      Multiple      Live Births              Family History  Problem Relation Age of Onset  . Other Brother        substance abuse  . Hyperlipidemia Maternal Grandfather   .  Anxiety disorder Maternal Grandfather   . Alcoholism Maternal Grandfather   . Anxiety disorder Mother   . ADD / ADHD Father   . Drug abuse Father   . ADD / ADHD Brother   . Other Brother        bone marrow transplant  . Breast cancer Paternal Grandmother   . Aplastic anemia Brother   . Hypertension Maternal Grandmother   . Alcoholism Maternal Grandmother   . Diabetes Paternal Grandfather   . Cancer Paternal Grandfather   . Arrhythmia Brother   . Alcoholism Other   . Alcoholism Maternal Uncle     Social History   Tobacco Use  . Smoking status: Former Smoker    Packs/day: 0.00    Years: 7.00    Pack years: 0.00      Types: Cigarettes  . Smokeless tobacco: Never Used  . Tobacco comment: vapes daily   Vaping Use  . Vaping Use: Every day  Substance Use Topics  . Alcohol use: Not Currently    Alcohol/week: 1.0 standard drink    Types: 1 Glasses of wine per week  . Drug use: Not Currently    Types: Marijuana, IV, Heroin    Comment: last used 04/13/2017    Home Medications Prior to Admission medications   Medication Sig Start Date End Date Taking? Authorizing Provider  celecoxib (CELEBREX) 100 MG capsule Take 1 capsule (100 mg total) by mouth 2 (two) times daily. 11/30/19 11/29/20 Yes Magnant, Charles L, PA-C  dextroamphetamine (DEXTROSTAT) 10 MG tablet Take 10 mg by mouth daily as needed (ADHD).    Yes [provider]  gabapentin (NEURONTIN) 400 MG capsule Take 1 capsule (400 mg total) by mouth at bedtime. 12/31/17  Yes Rutherford Guys, MD  linezolid (ZYVOX) 600 MG tablet Take 1 tablet (600 mg total) by mouth 2 (two) times daily. 11/29/19  Yes [place] Callas, NP  norethindrone (MICRONOR) 0.35 MG tablet Take 1 tablet by mouth daily. 11/22/19  Yes [provider]  pantoprazole (PROTONIX) 40 MG tablet Take 1 tablet (40 mg total) by mouth 2 (two) times daily for 30 days, THEN 1 tablet (40 mg total) daily. 12/13/19 03/12/20 Yes Lucious Groves, DO  buprenorphine-naloxone (SUBOXONE) 8-2 mg SUBL SL tablet Place 1 tablet under the tongue daily. Patient not taking: Reported on 12/20/2019 12/13/19   Lucious Groves, DO  methocarbamol (ROBAXIN) 500 MG tablet Take 1 tablet (500 mg total) by mouth every 6 (six) hours as needed for muscle spasms. Patient not taking: Reported on 12/20/2019 11/21/19   Meredith Pel, MD  ondansetron (ZOFRAN ODT) 4 MG disintegrating tablet Take 1 tablet (4 mg total) by mouth every 8 (eight) hours as needed for nausea or vomiting. Patient not taking: Reported on 12/20/2019 11/29/19   [place] Callas, NP    Allergies    Patient has no known  allergies.  Review of Systems   Review of Systems  Constitutional: Positive for activity change, appetite change and chills. Negative for fever.  HENT: Positive for sore throat. Negative for congestion.   Respiratory: Positive for shortness of breath. Negative for cough.   Cardiovascular: Positive for chest pain. Negative for leg swelling.  Gastrointestinal: Positive for abdominal pain and vomiting. Negative for blood in stool and diarrhea.       Dark stool  Genitourinary: Negative for difficulty urinating and vaginal bleeding.  Musculoskeletal: Positive for arthralgias. Negative for back pain and neck pain.       Shoulder pain  Neurological: Positive for weakness. Negative for headaches.  All other systems reviewed and are negative.   Physical Exam Updated Vital Signs BP 121/90   Pulse 99   Temp 98.5 F (36.9 C) (Oral)   Resp 18   Ht 5\' 6"  (1.676 m)   Wt 53.5 kg   SpO2 100%   BMI 19.05 kg/m   Physical Exam Vitals reviewed.  Constitutional:      General: She is not in acute distress.    Appearance: Normal appearance.     Comments: pale  HENT:     Head: Normocephalic and atraumatic.     Nose: Nose normal.     Mouth/Throat:     Mouth: Mucous membranes are moist.     Pharynx: Oropharynx is clear.  Eyes:     Conjunctiva/sclera: Conjunctivae normal.  Cardiovascular:     Rate and Rhythm: Tachycardia present.     Heart sounds: Normal heart sounds.  Pulmonary:     Effort: Pulmonary effort is normal. No respiratory distress.     Breath sounds: Normal breath sounds. No wheezing, rhonchi or rales.  Abdominal:     General: Abdomen is flat. There is no distension.     Palpations: Abdomen is soft.     Tenderness: There is no abdominal tenderness.  Musculoskeletal:     Cervical back: Neck supple. No tenderness.     Right lower leg: No edema.     Left lower leg: No edema.  Skin:    General: Skin is warm and dry.  Neurological:     Mental Status: She is alert.   Psychiatric:        Mood and Affect: Mood normal.        Behavior: Behavior normal.     ED Results / Procedures / Treatments   Labs (all labs ordered are listed, but only abnormal results are displayed) Labs Reviewed  BASIC METABOLIC PANEL - Abnormal; Notable for the following components:      Result Value   CO2 21 (*)    Glucose, Bld 115 (*)    All other components within normal limits  CBC - Abnormal; Notable for the following components:   RBC 2.86 (*)    Hemoglobin 8.2 (*)    HCT 26.6 (*)    RDW 17.4 (*)    All other components within normal limits  RESPIRATORY PANEL BY RT PCR (FLU A&B, COVID)  HEPATIC FUNCTION PANEL  LIPASE, BLOOD  I-STAT BETA HCG BLOOD, ED ([facility], [facility], [facility] ONLY)  POC OCCULT BLOOD, ED  TYPE AND SCREEN  TROPONIN I (HIGH SENSITIVITY)  TROPONIN I (HIGH SENSITIVITY)    EKG EKG Interpretation  Date/Time:  Monday December 20 2019 09:00:48 EST Ventricular Rate:  104 PR Interval:  92 QRS Duration: 98 QT Interval:  328 QTC Calculation: 431 R Axis:   44 Text Interpretation: Sinus tachycardia with short PR Left ventricular hypertrophy with repolarization abnormality ( R in aVL ) Wolff-Parkinson-White Non-specific ST-t changes Confirmed by Lajean Saver [phone]) on 12/20/2019 11:32:35 AM   Radiology DG Chest 2 View  Result Date: 12/20/2019 CLINICAL DATA:  Chest pain.  Low hemoglobin. EXAM: CHEST - 2 VIEW COMPARISON:  11/09/2019. FINDINGS: The heart size and mediastinal contours are within normal limits. No consolidation. No visible pleural effusions or pneumothorax. Biapical pleuroparenchymal scarring. The visualized skeletal structures are unremarkable. IMPRESSION: No radiographic evidence of acute cardiopulmonary disease. Electronically Signed   By: Margaretha Sheffield MD   On: 12/20/2019 10:09    Procedures Procedures (  including critical care time)  Medications Ordered in ED Medications  acetaminophen (TYLENOL) tablet 650 mg (650 mg Oral Given 12/20/19  1137)  promethazine (PHENERGAN) tablet 25 mg (25 mg Oral Given 12/20/19 1137)  lactated ringers bolus 500 mL (0 mLs Intravenous Stopped 12/20/19 1414)  alum & mag hydroxide-simeth (MAALOX/MYLANTA) 200-200-20 MG/5ML suspension 30 mL (30 mLs Oral Given 12/20/19 1416)    And  lidocaine (XYLOCAINE) 2 % viscous mouth solution 15 mL (15 mLs Oral Given 12/20/19 1416)  methocarbamol (ROBAXIN) tablet 500 mg (500 mg Oral Given 12/20/19 1416)  lactated ringers bolus 500 mL (500 mLs Intravenous New Bag/Given 12/20/19 1415)    ED Course  I have reviewed the triage vital signs and the nursing notes.  Pertinent labs & imaging results that were available during my care of the patient were reviewed by me and considered in my medical decision making (see chart for details).    MDM Rules/Calculators/A&P                           Medical Decision Making: Michelle Wnek is a 29 y.o. female who presented to the ED today with abdominal pain, fatigue, N/V, and CP/SOB intermittently in setting of recent low Hgb.  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 Pt continues to take linezolid po outpt.   Reviewed and confirmed nursing documentation for past medical history, family history, social history.  On my initial exam, the pt was in NAD, alert and oriented, normal WOB, lungs CTAB, soft non tender abdomen.   CBC and CMP reassuring.  No leukocytosis, pt afebrile, not septic at this time.  Hgb improved from prior, 8.2 (from 7.6). Pt denies blood in vomit or vaginal bleeding Rectal exam unremarkable, no blood or melena, FOB negative.  Repeat abdominal exam re-assuring.   Pt given  fluids, tylenol for pain and phenergan for nausea.   Pt hemodynamically stable, well appearing, soft, non surgical abdomen, do not suspect acute surgical etiology of pt pain at this time.   CXR unremarkable.  Pt saturating appropriately on RA, no tachypnea, no tachycardia on re-evaluation.  The ECG reveals sinus tachy with short PR, LVH, WPW, Non specific ST changes. Pt has known WPW.  Repeat EKG shows sinus, non specific T wave abnormality. No anatomical ischemia representing STEMI, New-Onset Arrhythmia, or ischemic equivalent. To further evaluate for ongoing myocardial ischemia, serial troponins will be ordered. The first of these is  not elevated.  Repeat troponin at 3-hours was also not elevated. Therefore, I do not suspect ACS at this time. Advised pt to follow up with cardiology, pt has plan for cardiology eval outpt and outpt echo pending.   The patient's presentation, the patient being hemodynamically stable, and the ECG are not consistent with Pericardial Tamponade. The patient's pain is not positional. This in conjunction with the lack of PR depressions and ST elevations on the ECG are reassuring against Pericarditis. The patient's non-elevated troponin and ECG are also inconsistent with Myocarditis.  The CXR is unremarkable for focal airspace disease.  The patient is afebrile and denies productive cough.  Therefore, I do not suspect Pneumonia. There is no evidence  of Pneumothorax on physical exam or on the CXR. CXR shows no evidence of Esophageal Tear and there is no recent intractable emesis or esophageal instrumentation. There is no peritonitis or free air on CXR worrisome for a Perforated Abdominal Viscous.  Pt dry appearing on exam, reporting intermittent vomiting for 1 week, zofran use without relief.  After fluids HR improved to 99.   I do not think that the patient has a Pulmonary Embolism. The patient is PERC negative (age < 19, HR < 100, SpO2% > 95%, no unilateral leg swelling, no  hemoptysis, no surgery or trauma requiring anesthesia within the past 4 weeks, no history of prior PE or DVT, and no hormone use).   The patient's pain is not described as tearing and it does not radiate to back. Pulses are present bilaterally in both the upper and lower extremities. CXR does not show a widened mediastinum. I have a very low suspicion for Aortic Dissection.  Pt requesting morphine, no indication for opiates at this time. Per records, pt has hx of opiate abuse and is on daily suboxone.  All radiology and laboratory studies reviewed independently and with my attending physician, agree with reading provided by radiologist unless otherwise noted.   Upon reassessing patient, patient was resting comfortably, in NAD, tolerating PO.  Normal orthostatics, pt safely able to stand to ambulate.    Based on the above findings, I believe patient is hemodynamically stable for discharge   Patient/and family educated about specific return precautions for given chief complaint and symptoms.  Patient/and family educated about follow-up with PCP.  Patient/and family expressed understanding of return precautions and need for follow-up.  Patient discharged.    The above care was discussed with and agreed upon by my attending physician.    Emergency Department Medication Summary:  Medications  acetaminophen (TYLENOL) tablet 650 mg (650 mg Oral Given 12/20/19 1137)  promethazine (PHENERGAN) tablet 25 mg (25 mg Oral Given 12/20/19 1137)  lactated ringers bolus 500 mL (0 mLs Intravenous Stopped 12/20/19 1414)  alum & mag hydroxide-simeth (MAALOX/MYLANTA) 200-200-20 MG/5ML suspension 30 mL (30 mLs Oral Given 12/20/19 1416)    And  lidocaine (XYLOCAINE) 2 % viscous mouth solution 15 mL (15 mLs Oral Given 12/20/19 1416)  methocarbamol (ROBAXIN) tablet 500 mg (500 mg Oral Given 12/20/19 1416)  lactated ringers bolus 500 mL (500 mLs Intravenous New Bag/Given 12/20/19 1415)        Final Clinical  Impression(s) / ED Diagnoses Final diagnoses:  Nausea and vomiting, intractability of vomiting not specified, unspecified vomiting type    Rx / DC Orders ED Discharge Orders    None       Roosevelt Locks, MD 12/20/19 1501    Roosevelt Locks, MD 12/20/19 1517    Lajean Saver, MD 12/21/19 1010

## 2019-12-20 NOTE — ED Triage Notes (Signed)
Pt here from home with abd pain  And low hbg , pt was seen in office 1 week ago and hgb was 7.1, no know bleeding since just does not feel good

## 2019-12-21 NOTE — Progress Notes (Signed)
Internal Medicine Clinic Attending  I saw and evaluated the patient.  I personally confirmed the key portions of the history and exam documented by Dr. Allyson Sabal and I reviewed pertinent patient test results.  The assessment, diagnosis, and plan were formulated together and I agree with the documentation in the resident's note. Hgb improved on recheck, no active blood loss with melena or hematochezia, should be on PPI given PUD on endoscopy.

## 2019-12-30 ENCOUNTER — Encounter: Payer: Self-pay | Admitting: Nurse Practitioner

## 2019-12-30 ENCOUNTER — Other Ambulatory Visit: Payer: Self-pay

## 2019-12-30 ENCOUNTER — Ambulatory Visit (INDEPENDENT_AMBULATORY_CARE_PROVIDER_SITE_OTHER): Payer: Medicaid Other | Admitting: Nurse Practitioner

## 2019-12-30 VITALS — BP 110/74 | HR 136 | Ht 66.0 in | Wt 110.0 lb

## 2019-12-30 DIAGNOSIS — I456 Pre-excitation syndrome: Secondary | ICD-10-CM

## 2019-12-30 DIAGNOSIS — B9561 Methicillin susceptible Staphylococcus aureus infection as the cause of diseases classified elsewhere: Secondary | ICD-10-CM

## 2019-12-30 DIAGNOSIS — R002 Palpitations: Secondary | ICD-10-CM

## 2019-12-30 DIAGNOSIS — F1910 Other psychoactive substance abuse, uncomplicated: Secondary | ICD-10-CM

## 2019-12-30 DIAGNOSIS — I330 Acute and subacute infective endocarditis: Secondary | ICD-10-CM

## 2019-12-30 MED ORDER — DOXYCYCLINE HYCLATE 100 MG PO TABS: 100.0000 mg | ORAL_TABLET | Freq: Two times a day (BID) | ORAL | 0 refills | Status: AC

## 2019-12-30 NOTE — Patient Instructions (Signed)
Medication Instructions:  *If you need a refill on your cardiac medications before your next appointment, please call your pharmacy*  Follow-Up: At [facility], you and your health needs are our priority.  As part of our continuing mission to provide you with exceptional heart care, we have created designated Provider Care Teams.  These Care Teams include your primary Cardiologist (physician) and Advanced Practice Providers (APPs -  Physician Assistants and Nurse Practitioners) who all work together to provide you with the care you need, when you need it.  We recommend signing up for the patient portal called "MyChart".  Sign up information is provided on this After Visit Summary.  MyChart is used to connect with patients for Virtual Visits (Telemedicine).  Patients are able to view lab/test results, encounter notes, upcoming appointments, etc.  Non-urgent messages can be sent to your provider as well.   To learn more about what you can do with MyChart, go to [email or web address].    Your next appointment:   Your physician recommends that you schedule a follow-up appointment as needed.  The format for your next appointment:   Either in person or virtually with Dr. Lovena Le or one of the following Advanced Practice Providers on your designated Care Team:    Chanetta Marshall, NP  Tommye Standard, PA-C  Legrand Como "Jonni Sanger" [place], [place]   Postural Orthostatic Tachycardia Syndrome Postural orthostatic tachycardia syndrome (POTS) is a group of symptoms that occur when a person stands up after lying down. POTS occurs when less blood than normal flows to the body when you stand up. The reduced blood flow to the body makes the heart beat rapidly. POTS may be associated with another medical condition, or it may occur on its own. What are the causes? The cause of this condition is not known, but many conditions and diseases are associated with it. What increases the risk? This condition is more likely to  develop in:  Women 51-50 years old.  Women who are pregnant.  Women who are in their period (menstruating).  People who have certain conditions, such as: ? Infection from a virus. ? Attacks of healthy organs by the body's immunity (autoimmune disease). ? Losing a lot of red blood cells (anemia). ? Losing too much water in the body (dehydration). ? An overactive thyroid (hyperthyroidism).  People who take certain medicines.  People who have had a major injury.  People who have had surgery. What are the signs or symptoms? The most common symptom of this condition is light-headedness when one stands from a lying or sitting position. Other symptoms may include:  Feeling a rapid increase in the heartbeat (tachycardia) within 10 minutes of standing up.  Fainting.  Weakness.  Confusion.  Trembling.  Shortness of breath.  Sweating or flushing.  Headache.  Chest pain.  Breathing that is deeper and faster than normal (hyperventilation).  Nausea.  Anxiety. Symptoms may be worse in the morning, and they may be relieved by lying down. How is this diagnosed? This condition is diagnosed based on:  Your symptoms.  Your medical history.  A physical exam.  Checking your heart rate when you are lying down and after you stand up.  Checking your blood pressure when you go from lying down to standing up.  Blood tests to measure hormones that change with blood pressure. The blood tests will be done when you are lying down and when you are standing up. You may have other tests to check for conditions or diseases that are associated  with POTS. How is this treated? Treatment for this condition depends on how severe your symptoms are and whether you have any conditions or diseases that are associated with POTS. Treatment may involve:  Treating any conditions or diseases that are associated with POTS.  Drinking two glasses of water before getting up from a lying  position.  Eating more salt (sodium).  Taking medicine to control blood pressure and heart rate (beta-blocker).  Avoiding certain medicines.  Starting an exercise program under the supervision of a health care provider. Follow these instructions at home: Medicines  Take over-the-counter and prescription medicines only as told by your health care provider.  Let your health care provider know about all prescription or over-the-counter medicines. These include herbs, vitamins, and supplements. You may need to stop or adjust some medicines if they cause this condition.  Talk with your health care provider before starting any new medicines. Eating and drinking   Drink enough fluid to keep your urine pale yellow.  If told by your health care provider, drink two glasses of water before getting up from a lying position.  Follow instructions from your health care provider about how much sodium you should eat.  Avoid heavy meals. Eat several small meals a day instead of a few large meals. General instructions  Do an aerobic exercise for 20 minutes a day, at least 3 days a week.  Ask your health care provider what kinds of exercise are safe for you.  Do not use any products that contain nicotine or tobacco, such as cigarettes and e-cigarettes. These can interfere with blood flow. If you need help quitting, ask your health care provider.  Keep all follow-up visits as told by your health care provider. This is important. Contact a health care provider if:  Your symptoms do not improve after treatment.  Your symptoms get worse.  You develop new symptoms. Get help right away if:  You have chest pain.  You have difficulty breathing.  You have fainting episodes. These symptoms may represent a serious problem that is an emergency. Do not wait to see if the symptoms will go away. Get medical help right away. Call your local emergency services (911 in the [place] Do not drive yourself to  the hospital. Summary  POTS is a condition that can cause light-headedness, fainting, and palpitations when you go from a sitting or lying position to a standing position. It occurs when less blood than normal flows to the body when you stand up.  Treatment for this condition includes treating any underlying conditions, drinking plenty of water, stopping or changing some medicines, or starting an exercise program.  Get help right away if you have chest pain, difficulty breathing, or fainting episodes. These may represent a serious problem that is an emergency. This information is not intended to replace advice given to you by your health care provider. Make sure you discuss any questions you have with your health care provider. Document Revised: 03/11/2017 Document Reviewed: 03/11/2017 Elsevier Patient Education  2020 [employer].

## 2019-12-30 NOTE — Addendum Note (Signed)
Addended by: [place] Callas on: 12/30/2019 01:00 PM   Modules accepted: Orders

## 2019-12-30 NOTE — Progress Notes (Signed)
Electrophysiology Office Note Date: 12/30/2019  ID:  Suzanne [name], DOB July 14, 1990, MRN [id number]  PCP: Sanjuan Dame, MD Electrophysiologist: Lovena Le  CC: palpitations follow up  Suzanne [name] is a 29 y.o. female seen today for Dr Lovena Le.  She presents today for routine electrophysiology followup. In September of 2021 she was admitted for chest pain and fever.  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  That hospitalization was complicated by GI bleeding and she unable to undergo debulking of tricuspid valve. She was treated with IV antibiotics. She then was admitted in October of this year with right shoulder infection and was found to have septic arthritis.   Since that time, she continues to struggle with deconditioning, dizziness, and pre-syncope with standing.  Her mom is on speakerphone during the visit today.  She stopped her antibiotics 2/2 nausea and vomiting. She has follow up scheduled with ID. She denies chest pain,  PND, orthopnea, syncope, edema, weight gain, or early satiety.  Past Medical History:  Diagnosis Date  . 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 Complication of anesthesia   . Depression   . Lipoma of neck 04/2017   posterior  . PONV (postoperative nausea and vomiting)   . Scoliosis    idiopathic   . Sepsis ([place])   . WPW (Wolff-Parkinson-White syndrome)    Past Surgical History:  Procedure Laterality Date  . BIOPSY  10/23/2019   Procedure: BIOPSY;  Surgeon: Juanita Craver, MD;  Location[facility] ENDOSCOPY;  Service: Endoscopy;;  . COLPOSCOPY  01/2010 & 02/2011    both were negative   . ELECTROPHYSIOLOGIC STUDY N/A 05/17/2015   SVT ablation by Dr Curt Bears  . ESOPHAGOGASTRODUODENOSCOPY (EGD) WITH PROPOFOL N/A 10/23/2019   Procedure: ESOPHAGOGASTRODUODENOSCOPY (EGD) WITH PROPOFOL;  Surgeon: Juanita Craver, MD;  Location: [facility] ENDOSCOPY;  Service: Endoscopy;  Laterality: N/A;  . LIPOMA EXCISION N/A 05/07/2017   Procedure: EXCISION  LIPOMA POSTERIOR NECK ERAS PATHWAY;  Surgeon: Erroll Luna, MD;  Location: [place];  Service: General;  Laterality: N/A;  . SHOULDER ARTHROSCOPY Right 11/19/2019   Procedure: RIGHT SHOULDER ARTHROSCOPY, DEBRIDEMENT STIMULAN BEAD PLACEMENT;  Surgeon: Meredith Pel, MD;  Location: [place];  Service: Orthopedics;  Laterality: Right;  . TEE WITHOUT CARDIOVERSION N/A 10/22/2019   Procedure: TRANSESOPHAGEAL ECHOCARDIOGRAM (TEE);  Surgeon: Jerline Pain, MD;  Location: [facility] ENDOSCOPY;  Service: Cardiovascular;  Laterality: N/A;  . WISDOM TOOTH EXTRACTION      Current Outpatient Medications  Medication Sig Dispense Refill  . celecoxib (CELEBREX) 100 MG capsule Take 1 capsule (100 mg total) by mouth 2 (two) times daily. 60 capsule 0  . dextroamphetamine (DEXTROSTAT) 10 MG tablet Take 10 mg by mouth daily as needed (ADHD).     Marland Kitchen gabapentin (NEURONTIN) 400 MG capsule Take 1 capsule (400 mg total) by mouth at bedtime. 90 capsule 1  . linezolid (ZYVOX) 600 MG tablet Take 1 tablet (600 mg total) by mouth 2 (two) times daily. 60 tablet 0  . methocarbamol (ROBAXIN) 500 MG tablet Take 1 tablet (500 mg total) by mouth every 6 (six) hours as needed for muscle spasms. 30 tablet 0  . norethindrone (MICRONOR) 0.35 MG tablet Take 1 tablet by mouth daily.    . ondansetron (ZOFRAN ODT) 4 MG disintegrating tablet Take 1 tablet (4 mg total) by mouth every 8 (eight) hours as needed for nausea or vomiting. 30 tablet 0  . pantoprazole (PROTONIX) 40 MG tablet Take 1 tablet (40 mg total)  by mouth 2 (two) times daily for 30 days, THEN 1 tablet (40 mg total) daily. 60 tablet 1  . promethazine (PHENERGAN) 25 MG tablet Take 1 tablet (25 mg total) by mouth every 6 (six) hours as needed for up to 4 days for nausea or vomiting. 15 tablet 0   No current facility-administered medications for this visit.    Allergies:   Patient has no known allergies.   Social History: Social History   Socioeconomic History    . Marital status: Single    Spouse name: Not on file  . Number of children: Not on file  . Years of education: Not on file  . Highest education level: Not on file  Occupational History  . Not on file  Tobacco Use  . Smoking status: Former Smoker    Packs/day: 0.00    Years: 7.00    Pack years: 0.00    Types: Cigarettes  . Smokeless tobacco: Never Used  . Tobacco comment: vapes daily   Vaping Use  . Vaping Use: Every day  Substance and Sexual Activity  . Alcohol use: Not Currently    Alcohol/week: 1.0 standard drink    Types: 1 Glasses of wine per week  . Drug use: Not Currently    Types: Marijuana, IV, Heroin    Comment: last used 04/13/2017  . Sexual activity: Yes    Partners: Male    Birth control/protection: None  Other Topics Concern  . 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 Difficulty of Paying Living Expenses: Not on file  Food Insecurity:   . Worried About Charity fundraiser in the Last Year: Not on file  . Ran Out of Food in the Last Year: Not on file  Transportation Needs:   . Lack of Transportation (Medical): Not on file  . Lack of Transportation (Non-Medical): Not on file  Physical Activity:   . Days of Exercise per Week: Not on file  . Minutes of Exercise per Session: Not on file  Stress:   . Feeling of Stress : Not on file  Social Connections:   . Frequency of Communication with Friends and Family: Not on file  . Frequency of Social Gatherings with Friends and Family: Not on file  . Attends Religious Services: Not on file  . Active Member of Clubs or Organizations: Not on file  . Attends Archivist Meetings: Not on file  . Marital Status: Not on file  Intimate Partner Violence:   . Fear of Current or Ex-Partner: Not on file  . Emotionally Abused: Not on file  . Physically Abused: Not on file  .  Sexually Abused: Not on file    Family History: Family History  Problem Relation Age of Onset  . Other Brother        substance abuse  . Hyperlipidemia Maternal Grandfather   . Anxiety disorder Maternal Grandfather   . Alcoholism Maternal Grandfather   . Anxiety disorder Mother   . ADD / ADHD Father   . Drug abuse Father   . ADD / ADHD Brother   . Other Brother        bone marrow transplant  . Breast cancer Paternal Grandmother   . Aplastic anemia Brother   . Hypertension Maternal Grandmother   . Alcoholism Maternal Grandmother   .  Diabetes Paternal Grandfather   . Cancer Paternal Grandfather   . Arrhythmia Brother   . Alcoholism Other   . Alcoholism Maternal Uncle     Review of Systems: All other systems reviewed and are otherwise negative except as noted above.   Physical Exam: VS:  BP 110/74   Pulse (!) 136   Ht 5\' 6"  (1.676 m)   Wt 110 lb (49.9 kg)   SpO2 96%   BMI 17.75 kg/m  , BMI Body mass index is 17.75 kg/m. Wt Readings from Last 3 Encounters:  12/30/19 110 lb (49.9 kg)  12/20/19 118 lb (53.5 kg)  12/13/19 113 lb 14.4 oz (51.7 kg)    GEN- The patient is well appearing, alert and oriented x 3 today.   HEENT: normocephalic, atraumatic; sclera clear, conjunctiva pink; hearing intact; neck supple  Lungs- normal work of breathing Extremities- no clubbing, cyanosis, or edema  MS- no significant deformity or atrophy Skin- warm and dry, no rash or lesion  Psych- euthymic mood, full affect   EKG:  EKG is not ordered today. Hospital EKGs reviewed and show ST with +delta wave  Recent Labs: 10/19/2019: B Natriuretic Peptide 146.1 10/29/2019: Magnesium 1.6 12/20/2019: ALT 15; BUN 12; Creatinine, Ser 0.86; Hemoglobin 8.2; Platelets 254; Potassium 3.8; Sodium 139    Other studies Reviewed: Additional studies/ records that were reviewed today include: Dr Tanna Furry office notes   Assessment and Plan:  1.  WPW S/p ablation with return of antegrade conduction  over right lateral AP No evidence of SVT or atrial fibrillation  2.  Palpitations/ dizziness Historically have been 2/2 sinus tachycardia CPX in the past demonstrated deconditioning I think she likely has a component of orthostatic intolerance. I have encouraged adequate hydration, compression hose, isometric exercises (information booklet given today)  3.  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  Ongoing substance abuse Started on Soboxone last week at PCP visit      Current medicines are reviewed at length with the patient today.   The patient does not have concerns regarding her medicines.  The following changes were made today:  none  Labs/ tests ordered today include: none No orders of the defined types were placed in this encounter.     Signed, Chanetta Marshall, NP 12/30/2019 11:54 AM   [place] [address] [place] [address] [phone] (office) [phone] (fax)

## 2020-01-11 ENCOUNTER — Other Ambulatory Visit: Payer: Self-pay

## 2020-01-11 ENCOUNTER — Telehealth: Payer: Self-pay | Admitting: Infectious Diseases

## 2020-01-11 DIAGNOSIS — Z5181 Encounter for therapeutic drug level monitoring: Secondary | ICD-10-CM

## 2020-01-11 NOTE — Telephone Encounter (Signed)
I called Suzanne [name] to discuss recent MyChart and check in on her with sudden loss of her husband/significant other to addiction. She is having a "really rough time."   We spent time discussing further how she has been feeling and coping with loss. Will get her in to see our counselor Marcie Bal on Thursday to help with substance support and grief counseling. Offered walk in information to [facility] for urgent appt, however she politely declined and looks forward to meeting with Marcie Bal Thursday.   Her shoulder has been feeling better up until recently. Doing a bit better on the doxycycline PO. Wonders if the Suboxone is causing some of her nausea and inquires about Subutex. I gave her the number to the Internal Medicine clinic to have her call for an appointment to discuss options for opioid dependence treatment.   I asked her to please resume her Lexapro now that she is off the Linezolid. She was thankful to be able to do that.    She is scheduled with Dr. West Bali on Monday and will keep that appointment to ensure

## 2020-01-12 ENCOUNTER — Telehealth: Payer: Self-pay | Admitting: *Deleted

## 2020-01-12 LAB — C-REACTIVE PROTEIN: CRP: 0.7 mg/L (ref <8.0)

## 2020-01-12 LAB — CBC
HCT: 31.5 % — ABNORMAL LOW (ref 35.0–45.0)
Hemoglobin: 9.9 g/dL — ABNORMAL LOW (ref 11.7–15.5)
MCH: 28.2 pg (ref 27.0–33.0)
MCHC: 31.4 g/dL — ABNORMAL LOW (ref 32.0–36.0)
MCV: 89.7 fL (ref 80.0–100.0)
MPV: 10.4 fL (ref 7.5–12.5)
Platelets: 599 10*3/uL — ABNORMAL HIGH (ref 140–400)
RBC: 3.51 10*6/uL — ABNORMAL LOW (ref 3.80–5.10)
RDW: 18.6 % — ABNORMAL HIGH (ref 11.0–15.0)
WBC: 6.4 10*3/uL (ref 3.8–10.8)

## 2020-01-12 LAB — SEDIMENTATION RATE: Sed Rate: 6 mm/h (ref 0–20)

## 2020-01-12 NOTE — Telephone Encounter (Signed)
-----   Message from Hansell Callas, NP sent at 01/12/2020  1:06 PM EST ----- Please let Suzanne [name] know that her labs look much much much better. The inflammation she had in her blood from the infection has all resolved finally.  Her blood counts are improving also (hgb 9.9 now). Great news for her.  Please have her continue her current course of doxycycline as we last planned (should have only a few days left now).  It is up to her if she wants to keep her appointment with Collene Mares on 12/06 for an earlier check in, but if they want to wait until her regular appointment with me I am comfortable with that. Will be on the look out for her echo results in the interim either way.

## 2020-01-12 NOTE — Telephone Encounter (Signed)
Reached out to patient to see how she is doing. She has been staying with a friend, said she had a better night last night.  She said she is safe, contracted for safety and will keep her appointment with Marcie Bal 12/2.  Relayed lab results and interpretation per [place].  Patient was pleased. She is scheduled for echo on 12/3.   Per Colletta Maryland, she will keep the 12/6 appointment with Dr West Bali in case she leaves for [place] before the 12/15 appointment with [facility].  If she leaves later in the month, she will just see Colletta Maryland on 12/16.  Landis Gandy, RN

## 2020-01-13 ENCOUNTER — Ambulatory Visit: Payer: Self-pay

## 2020-01-13 ENCOUNTER — Other Ambulatory Visit: Payer: Self-pay

## 2020-01-14 ENCOUNTER — Ambulatory Visit (HOSPITAL_COMMUNITY)
Admission: RE | Admit: 2020-01-14 | Discharge: 2020-01-14 | Disposition: A | Discharge status: Home / Self Care | Payer: Self-pay | Source: Ambulatory Visit | Attending: Infectious Diseases | Admitting: Infectious Diseases

## 2020-01-14 ENCOUNTER — Telehealth: Payer: Self-pay | Admitting: Internal Medicine

## 2020-01-14 DIAGNOSIS — I079 Rheumatic tricuspid valve disease, unspecified: Secondary | ICD-10-CM | POA: Insufficient documentation

## 2020-01-14 DIAGNOSIS — I361 Nonrheumatic tricuspid (valve) insufficiency: Secondary | ICD-10-CM

## 2020-01-14 LAB — ECHOCARDIOGRAM COMPLETE
Area-P 1/2: 4.86 cm2
S' Lateral: 3.3 cm

## 2020-01-14 NOTE — Progress Notes (Signed)
  Echocardiogram 2D Echocardiogram has been performed.  Suzanne [name] 01/14/2020, 1:49 PM

## 2020-01-14 NOTE — Telephone Encounter (Signed)
No dpr on file allowing [place] to speak w/ pt's mother. Called pt for permission, mentioned mom called asking for a sooner appt.   Pt states that no, she does NOT need an earlier appt and that she wants to see ID on Monday first. She would like to keep her currently scheduled appt next Friday at our office.

## 2020-01-14 NOTE — Telephone Encounter (Signed)
° °  Pt's mother calling, she would like for Dr. Lovena Le know, pt was seen by Amber on 11/18 and had an echo today. She also said pt have an appt to see infectious disease doctor at [facility] on Monday 12/16 at 3:15pm she is wondering if Dr. Lovena Le can give his recommendations and if he can see pt sooner than 12/10 for f/u

## 2020-01-17 ENCOUNTER — Ambulatory Visit (INDEPENDENT_AMBULATORY_CARE_PROVIDER_SITE_OTHER): Payer: Self-pay | Admitting: Infectious Diseases

## 2020-01-17 ENCOUNTER — Telehealth: Payer: Self-pay | Admitting: Orthopedic Surgery

## 2020-01-17 ENCOUNTER — Encounter: Payer: Self-pay | Admitting: Infectious Diseases

## 2020-01-17 ENCOUNTER — Other Ambulatory Visit: Payer: Self-pay

## 2020-01-17 VITALS — BP 135/97 | HR 123 | Temp 98.0°F | Resp 16 | Ht 66.0 in | Wt 111.2 lb

## 2020-01-17 DIAGNOSIS — M00011 Staphylococcal arthritis, right shoulder: Secondary | ICD-10-CM

## 2020-01-17 DIAGNOSIS — Z23 Encounter for immunization: Secondary | ICD-10-CM

## 2020-01-17 DIAGNOSIS — I330 Acute and subacute infective endocarditis: Secondary | ICD-10-CM

## 2020-01-17 DIAGNOSIS — B9561 Methicillin susceptible Staphylococcus aureus infection as the cause of diseases classified elsewhere: Secondary | ICD-10-CM

## 2020-01-17 NOTE — Telephone Encounter (Signed)
Pt would like a refill of her Celebrex muscle relaxer and would like to be notified when its sent to the [facility] on [address] please   [phone]

## 2020-01-17 NOTE — Progress Notes (Signed)
[facility]                                                             [address], [place], [place], [address]                                                                  Phn. 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 MSSA TV endocarditis- Has completed more than 6 weeks of treatment ( Initially 10-11 days of IV cefazolin followed by Oral Linezolid then Doxycycline). Blood cultures cleared on 10/22/19. TTE 12/3 with no evidence of prior vegetations. No signs and symptoms of CHF  2. Rt shoulder septic arthritis s/p OR on 10/9 with excisional debridement and placement of antibiotic beads. OR cx NGTD- Completed more than 6 weeks of therapy with Linezolid and Doxycycline with normalization if inflammatory markers. Rt shoulder pain is stable  3. IVDU - counseled  4. Immunization - she says she has received both doses of COVID vaccines   Plan -Patient completed PO Doxycyline few days prior and given patient has completed adequate therapy for endocarditis and septic arthritis, no indication for antibiotics at this time -She will follow up with Dr Marlou Sa for repair of rotator cuff tear -Has a Fu appt with Marcie Bal for counseling -Surveillance blood cx scheduled for next week -She already has a FU appt. with NP Janene Madeira next week  All questions and concerns were discussed. Patient and mother verbalised understanding of the plan.   ____________________________________________________________________________________________________________________  HPI: Suzanne [name] is a 29 y.o. female diagnosed ADHD, Depression who is being followed OP by NP Janene Madeira for recent h/o MSSA TV  endocarditis and possible  Rt shoulder septic arthritis.  In brief, patient was hospitalized  9/7-9/17 when patient was admitted for MSSA bacteremia/TV endocarditis and septic pulmonary emboli. Patient was deemed not a surgical candidate for percutaneous angiovac procedure by CT Sx. Hospital course was further complicated by GIB. Patient was sent home with linezolid PO for 4 weeks . Patient was again admitted 10/8-10/10 for Rt shoulder septic arthritis. Underwent Rt shoulder excisional debridement on 10/9 with placement of stimulan antibiotic leads. OR cx NGTD. Patient left AMA on 11/21/19 and was given Linezolid.  Patient says she has been taking Linezolid as instructed except for a week when it was making her sick   at which time she was switched to Doxycycline (11/18) which she finished taking few days ago. She feels well. Denies fever, chills, sweats. Denies  chest pain/SOB and cough. Complains of Rt shoulder pain and intensity is 5/10 which is stable. No erythema/tenderness. She has not used any IVD since September.  She is accompanied by her mother who says that her significant other died of fentanyl overdose 2 weeks ago and is grieving. She says she is not going to use IVD anymore. She is planning to go to [place] for a change of place for sometime and is planning to see Dr Marlou Sa before that which I agree.    ROS: Constitutional: Negative for fever, chills, activity change, appetite change, fatigue and unexpected weight change.  HENT: Negative for congestion, sore throat, rhinorrhea, sneezing, trouble swallowing and sinus pressure.  Eyes: Negative for photophobia and visual disturbance.  Respiratory: Negative for cough, chest tightness, shortness of breath, wheezing and stridor.  Cardiovascular: Negative for chest pain, palpitations and leg swelling.  Gastrointestinal: Negative for nausea, vomiting, abdominal pain, diarrhea, constipation, blood in stool, abdominal distention and anal bleeding.  Genitourinary: Negative for  dysuria, hematuria, flank pain and difficulty urinating.  Musculoskeletal: Negative for myalgias, back pain, joint swelling, and gait problem. RT SHOULDER PAIN, MINIMAL RESTRICTED ROM OF RT SHOULDER  Skin: Negative for color change, pallor, rash and wound.  Neurological: Negative for dizziness, tremors, weakness and light-headedness.  Hematological: Negative for adenopathy. Does not bruise/bleed easily.  Psychiatric/Behavioral: Negative for behavioral problems, confusion, sleep disturbance, dysphoric mood, decreased concentration and agitation.   Past Medical History:  Diagnosis Date  . 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 Complication of anesthesia   . Depression   . Lipoma of neck 04/2017   posterior  . PONV (postoperative nausea and vomiting)   . Scoliosis    idiopathic   . Sepsis ([place])   . WPW (Wolff-Parkinson-White syndrome)    Past Surgical History:  Procedure Laterality Date  . BIOPSY  10/23/2019   Procedure: BIOPSY;  Surgeon: Juanita Craver, MD;  Location: [facility] ENDOSCOPY;  Service: Endoscopy;;  . COLPOSCOPY  01/2010 & 02/2011    both were negative   . ELECTROPHYSIOLOGIC STUDY N/A 05/17/2015   SVT ablation by Dr Curt Bears  . ESOPHAGOGASTRODUODENOSCOPY (EGD) WITH PROPOFOL N/A 10/23/2019   Procedure: ESOPHAGOGASTRODUODENOSCOPY (EGD) WITH PROPOFOL;  Surgeon: Juanita Craver, MD;  Location: [facility] ENDOSCOPY;  Service: Endoscopy;  Laterality: N/A;  . LIPOMA EXCISION N/A 05/07/2017   Procedure: EXCISION LIPOMA POSTERIOR NECK ERAS PATHWAY;  Surgeon: Erroll Luna, MD;  Location: [place];  Service: General;  Laterality: N/A;  . SHOULDER ARTHROSCOPY Right 11/19/2019   Procedure: RIGHT SHOULDER ARTHROSCOPY, DEBRIDEMENT STIMULAN BEAD PLACEMENT;  Surgeon: Meredith Pel, MD;  Location: [place];  Service: Orthopedics;  Laterality: Right;  . TEE WITHOUT CARDIOVERSION N/A 10/22/2019   Procedure: TRANSESOPHAGEAL ECHOCARDIOGRAM (TEE);  Surgeon: Jerline Pain, MD;   Location: [facility] ENDOSCOPY;  Service: Cardiovascular;  Laterality: N/A;  . WISDOM TOOTH EXTRACTION     Current Outpatient Medications on File Prior to Visit  Medication Sig Dispense Refill  . amphetamine-dextroamphetamine (ADDERALL) 15 MG tablet Take by mouth.    . escitalopram (LEXAPRO) 20 MG tablet Take by mouth.    . gabapentin (NEURONTIN) 300 MG capsule Take by mouth.    . celecoxib (CELEBREX) 100 MG capsule Take 1 capsule (100 mg total) by mouth 2 (two) times daily. 60 capsule 0  . doxycycline (VIBRA-TABS) 100 MG tablet Take 1 tablet (100 mg total) by mouth 2 (two) times daily. 12 tablet 0  . gabapentin (NEURONTIN) 400 MG capsule Take 1 capsule (400 mg  total) by mouth at bedtime. 90 capsule 1  . linezolid (ZYVOX) 600 MG tablet Take 1 tablet (600 mg total) by mouth 2 (two) times daily. 60 tablet 0  . LORazepam (ATIVAN) 1 MG tablet Take 1 mg by mouth 2 (two) times daily.    . methocarbamol (ROBAXIN) 500 MG tablet Take 1 tablet (500 mg total) by mouth every 6 (six) hours as needed for muscle spasms. 30 tablet 0  . norethindrone (MICRONOR) 0.35 MG tablet Take 1 tablet by mouth daily.    . ondansetron (ZOFRAN ODT) 4 MG disintegrating tablet Take 1 tablet (4 mg total) by mouth every 8 (eight) hours as needed for nausea or vomiting. 30 tablet 0  . pantoprazole (PROTONIX) 40 MG tablet Take 1 tablet (40 mg total) by mouth 2 (two) times daily for 30 days, THEN 1 tablet (40 mg total) daily. 60 tablet 1  . promethazine (PHENERGAN) 25 MG tablet Take 1 tablet (25 mg total) by mouth every 6 (six) hours as needed for up to 4 days for nausea or vomiting. 15 tablet 0   No current facility-administered medications on file prior to visit.   No Known Allergies  Social History   Socioeconomic History  . Marital status: Single    Spouse name: Not on file  . Number of children: Not on file  . Years of education: Not on file  . Highest education level: Not on file  Occupational History  . Not on file   Tobacco Use  . Smoking status: Former Smoker    Packs/day: 0.00    Years: 7.00    Pack years: 0.00    Types: Cigarettes  . Smokeless tobacco: Never Used  . Tobacco comment: vapes daily   Vaping Use  . Vaping Use: Every day  Substance and Sexual Activity  . Alcohol use: Not Currently    Alcohol/week: 1.0 standard drink    Types: 1 Glasses of wine per week  . Drug use: Not Currently    Types: Marijuana, IV, Heroin    Comment: last used 04/13/2017  . Sexual activity: Yes    Partners: Male    Birth control/protection: None  Other Topics Concern  . 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 Difficulty of Paying Living Expenses: Not on file  Food Insecurity:   . Worried About Charity fundraiser in the Last Year: Not on file  . Ran Out of Food in the Last Year: Not on file  Transportation Needs:   . Lack of Transportation (Medical): Not on file  . Lack of Transportation (Non-Medical): Not on file  Physical Activity:   . Days of Exercise per Week: Not on file  . Minutes of Exercise per Session: Not on file  Stress:   . Feeling of Stress : Not on file  Social Connections:   . Frequency of Communication with Friends and Family: Not on file  . Frequency of Social Gatherings with Friends and Family: Not on file  . Attends Religious Services: Not on file  . Active Member of Clubs or Organizations: Not on file  . Attends Archivist Meetings: Not on file  . Marital Status: Not on file  Intimate Partner Violence:   . Fear of Current or Ex-Partner: Not on file  . Emotionally Abused: Not on file  .  Physically Abused: Not on file  . Sexually Abused: Not on file    Vitals BP (!) 135/97   Pulse (!) 123   Temp 98 F (36.7 C)   Resp 16   Ht 5\' 6"  (1.676 m)   Wt 111 lb 3.2 oz (50.4 kg)   LMP 11/17/2019   SpO2 99%   BMI 17.95 kg/m     Examination  General - not in acute distress, comfortably sitting in chair HEENT - PEERLA, no pallor and no icterus Chest - b/l clear air entry, no additional sounds CVS- Normal s1s2, RRR Abdomen - Soft, Non tender , non distended Ext- no pedal edema RT SHOULDER: NO ERYTHEMA, TENDERNESS. SWELLING WITH MINIMAL RESTRICTED ROM Neuro: grossly normal Back - WNL Psych : calm and cooperative   Recent labs CBC Latest Ref Rng & Units 01/11/2020 12/20/2019 12/13/2019  WBC 3.8 - 10.8 Thousand/uL 6.4 4.7 3.9(L)  Hemoglobin 11.7 - 15.5 g/dL 9.9(L) 8.2(L) 7.6(L)  Hematocrit 35 - 45 % 31.5(L) 26.6(L) 23.6(L)  Platelets 140 - 400 Thousand/uL 599(H) 254 334   CMP Latest Ref Rng & Units 12/20/2019 11/19/2019 10/29/2019  Glucose 70 - 99 mg/dL 115(H) 91 93  BUN 6 - 20 mg/dL 12 11 9   Creatinine 0.44 - 1.00 mg/dL 0.86 0.63 0.69  Sodium 135 - 145 mmol/L 139 138 135  Potassium 3.5 - 5.1 mmol/L 3.8 4.2 4.9  Chloride 98 - 111 mmol/L 105 106 99  CO2 22 - 32 mmol/L 21(L) 21(L) 25  Calcium 8.9 - 10.3 mg/dL 9.6 9.1 9.0  Total Protein 6.5 - 8.1 g/dL 7.2 6.8 7.1  Total Bilirubin 0.3 - 1.2 mg/dL 0.6 0.5 0.2(L)  Alkaline Phos 38 - 126 U/L 69 85 78  AST 15 - 41 U/L 20 17 27   ALT 0 - 44 U/L 15 12 22      Pertinent Microbiology Results for orders placed or performed during the hospital encounter of 12/20/19  Respiratory Panel by RT PCR (Flu A&B, Covid) - Nasopharyngeal Swab     Status: None   Collection Time: 12/20/19 11:40 AM   Specimen: Nasopharyngeal Swab  Result Value Ref Range Status   SARS Coronavirus 2 by RT PCR NEGATIVE NEGATIVE Final    Comment: (NOTE) SARS-CoV-2 target nucleic acids are NOT DETECTED.  The SARS-CoV-2 RNA is generally detectable in upper respiratoy specimens during the acute phase of infection. The lowest concentration of SARS-CoV-2 viral copies this assay can detect is 131 copies/mL. A negative result does not preclude SARS-Cov-2 infection and should not be used as the sole  basis for treatment or other patient management decisions. A negative result may occur with  improper specimen collection/handling, submission of specimen other than nasopharyngeal swab, presence of viral mutation(s) within the areas targeted by this assay, and inadequate number of viral copies (<131 copies/mL). A negative result must be combined with clinical observations, patient history, and epidemiological information. The expected result is Negative.  Fact Sheet for Patients:  PinkCheek.be  Fact Sheet for Healthcare Providers:  GravelBags.it  This test is no t yet approved or cleared by the [place] FDA and  has been authorized for detection and/or diagnosis of SARS-CoV-2 by FDA under an Emergency Use Authorization (EUA). This EUA will remain  in effect (meaning this test can be used) for the duration of the COVID-19 declaration under Section 564(b)(1) of the Act, 21 U.S.C. section 360bbb-3(b)(1), unless the authorization is terminated or revoked sooner.     Influenza A by PCR NEGATIVE NEGATIVE Final  Influenza B by PCR NEGATIVE NEGATIVE Final    Comment: (NOTE) The Xpert Xpress SARS-CoV-2/FLU/RSV assay is intended as an aid in  the diagnosis of influenza from Nasopharyngeal swab specimens and  should not be used as a sole basis for treatment. Nasal washings and  aspirates are unacceptable for Xpert Xpress SARS-CoV-2/FLU/RSV  testing.  Fact Sheet for Patients: PinkCheek.be  Fact Sheet for Healthcare Providers: GravelBags.it  This test is not yet approved or cleared by the [place] FDA and  has been authorized for detection and/or diagnosis of SARS-CoV-2 by  FDA under an Emergency Use Authorization (EUA). This EUA will remain  in effect (meaning this test can be used) for the duration of the  Covid-19 declaration under Section 564(b)(1) of the  Act, 21  U.S.C. section 360bbb-3(b)(1), unless the authorization is  terminated or revoked. Performed at [facility], [place] [address], [place], [place] [address]      Pertinent Imaging Chest Xray 12/20/19  FINDINGS: The heart size and mediastinal contours are within normal limits. No consolidation. No visible pleural effusions or pneumothorax. Biapical pleuroparenchymal scarring. The visualized skeletal structures are unremarkable.  IMPRESSION: No radiographic evidence of acute cardiopulmonary disease.  MRI RT shoulder 11/17/19 FINDINGS: Rotator cuff: Full-thickness retracted rotator cuff tear. Maximum retraction of the supraspinatus tendon is 22 mm. The infraspinatus tendon is retracted 24 mm. The subscapularis tendon is still intact and the teres minor tendon is intact.  Muscles: Edema and enhancement in the anterior and lateral deltoid muscles suggesting myositis. No findings for pyomyositis.  Biceps long head:  Intact  Acromioclavicular Joint: No significant degenerative changes. I do not see any findings suspicious for septic arthritis at the joint. Type 2 acromion. No lateral downsloping or undersurface spurring. Abnormal T2 signal intensity in the acromion and subsequent irregular enhancement worrisome for osteomyelitis.  Glenohumeral Joint: Moderate-sized joint effusion with thick synovial enhancement worrisome for septic arthritis. Small amount of inflammatory debris noted in the joint.  Labrum:  No definite labral tears.  Bones: Abnormal T1 and T2 signal intensity and abnormal areas of enhancement in the humeral head suspicious for osteomyelitis. Do not see any definite findings for osteomyelitis involving the glenoid.  Other: Large complex rim enhancing fluid collection in the subacromial/subdeltoid bursa and to a lesser extent in the subcoracoid bursa consistent with septic bursitis.  IMPRESSION: 1. Full-thickness retracted rotator cuff  tear likely due to infectious tendinopathy. 2. Findings consistent with septic arthritis involving the glenohumeral joint. There is also osteomyelitis involving the humeral head and the acromion. 3. Septic bursitis involving the subacromial/subdeltoid bursa and also to a lesser extent the subcoracoid bursa. 4. Myositis involving the anterior and lateral deltoid muscles. No findings for pyomyositis.  TTE 01/14/2020  IMPRESSIONS    1. Left ventricular ejection fraction, by estimation, is 60 to 65%. The  left ventricle has normal function. The left ventricle has no regional  wall motion abnormalities. Left ventricular diastolic parameters were  normal.  2. Right ventricular systolic function is normal. The right ventricular  size is normal. There is normal pulmonary artery systolic pressure.  3. The mitral valve is normal in structure. No evidence of mitral valve  regurgitation. No evidence of mitral stenosis.  4. There appears to be a discrete flail segment of the lateral portion of  the anterior tricuspid leaflet, in the area of a previous vegetation.  Cannot exclude a superimposed small (4-5 mm) healed vegetation as well.  The TR jet is eccentric, directed  towards the  septum/coronary sinus ostium. Color Doppler probably  underestimates its true severity. On CW Doppler, the TR jet spectrum  appears triangular, suggesting severe TR. Tricuspid valve regurgitation is  severe.  5. The aortic valve is normal in structure. Aortic valve regurgitation is  not visualized. No aortic stenosis is present.  6. The inferior vena cava is normal in size with greater than 50%  respiratory variability, suggesting right atrial pressure of 3 mmHg.   Comparison(s): Prior images reviewed side by side. The tricuspid valve  vegetation is no longer seen, but there appears to be a small area of  anterior tricuspid leaflet flail motion with at least moderate, possibly  severe tricuspid  insufficiency.   All pertinent labs/Imagings/notes reviewed. All pertinent plain films and CT images have been personally visualized and interpreted; radiology reports have been reviewed. Decision making incorporated into the Impression / Recommendations.  I spent greater than 25 minutes with the patient including  review of prior medical records with greater than 50% of time in face to face counsel of the patient.    Electronically signed by:  Rosiland Oz, MD Infectious Disease Physician [facility] [address] [place], [place] [address] Phone: [phone]  Fax: [phone]

## 2020-01-17 NOTE — Telephone Encounter (Signed)
Please advise 

## 2020-01-18 ENCOUNTER — Ambulatory Visit: Payer: Self-pay

## 2020-01-18 ENCOUNTER — Other Ambulatory Visit: Payer: Self-pay | Admitting: Surgical

## 2020-01-18 DIAGNOSIS — Z23 Encounter for immunization: Secondary | ICD-10-CM | POA: Insufficient documentation

## 2020-01-18 DIAGNOSIS — B9561 Methicillin susceptible Staphylococcus aureus infection as the cause of diseases classified elsewhere: Secondary | ICD-10-CM | POA: Insufficient documentation

## 2020-01-18 MED ORDER — CELECOXIB 100 MG PO CAPS: 100.0000 mg | ORAL_CAPSULE | Freq: Two times a day (BID) | ORAL | 0 refills | Status: AC

## 2020-01-18 NOTE — Progress Notes (Unsigned)
Mental Health Therapist Progress Note   Name: Suzanne [name]  Total time: 60  Type of Service: Individual Outpatient Mental Health Therapy  OBJECTIVE:  Mood: Depressed and Affect: Appropriate Risk of harm to self or others: Suicidal ideation  DIAGNOSIS: major Depressive Disorder, Recurrent, Severe   GOALS ADDRESSED:  Patient will: 1.  Reduce symptoms of: depression  2.  Increase knowledge and/or ability of: coping skills  3.  Demonstrate ability to: Begin healthy grieving over loss and increase coping skills for depression.  INTERVENTIONS: Interventions utilized:  Freight forwarder met with patient for outpatient mental health individual therapy to include ongoing assessment, support, and reinforcement.  Therapist allowed patient to "check in" since previous session; asking patient to share any positive coping skills they may have used over the previous week, along with any challenges faced.  Therapist provided supportive listening as patient processed their thoughts, emotional responses, and behaviors surrounding several stressors.  EFFECTIVENESS/PLAN: Client was alert, oriented x3, with no SI, HI, or symptoms of psychosis (risk low).  Client was pleasant and friendly, engaging openly and appropriately with therapist, benefiting from supportive listening and exploration of feelings.  Next session recommended in one to two weeks.   Hughes Better, LCSW

## 2020-01-18 NOTE — Assessment & Plan Note (Signed)
Has completed adequate therapy for TV endocarditis with TTE showing no prior vegetations No further indication of abx currently Surveillance blood cx next week

## 2020-01-18 NOTE — Assessment & Plan Note (Signed)
Has received both doses of COVID vaccine, counseled on booster

## 2020-01-18 NOTE — Telephone Encounter (Signed)
Sent in RX, she needs followup appointment too

## 2020-01-18 NOTE — Telephone Encounter (Signed)
I left voicemail advising. Patient has follow up appointment scheduled for 01/21/2020.

## 2020-01-18 NOTE — Assessment & Plan Note (Signed)
ESR and CRP WNL Pain stable No signs and symptoms of septic joint currently FU with Dr Marlou Sa for repair of RC tear

## 2020-01-20 ENCOUNTER — Other Ambulatory Visit: Payer: Self-pay

## 2020-01-20 DIAGNOSIS — I079 Rheumatic tricuspid valve disease, unspecified: Secondary | ICD-10-CM

## 2020-01-20 NOTE — Progress Notes (Deleted)
Cardiology Office Note Date:  01/20/2020  Patient ID:  Suzanne [name], Suzanne [name] 1991-01-30, MRN [id number] PCP:  Sanjuan Dame, MD  Electrophysiologist: Dr. Curt Bears   Chief Complaint:  *** ???  History of Present Illness: Suzanne [name] is a 29 y.o. female with history of WPW, ADD, .  Underwent EPS, ablation 2017.  She comes in today to be seen for Dr. Lovena Le, last seen by him 2019, he noted she was found to have recurrence of the antegrade conduction of her pathway and remains pre-excited. The patient has had problems with palpitations due to sinus tachycardia and episodes of dizziness. No frank syncope. She also c/o dyspnea and underwent cardiopulmonary stress testing which demonstrated that she was deconditioned. In the interim, she remains sedentary. She uses stimulants for her ADD. She smokes [place] and admits to drinking ETOH in excess. He felt her palpitations were 2/2 ST, no recurrence of AFib/SVT. She was urged to stop drinking and to start exercising.  More recently saw A. Lynnell Jude, NP 12/30/2019, noted September of 2021 she was admitted for chest pain and fever.  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  That hospitalization was complicated by GI bleeding and she unable to undergo debulking of tricuspid valve. She was treated with IV antibiotics. She then was admitted in October of this year with right shoulder infection and was found to have septic arthritis.   Since that time, she continues to struggle with deconditioning, dizziness, and pre-syncope with standing.  Her mom is on speakerphone during the visit today.  She stopped her antibiotics 2/2 nausea and vomiting. She has follow up scheduled with ID, and ewas recently started on suboxone Amber suspected a component of orthostatic intolerance and recommended adequate hydration, compression wear, etc.  She had an echo done 01/14/20, her mom called requesting Dr. Tanna Furry thoughts/recommendations of echo findings. She  saw ID on 01/17/20,  Plan -Patient completed PO Doxycyline few days prior and given patient has completed adequate therapy for endocarditis and septic arthritis, no indication for antibiotics at this time -She will follow up with Dr Marlou Sa for repair of rotator cuff tear -Has a Fu appt with Marcie Bal for counseling -Surveillance blood cx scheduled for next week -She already has a FU appt. with NP Janene Madeira next week  *** symptoms? *** LV/RV good, severe TR, >> CTS eval for TR? Dr. Kipp Brood *** palps, WPW *** syncope *** recovery *** IVDU?   Past Medical History:  Diagnosis Date  . 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 Complication of anesthesia   . Depression   . Lipoma of neck 04/2017   posterior  . PONV (postoperative nausea and vomiting)   . Scoliosis    idiopathic   . Sepsis ([place])   . WPW (Wolff-Parkinson-White syndrome)     Past Surgical History:  Procedure Laterality Date  . BIOPSY  10/23/2019   Procedure: BIOPSY;  Surgeon: Juanita Craver, MD;  Location: [facility] ENDOSCOPY;  Service: Endoscopy;;  . COLPOSCOPY  01/2010 & 02/2011    both were negative   . ELECTROPHYSIOLOGIC STUDY N/A 05/17/2015   SVT ablation by Dr Curt Bears  . ESOPHAGOGASTRODUODENOSCOPY (EGD) WITH PROPOFOL N/A 10/23/2019   Procedure: ESOPHAGOGASTRODUODENOSCOPY (EGD) WITH PROPOFOL;  Surgeon: Juanita Craver, MD;  Location: [facility] ENDOSCOPY;  Service: Endoscopy;  Laterality: N/A;  . LIPOMA EXCISION N/A 05/07/2017   Procedure: EXCISION LIPOMA POSTERIOR NECK ERAS PATHWAY;  Surgeon: Erroll Luna, MD;  Location: [place];  Service: General;  Laterality:  N/A;  . SHOULDER ARTHROSCOPY Right 11/19/2019   Procedure: RIGHT SHOULDER ARTHROSCOPY, DEBRIDEMENT STIMULAN BEAD PLACEMENT;  Surgeon: Meredith Pel, MD;  Location: [place];  Service: Orthopedics;  Laterality: Right;  . TEE WITHOUT CARDIOVERSION N/A 10/22/2019   Procedure: TRANSESOPHAGEAL ECHOCARDIOGRAM (TEE);  Surgeon: Jerline Pain, MD;  Location: [facility] ENDOSCOPY;  Service: Cardiovascular;  Laterality: N/A;  . WISDOM TOOTH EXTRACTION      Current Outpatient Medications  Medication Sig Dispense Refill  . amphetamine-dextroamphetamine (ADDERALL) 15 MG tablet Take by mouth.    . celecoxib (CELEBREX) 100 MG capsule Take 1 capsule (100 mg total) by mouth 2 (two) times daily. 60 capsule 0  . doxycycline (VIBRA-TABS) 100 MG tablet Take 1 tablet (100 mg total) by mouth 2 (two) times daily. 12 tablet 0  . escitalopram (LEXAPRO) 20 MG tablet Take by mouth.    Marland Kitchen LORazepam (ATIVAN) 1 MG tablet Take 1 mg by mouth 2 (two) times daily.    . ondansetron (ZOFRAN ODT) 4 MG disintegrating tablet Take 1 tablet (4 mg total) by mouth every 8 (eight) hours as needed for nausea or vomiting. 30 tablet 0  . promethazine (PHENERGAN) 25 MG tablet Take 1 tablet (25 mg total) by mouth every 6 (six) hours as needed for up to 4 days for nausea or vomiting. 15 tablet 0   No current facility-administered medications for this visit.    Allergies:   Patient has no known allergies.   Social History:  The patient  reports that she has quit smoking. Her smoking use included cigarettes. She smoked 0.00 packs per day for 7.00 years. She has never used smokeless tobacco. She reports previous alcohol use of about 1.0 standard drink of alcohol per week. She reports previous drug use. Drugs: Marijuana, IV, and Heroin.   Family History:  The patient's family history includes ADD / ADHD in her brother and father; Alcoholism in her maternal grandfather, maternal grandmother, maternal uncle, and another family member; Anxiety disorder in her maternal grandfather and mother; Aplastic anemia in her brother; Arrhythmia in her brother; Breast cancer in her paternal grandmother; Cancer in her paternal grandfather; Diabetes in her paternal grandfather; Drug abuse in her father; Hyperlipidemia in her maternal grandfather; Hypertension in her maternal grandmother; Other in her  brother and brother. She has a brother who had WPW ablated   ROS:  Please see the history of present illness.    All other systems are reviewed and otherwise negative.   PHYSICAL EXAM:  VS:  There were no vitals taken for this visit. BMI: There is no height or weight on file to calculate BMI. Well nourished, well developed, in no acute distress  HEENT: normocephalic, atraumatic  Neck: no JVD, carotid bruits or masses Cardiac:  *** RRR; no significant murmurs, no rubs, or gallops Lungs:  *** CTA b/l, no wheezing, rhonchi or rales  Abd: soft, nontender MS: no deformity or atrophy Ext: *** no edema  Skin: warm and dry, no rash Neuro:  No gross deficits appreciated Psych: euthymic mood, full affect   EKG:  Done today and reviewed by myself: ***   01/14/2020: TTE IMPRESSIONS  1. Left ventricular ejection fraction, by estimation, is 60 to 65%. The  left ventricle has normal function. The left ventricle has no regional  wall motion abnormalities. Left ventricular diastolic parameters were  normal.  2. Right ventricular systolic function is normal. The right ventricular  size is normal. There is normal pulmonary artery systolic pressure.  3. The  mitral valve is normal in structure. No evidence of mitral valve  regurgitation. No evidence of mitral stenosis.  4. There appears to be a discrete flail segment of the lateral portion of  the anterior tricuspid leaflet, in the area of a previous vegetation.  Cannot exclude a superimposed small (4-5 mm) healed vegetation as well.  The TR jet is eccentric, directed  towards the septum/coronary sinus ostium. Color Doppler probably  underestimates its true severity. On CW Doppler, the TR jet spectrum  appears triangular, suggesting severe TR. Tricuspid valve regurgitation is  severe.  5. The aortic valve is normal in structure. Aortic valve regurgitation is  not visualized. No aortic stenosis is present.  6. The inferior vena cava is  normal in size with greater than 50%  respiratory variability, suggesting right atrial pressure of 3 mmHg.   Comparison(s): Prior images reviewed side by side. The tricuspid valve  vegetation is no longer seen, but there appears to be a small area of  anterior tricuspid leaflet flail motion with at least moderate, possibly  severe tricuspid insufficiency.    05/17/2015: EPS/ablation w/Dr. Curt Bears CONCLUSIONS:  1. Sinus rhythm upon presentation.  2. The patient had a delta wave with ORT 3. Successful radiofrequency modification of the right sided accessory pathway  4. No inducible arrhythmias following ablation.  5. No early apparent complications    Recent Labs: 10/19/2019: B Natriuretic Peptide 146.1 10/29/2019: Magnesium 1.6 12/20/2019: ALT 15; BUN 12; Creatinine, Ser 0.86; Potassium 3.8; Sodium 139 01/11/2020: Hemoglobin 9.9; Platelets 599  No results found for requested labs within last 8760 hours.   CrCl cannot be calculated (Patient's most recent lab result is older than the maximum 21 days allowed.).   Wt Readings from Last 3 Encounters:  01/17/20 111 lb 3.2 oz (50.4 kg)  12/30/19 110 lb (49.9 kg)  12/20/19 118 lb (53.5 kg)     Other studies reviewed: Additional studies/records reviewed today include: summarized above  ASSESSMENT AND PLAN:  1. WPW     S/p ablation 2017     return of antegrade conduction over right lateral AP     ***   2. TV endocarditis 3. Septic joint     2/2 IVDU     Completed antibiotics     cleared cultures, saw ID last week     severe TR w/small flail segment     ***   Disposition: ***   Current medicines are reviewed at length with the patient today.  The patient did not have any concerns regarding medicines.  Haywood Lasso, PA-C 01/20/2020 6:29 AM     [address] [place] [address] [phone] (office)  [phone] (fax)

## 2020-01-21 ENCOUNTER — Ambulatory Visit: Payer: Medicaid Other | Admitting: Physician Assistant

## 2020-01-21 ENCOUNTER — Ambulatory Visit: Payer: Medicaid Other | Admitting: Surgical

## 2020-01-24 ENCOUNTER — Other Ambulatory Visit: Payer: Medicaid Other

## 2020-01-26 ENCOUNTER — Ambulatory Visit: Payer: Medicaid Other | Admitting: Infectious Diseases

## 2020-01-27 ENCOUNTER — Ambulatory Visit: Payer: Medicaid Other

## 2020-08-15 ENCOUNTER — Encounter: Payer: Self-pay | Admitting: *Deleted

## 2021-01-09 ENCOUNTER — Encounter: Payer: Medicaid Other | Admitting: Student

## 2021-05-07 ENCOUNTER — Encounter: Payer: Self-pay | Admitting: Student

## 2021-07-14 IMAGING — DX DG CHEST 2V
2 series · 2 of 2 positions shown · non-contrast
Comparison: None.

CLINICAL DATA: Chest pain.  Nausea and shortness of breath

EXAM:
CHEST - 2 VIEW

[w chest pa]
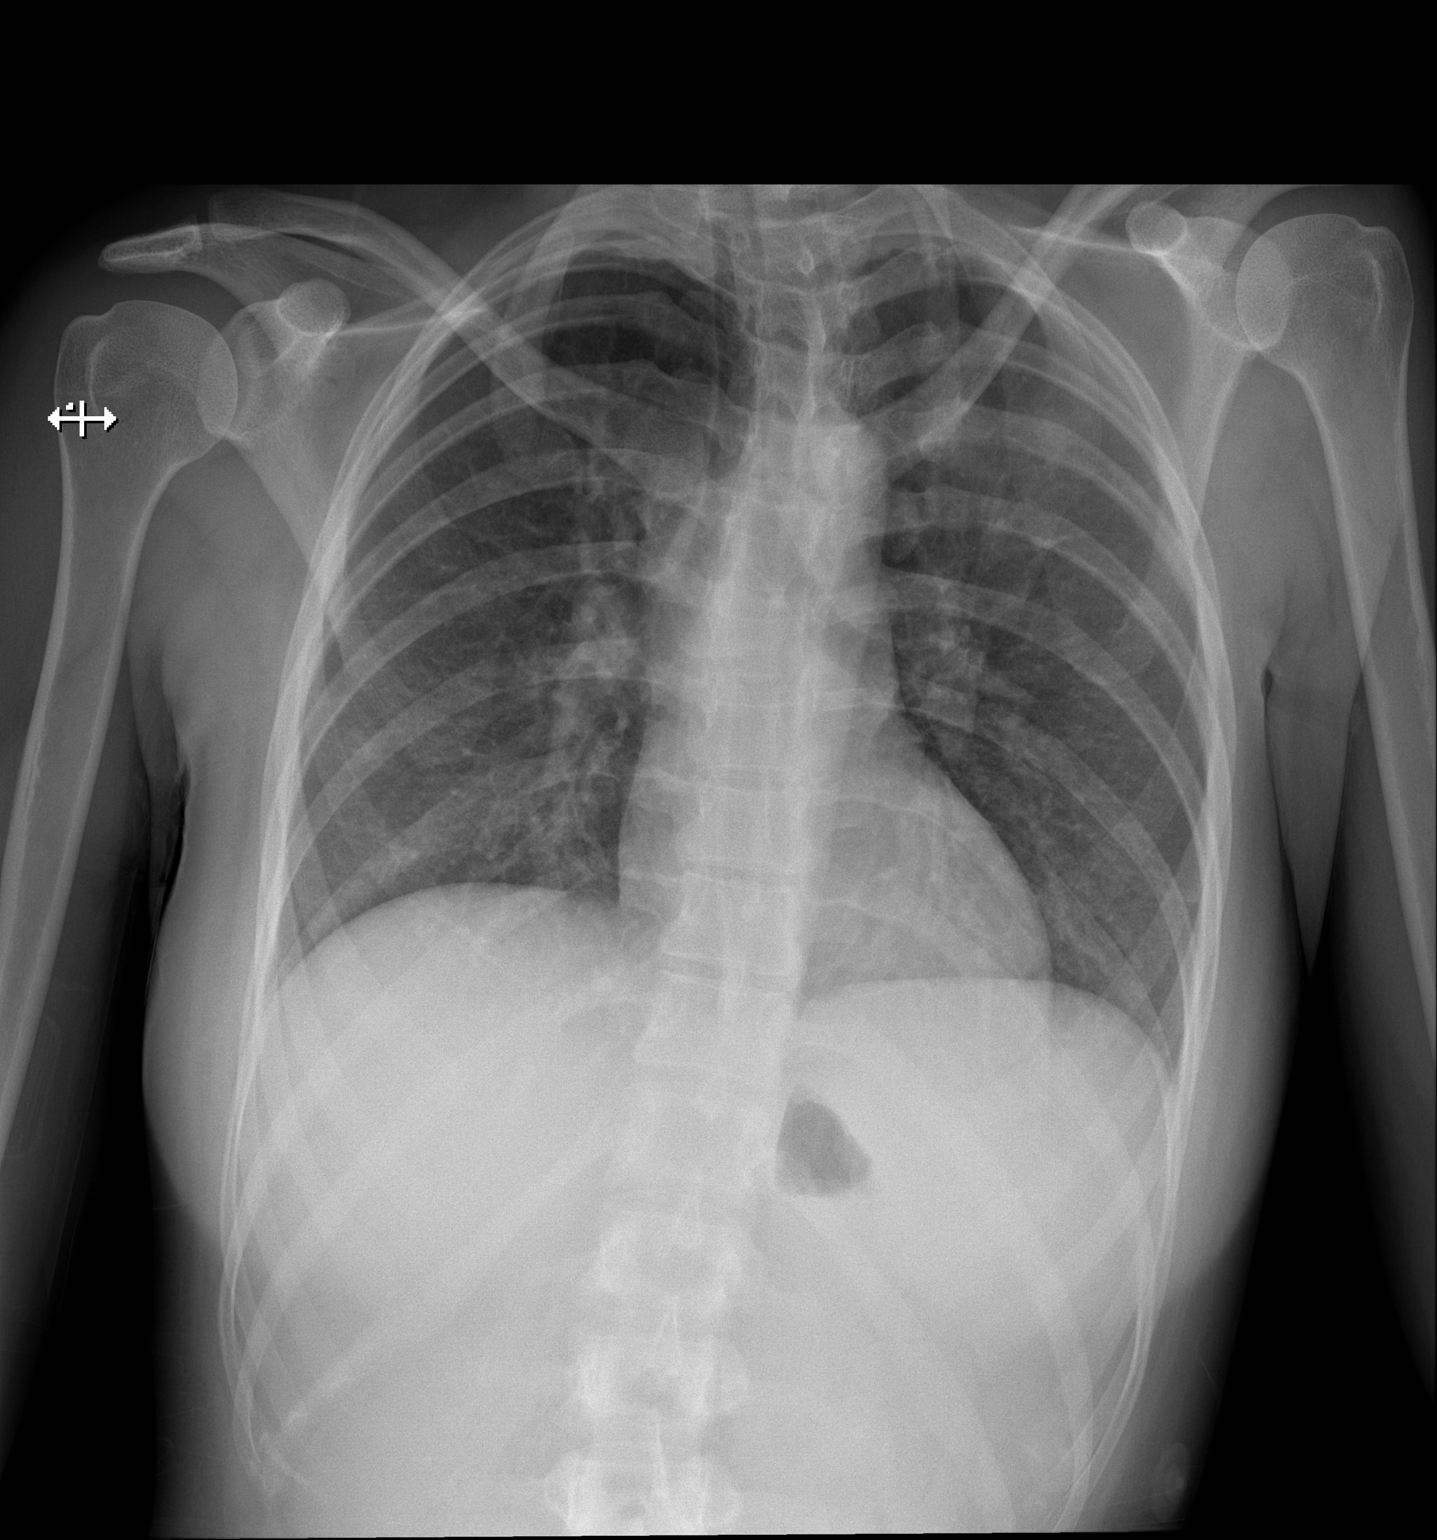

[w chest lat]
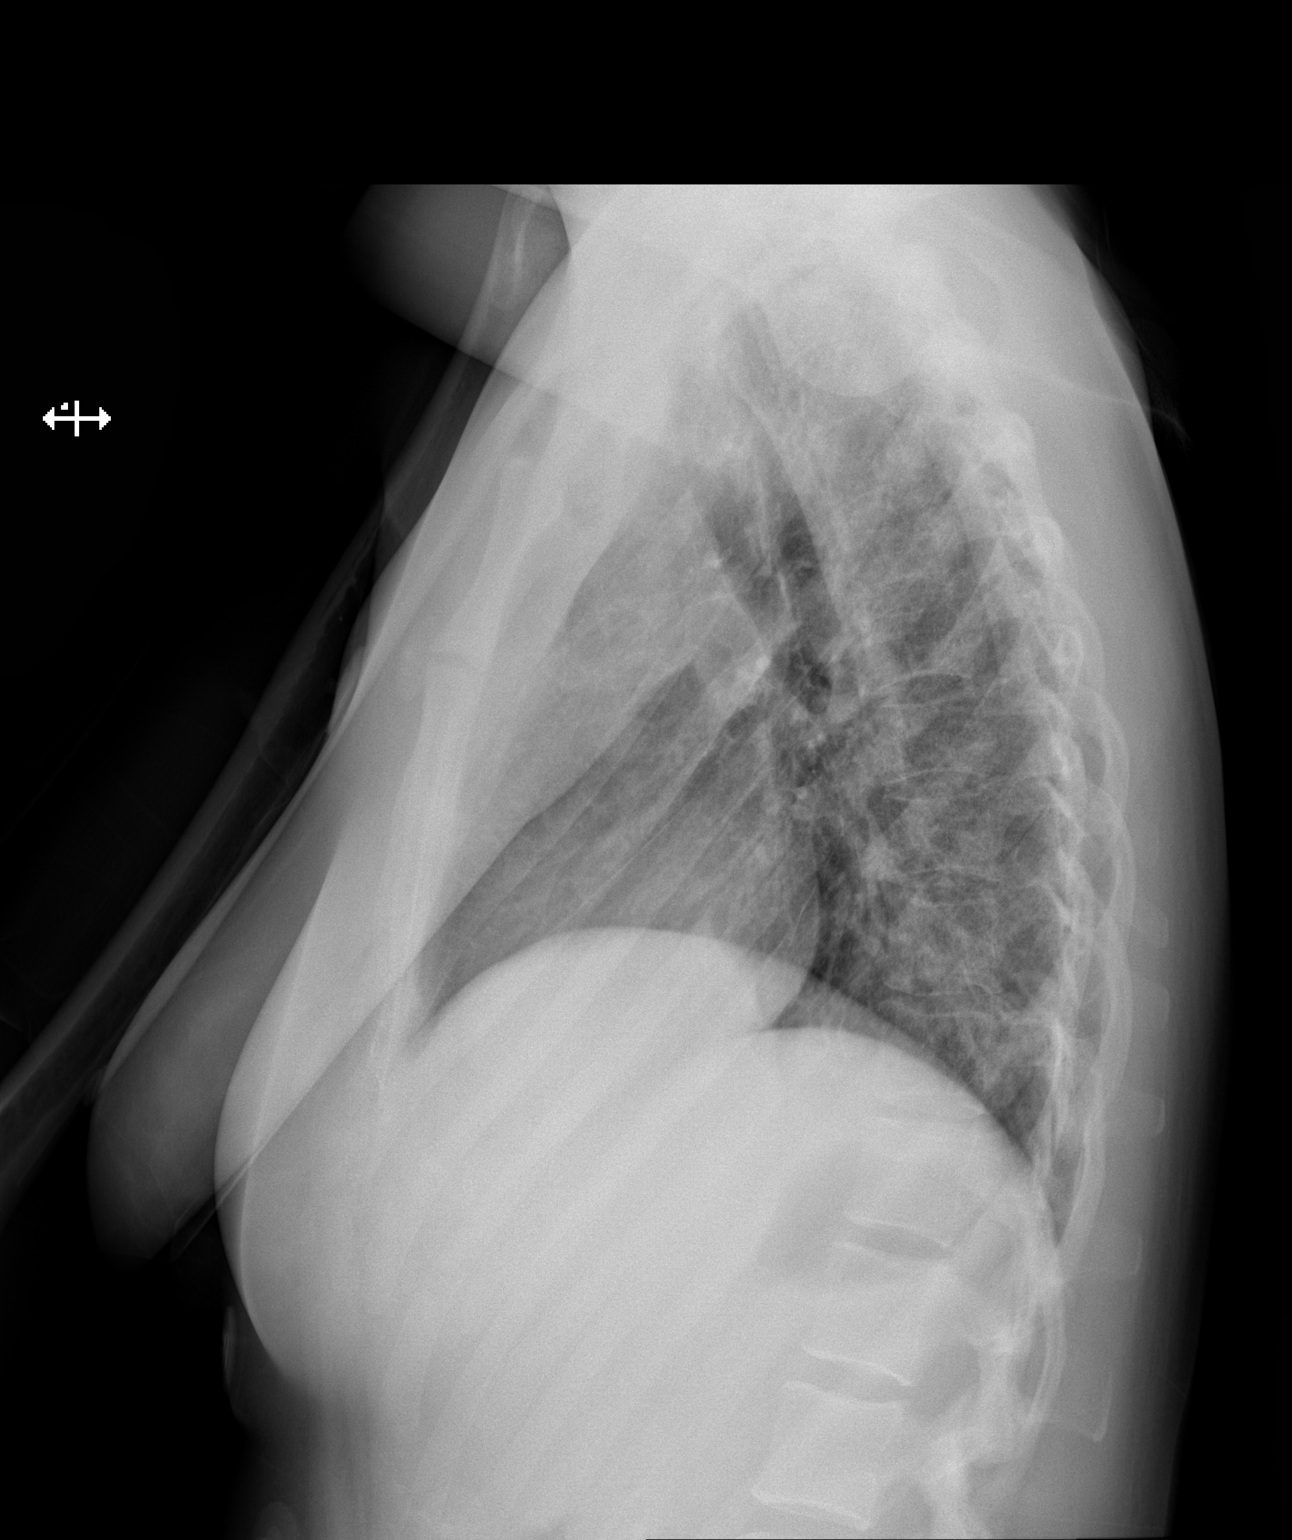

[2 of 2 positions shown; findings below may reference images not displayed]

FINDINGS: Lateral view degraded by patient arm position. Midline trachea.
Normal heart size and mediastinal contours. No pleural effusion or
pneumothorax. Clear lungs. Biapical pleural thickening.
IMPRESSION: No acute cardiopulmonary disease.

## 2021-07-24 IMAGING — US US SOFT TISSUE
1 series · 13 of 15 positions shown · non-contrast
Comparison: CT angio chest 10/19/2019

CLINICAL DATA: RIGHT chest wall tenderness for several weeks
question drainable abscess in the setting of bacteremia and
endocarditis

EXAM:
ULTRASOUND OF HEAD/NECK SOFT TISSUES
TECHNIQUE: Ultrasound examination of the head and neck soft tissues was
performed in the area of clinical concern overlying the sternum, and
extending slightly to the LEFT.

[Series 1: us chest soft tissue · 15 acquisitions, 13 frames shown]
[im 1/15]
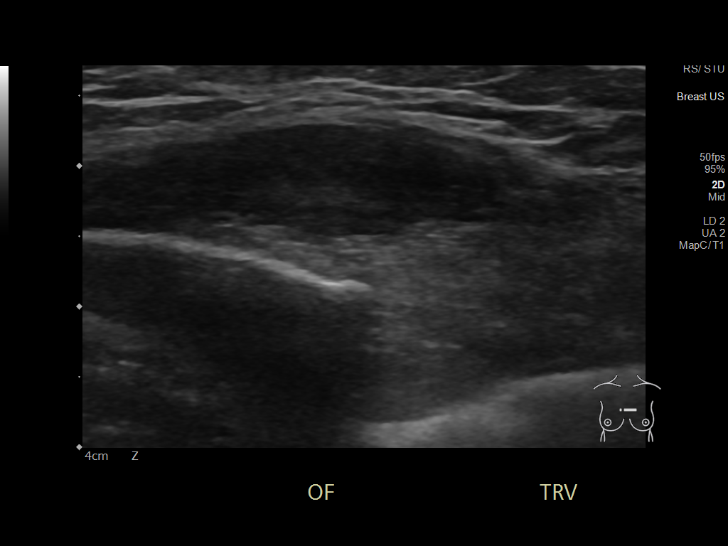
[im 2/15]
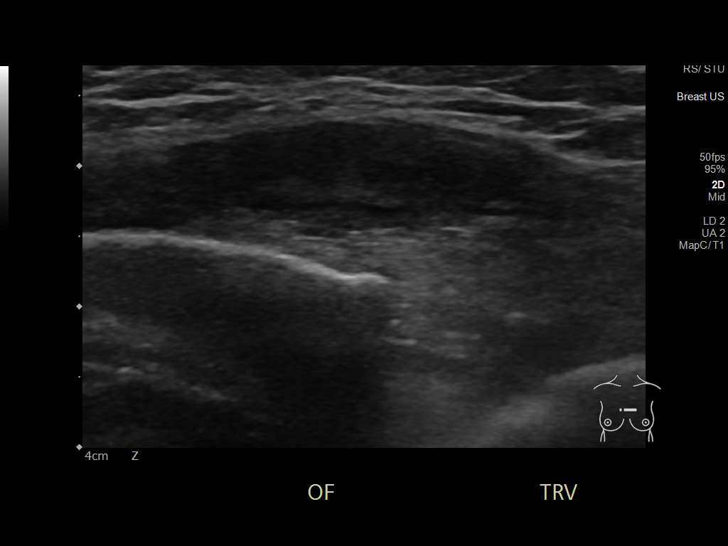
[im 3/15]
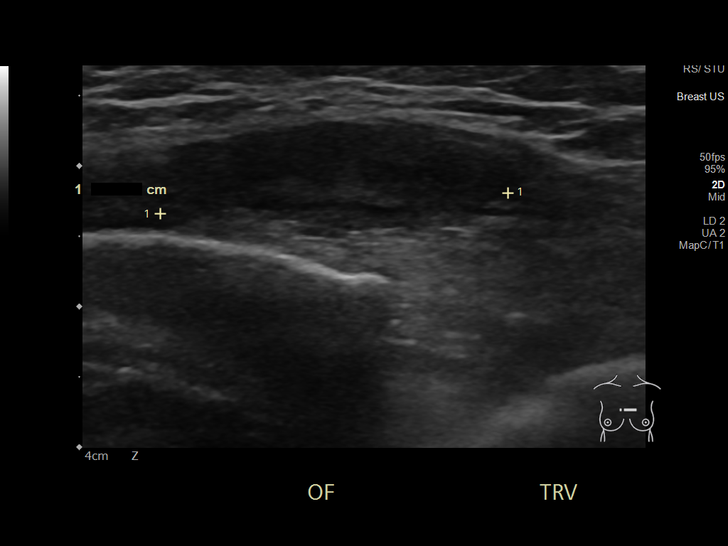
[im 5/15]
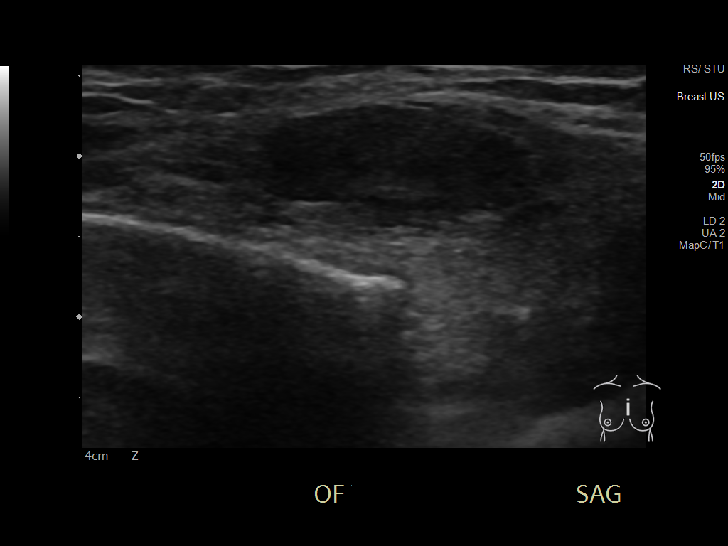
[im 6/15]
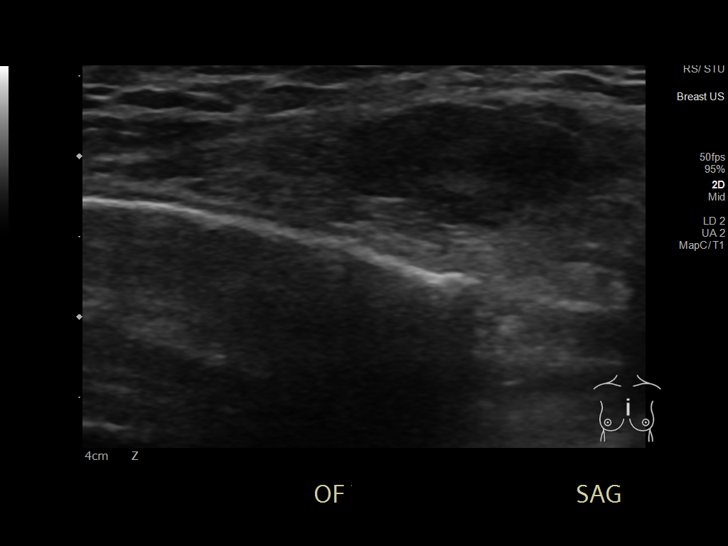
[im 7/15]
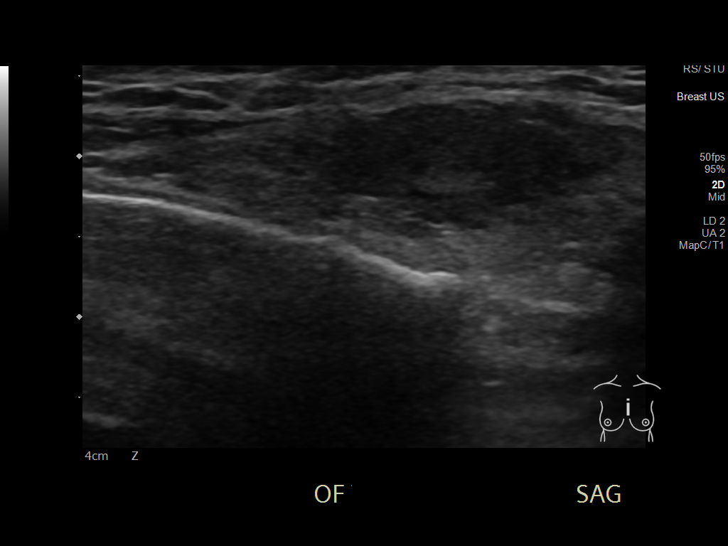
[im 8/15]
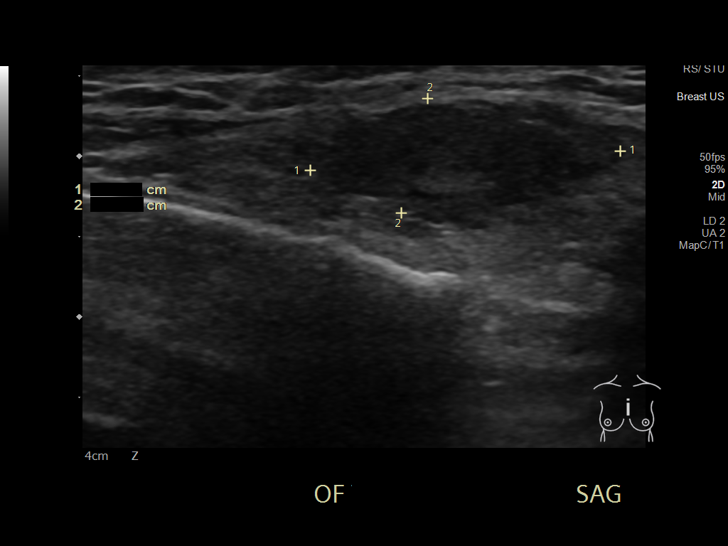
[im 9/15]
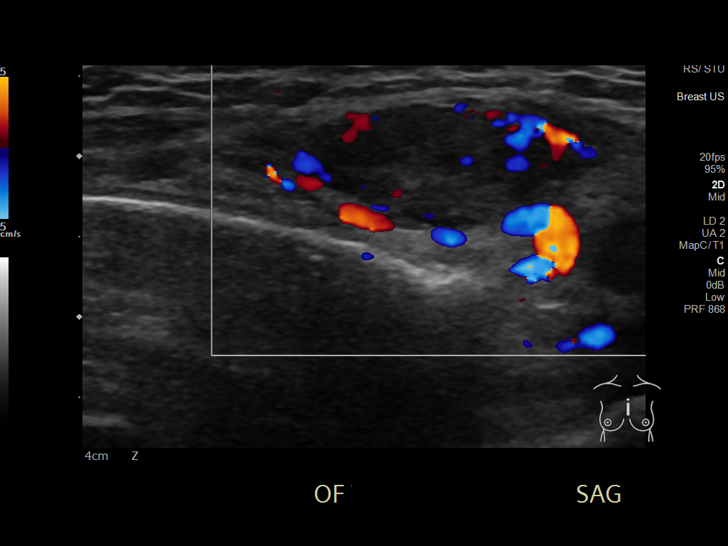
[im 10/15]
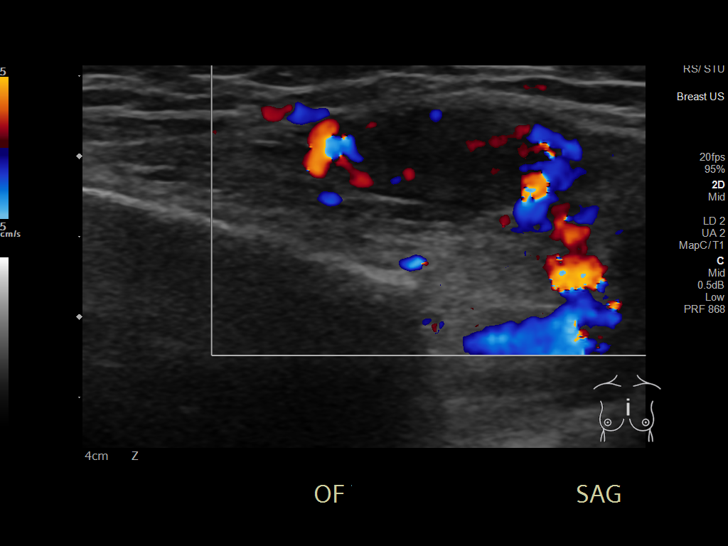
[im 11/15]
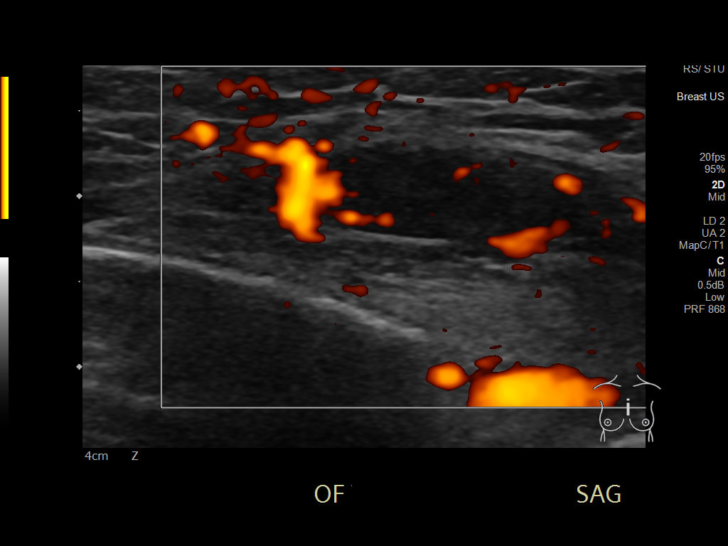
[im 13/15]
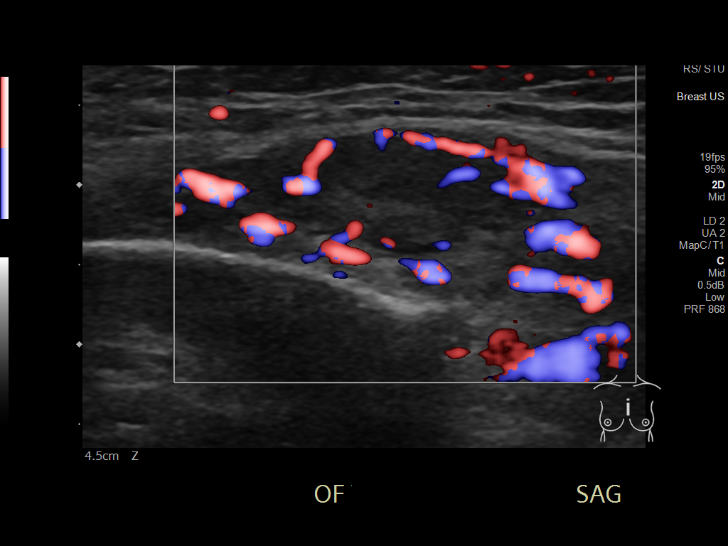
[im 14/15]
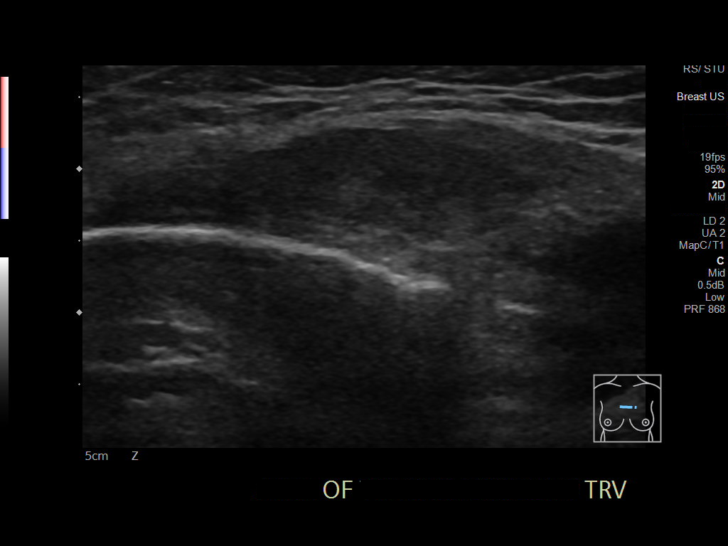
[im 15/15]
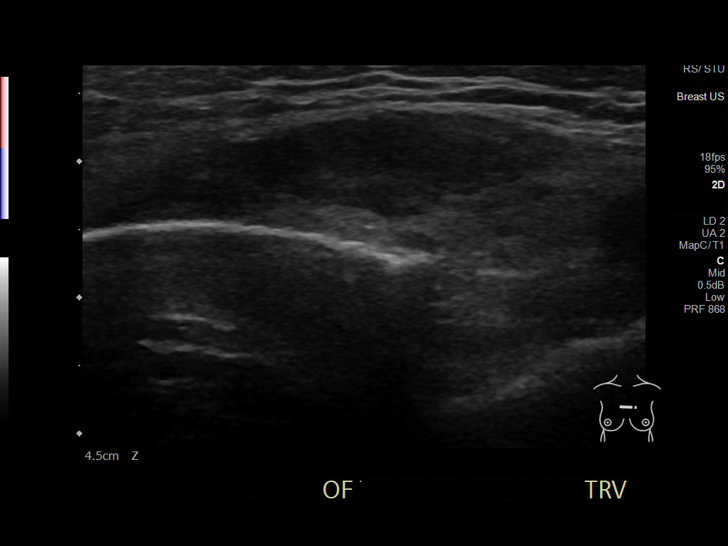

[13 of 15 positions shown; findings below may reference images not displayed]

FINDINGS: At the site of clinical concern, a lenticular hypoechoic structure
is identified anterior to the sternum measuring 19 x 25 mm in size,
7 mm thick.

Lesion appears to be located within muscles overlying the sternum,
likely pectoralis major where they meet at the midline.

Recent CT exam demonstrates thickening of presternal muscular planes
anterior to the sternum/sternomanubrial junction.

The posterior aspects of the muscular planes appear normal, with the
masslike area located anteriorly.

Increased blood flow is seen on color Doppler imaging surrounding
lesion, and questionably within the margins of the lesion itself.
IMPRESSION: Lenticular hypoechoic mass within the muscles anterior to the
sternum likely pectoralis major measuring 19 x 25 x 7 mm in size
with surrounding hypervascularity.

Lesion has nonspecific imaging characteristics.

Sonographic appearance is more suggestive of a solid lesion,
nonspecific soft tissue tumor such as a fibroma, does not appear to
represent fat/lipoma by CT; cannot completely exclude organizing
hematoma or infection, though this does not appear to represent a
drainable fluid collection at this time.

## 2022-07-02 NOTE — Telephone Encounter (Signed)
This encounter was created in error - please disregard.
